# Patient Record
Sex: Female | Born: 1985 | Race: Black or African American | Hispanic: No | Marital: Single | Smoking: Former smoker
Health system: Southern US, Community
[De-identification: ages and names within clinical notes are randomized; demographics above are authoritative.]

## PROBLEM LIST (undated history)

## (undated) ENCOUNTER — Ambulatory Visit (HOSPITAL_COMMUNITY): Source: Home / Self Care

## (undated) DIAGNOSIS — B20 Human immunodeficiency virus [HIV] disease: Secondary | ICD-10-CM

## (undated) DIAGNOSIS — Z789 Other specified health status: Secondary | ICD-10-CM

## (undated) DIAGNOSIS — J40 Bronchitis, not specified as acute or chronic: Secondary | ICD-10-CM

## (undated) DIAGNOSIS — Z21 Asymptomatic human immunodeficiency virus [HIV] infection status: Secondary | ICD-10-CM

## (undated) DIAGNOSIS — J45909 Unspecified asthma, uncomplicated: Secondary | ICD-10-CM

## (undated) HISTORY — PX: DILATION AND CURETTAGE, DIAGNOSTIC / THERAPEUTIC: SUR384

## (undated) HISTORY — PX: NO PAST SURGERIES: SHX2092

---
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
## 2007-10-19 ENCOUNTER — Emergency Department (HOSPITAL_COMMUNITY): Admission: EM | Admit: 2007-10-19 | Discharge: 2007-10-19 | Payer: Self-pay | Admitting: Emergency Medicine

## 2008-05-04 ENCOUNTER — Emergency Department (HOSPITAL_COMMUNITY): Admission: EM | Admit: 2008-05-04 | Discharge: 2008-05-04 | Payer: Self-pay | Admitting: *Deleted

## 2008-10-28 ENCOUNTER — Emergency Department (HOSPITAL_COMMUNITY): Admission: EM | Admit: 2008-10-28 | Discharge: 2008-10-28 | Payer: Self-pay | Admitting: Emergency Medicine

## 2009-02-01 ENCOUNTER — Emergency Department (HOSPITAL_COMMUNITY): Admission: EM | Admit: 2009-02-01 | Discharge: 2009-02-02 | Payer: Self-pay | Admitting: Emergency Medicine

## 2009-03-10 ENCOUNTER — Emergency Department (HOSPITAL_COMMUNITY): Admission: EM | Admit: 2009-03-10 | Discharge: 2009-03-10 | Payer: Self-pay | Admitting: Emergency Medicine

## 2009-05-31 ENCOUNTER — Emergency Department (HOSPITAL_COMMUNITY): Admission: EM | Admit: 2009-05-31 | Discharge: 2009-05-31 | Payer: Self-pay | Admitting: Emergency Medicine

## 2009-09-06 ENCOUNTER — Emergency Department (HOSPITAL_COMMUNITY): Admission: EM | Admit: 2009-09-06 | Discharge: 2009-09-06 | Payer: Self-pay | Admitting: Emergency Medicine

## 2009-09-11 ENCOUNTER — Emergency Department (HOSPITAL_COMMUNITY): Admission: EM | Admit: 2009-09-11 | Discharge: 2009-09-12 | Payer: Self-pay | Admitting: Emergency Medicine

## 2010-02-26 ENCOUNTER — Emergency Department (HOSPITAL_COMMUNITY): Admission: EM | Admit: 2010-02-26 | Discharge: 2010-02-26 | Payer: Self-pay | Admitting: Emergency Medicine

## 2010-09-02 LAB — URINALYSIS, ROUTINE W REFLEX MICROSCOPIC
Bilirubin Urine: NEGATIVE
Glucose, UA: NEGATIVE mg/dL
Hgb urine dipstick: NEGATIVE
Ketones, ur: NEGATIVE mg/dL
Nitrite: NEGATIVE
Protein, ur: NEGATIVE mg/dL
Specific Gravity, Urine: 1.024 (ref 1.005–1.030)
Urobilinogen, UA: 0.2 mg/dL (ref 0.0–1.0)
pH: 7 (ref 5.0–8.0)

## 2010-09-02 LAB — POCT PREGNANCY, URINE: Preg Test, Ur: NEGATIVE

## 2010-09-21 LAB — POCT PREGNANCY, URINE: Preg Test, Ur: NEGATIVE

## 2010-09-24 LAB — MONONUCLEOSIS SCREEN: Mono Screen: POSITIVE — AB

## 2010-09-24 LAB — RAPID STREP SCREEN (MED CTR MEBANE ONLY): Streptococcus, Group A Screen (Direct): NEGATIVE

## 2010-09-25 LAB — URINE CULTURE: Colony Count: >=100000

## 2010-09-25 LAB — DIFFERENTIAL
Basophils Absolute: 0.1 10*3/uL (ref 0.0–0.1)
Basophils Relative: 1 % (ref 0–1)
Eosinophils Absolute: 0.3 10*3/uL (ref 0.0–0.7)
Eosinophils Relative: 4 % (ref 0–5)
Lymphocytes Relative: 25 % (ref 12–46)
Lymphs Abs: 1.6 10*3/uL (ref 0.7–4.0)
Monocytes Absolute: 0.8 10*3/uL (ref 0.1–1.0)
Monocytes Relative: 12 % (ref 3–12)
Neutro Abs: 3.8 10*3/uL (ref 1.7–7.7)
Neutrophils Relative %: 58 % (ref 43–77)

## 2010-09-25 LAB — COMPREHENSIVE METABOLIC PANEL
ALT: 11 U/L (ref 0–35)
AST: 20 U/L (ref 0–37)
Albumin: 4 g/dL (ref 3.5–5.2)
Alkaline Phosphatase: 59 U/L (ref 39–117)
BUN: 13 mg/dL (ref 6–23)
CO2: 26 mEq/L (ref 19–32)
Calcium: 9.6 mg/dL (ref 8.4–10.5)
Chloride: 105 mEq/L (ref 96–112)
Creatinine, Ser: 0.79 mg/dL (ref 0.4–1.2)
GFR calc Af Amer: >60 mL/min (ref >60)
GFR calc non Af Amer: >60 mL/min (ref >60)
Glucose, Bld: 71 mg/dL (ref 70–99)
Potassium: 4.5 mEq/L (ref 3.5–5.1)
Sodium: 137 mEq/L (ref 135–145)
Total Bilirubin: 0.6 mg/dL (ref 0.3–1.2)
Total Protein: 7.3 g/dL (ref 6.0–8.3)

## 2010-09-25 LAB — CBC
HCT: 40 % (ref 36.0–46.0)
Hemoglobin: 13.3 g/dL (ref 12.0–15.0)
MCHC: 33.3 g/dL (ref 30.0–36.0)
MCV: 90.2 fL (ref 78.0–100.0)
Platelets: 314 10*3/uL (ref 150–400)
RBC: 4.44 MIL/uL (ref 3.87–5.11)
RDW: 13.2 % (ref 11.5–15.5)
WBC: 6.6 10*3/uL (ref 4.0–10.5)

## 2010-09-25 LAB — POCT PREGNANCY, URINE: Preg Test, Ur: NEGATIVE

## 2010-09-25 LAB — URINALYSIS, ROUTINE W REFLEX MICROSCOPIC
Bilirubin Urine: NEGATIVE
Glucose, UA: NEGATIVE mg/dL
Hgb urine dipstick: NEGATIVE
Ketones, ur: NEGATIVE mg/dL
Leukocytes, UA: NEGATIVE
Nitrite: POSITIVE — AB
Protein, ur: NEGATIVE mg/dL
Specific Gravity, Urine: 1.025 (ref 1.005–1.030)
Urobilinogen, UA: 1 mg/dL (ref 0.0–1.0)
pH: 7.5 (ref 5.0–8.0)

## 2010-09-25 LAB — URINE MICROSCOPIC-ADD ON

## 2010-09-25 LAB — LIPASE, BLOOD: Lipase: 21 U/L (ref 11–59)

## 2010-10-02 ENCOUNTER — Emergency Department (HOSPITAL_COMMUNITY)
Admission: EM | Admit: 2010-10-02 | Discharge: 2010-10-02 | Disposition: A | Discharge status: Home / Self Care | Payer: Self-pay | Attending: Emergency Medicine | Admitting: Emergency Medicine

## 2010-10-02 DIAGNOSIS — J45909 Unspecified asthma, uncomplicated: Secondary | ICD-10-CM | POA: Insufficient documentation

## 2010-10-02 DIAGNOSIS — J309 Allergic rhinitis, unspecified: Secondary | ICD-10-CM | POA: Insufficient documentation

## 2010-10-02 DIAGNOSIS — I498 Other specified cardiac arrhythmias: Secondary | ICD-10-CM | POA: Insufficient documentation

## 2010-10-02 DIAGNOSIS — R0602 Shortness of breath: Secondary | ICD-10-CM | POA: Insufficient documentation

## 2011-03-22 LAB — COMPREHENSIVE METABOLIC PANEL
ALT: 10
AST: 15
Albumin: 3.6
Alkaline Phosphatase: 76
BUN: 10
CO2: 23
Calcium: 9.3
Chloride: 105
Creatinine, Ser: 0.71
GFR calc Af Amer: >60
GFR calc non Af Amer: >60
Glucose, Bld: 93
Potassium: 3.7
Sodium: 136
Total Bilirubin: 0.2 — ABNORMAL LOW
Total Protein: 7.6

## 2011-03-22 LAB — URINE CULTURE: Colony Count: >=100000

## 2011-03-22 LAB — URINALYSIS, ROUTINE W REFLEX MICROSCOPIC
Bilirubin Urine: NEGATIVE
Glucose, UA: NEGATIVE
Hgb urine dipstick: NEGATIVE
Ketones, ur: NEGATIVE
Nitrite: NEGATIVE
Protein, ur: NEGATIVE
Specific Gravity, Urine: 1.015
Urobilinogen, UA: 1
pH: 7

## 2011-03-22 LAB — LIPASE, BLOOD: Lipase: 23

## 2011-03-22 LAB — CBC
HCT: 37.9
Hemoglobin: 12.6
MCHC: 33.1
MCV: 88.1
Platelets: 409 — ABNORMAL HIGH
RBC: 4.31
RDW: 14.3
WBC: 9.3

## 2011-03-22 LAB — DIFFERENTIAL
Basophils Absolute: 0
Basophils Relative: 0
Eosinophils Absolute: 0.3
Eosinophils Relative: 3
Lymphocytes Relative: 21
Lymphs Abs: 2
Monocytes Absolute: 0.4
Monocytes Relative: 5
Neutro Abs: 6.6
Neutrophils Relative %: 71

## 2011-03-22 LAB — URINE MICROSCOPIC-ADD ON

## 2011-03-22 LAB — POCT PREGNANCY, URINE: Preg Test, Ur: NEGATIVE

## 2011-05-10 ENCOUNTER — Inpatient Hospital Stay (HOSPITAL_COMMUNITY)
Admission: AD | Admit: 2011-05-10 | Discharge: 2011-05-10 | Disposition: A | Discharge status: Home / Self Care | Payer: Self-pay | Source: Ambulatory Visit | Attending: Obstetrics & Gynecology | Admitting: Obstetrics & Gynecology

## 2011-05-10 ENCOUNTER — Encounter (HOSPITAL_COMMUNITY): Payer: Self-pay | Admitting: Obstetrics and Gynecology

## 2011-05-10 DIAGNOSIS — N949 Unspecified condition associated with female genital organs and menstrual cycle: Secondary | ICD-10-CM | POA: Insufficient documentation

## 2011-05-10 DIAGNOSIS — N390 Urinary tract infection, site not specified: Secondary | ICD-10-CM | POA: Insufficient documentation

## 2011-05-10 DIAGNOSIS — N898 Other specified noninflammatory disorders of vagina: Secondary | ICD-10-CM

## 2011-05-10 DIAGNOSIS — N938 Other specified abnormal uterine and vaginal bleeding: Secondary | ICD-10-CM | POA: Insufficient documentation

## 2011-05-10 DIAGNOSIS — N939 Abnormal uterine and vaginal bleeding, unspecified: Secondary | ICD-10-CM

## 2011-05-10 HISTORY — DX: Other specified health status: Z78.9

## 2011-05-10 LAB — URINALYSIS, ROUTINE W REFLEX MICROSCOPIC
Glucose, UA: NEGATIVE mg/dL
Ketones, ur: NEGATIVE mg/dL
Leukocytes, UA: NEGATIVE
Nitrite: POSITIVE — AB
Protein, ur: 100 mg/dL — AB
Specific Gravity, Urine: >1.03 — ABNORMAL HIGH (ref 1.005–1.030)
Urobilinogen, UA: 1 mg/dL (ref 0.0–1.0)
pH: 5.5 (ref 5.0–8.0)

## 2011-05-10 LAB — CBC
HCT: 40.2 % (ref 36.0–46.0)
Hemoglobin: 13.4 g/dL (ref 12.0–15.0)
MCH: 28.6 pg (ref 26.0–34.0)
MCHC: 33.3 g/dL (ref 30.0–36.0)
MCV: 85.7 fL (ref 78.0–100.0)
Platelets: 270 10*3/uL (ref 150–400)
RBC: 4.69 MIL/uL (ref 3.87–5.11)
RDW: 13.6 % (ref 11.5–15.5)
WBC: 5.4 10*3/uL (ref 4.0–10.5)

## 2011-05-10 LAB — HCG, SERUM, QUALITATIVE: Preg, Serum: NEGATIVE

## 2011-05-10 LAB — WET PREP, GENITAL
Clue Cells Wet Prep HPF POC: NONE SEEN
Trich, Wet Prep: NONE SEEN
Yeast Wet Prep HPF POC: NONE SEEN

## 2011-05-10 LAB — POCT PREGNANCY, URINE: Preg Test, Ur: NEGATIVE

## 2011-05-10 LAB — URINE MICROSCOPIC-ADD ON

## 2011-05-10 MED ORDER — NITROFURANTOIN MONOHYD MACRO 100 MG PO CAPS: 100.0000 mg | ORAL_CAPSULE | Freq: Two times a day (BID) | ORAL | Status: AC

## 2011-05-10 MED ORDER — KETOROLAC TROMETHAMINE 60 MG/2ML IM SOLN
60.0000 mg | Freq: Once | INTRAMUSCULAR | Status: AC
  Administered 2011-05-10: 60 mg via INTRAMUSCULAR
  Filled 2011-05-10: qty 2

## 2011-05-10 MED ORDER — NORGESTIMATE-ETH ESTRADIOL 0.25-35 MG-MCG PO TABS: 1.0000 | ORAL_TABLET | Freq: Every day | ORAL | Status: DC

## 2011-05-10 NOTE — ED Provider Notes (Signed)
History     Chief Complaint  Patient presents with  . Vaginal Bleeding   Patient is a 25 y.o. female presenting with vaginal bleeding. The history is provided by the patient.  Vaginal Bleeding This is a new problem. The current episode started today. Associated symptoms include abdominal pain. Pertinent negatives include no chills, fever, headaches or nausea.  Pt had PNMP 10/1 lasting 4 days and then started bleeding heavily this morning when woke up this morning bleeding through pajamas and pads changing every hour with clots and cramping at onset of period.  She has not taken any medications for the pain.  She is not using anything for birth control except condoms.  [facility]  She wants birth control but previously used BCP and missed one pill and got pregnant.      Past Medical History  Diagnosis Date  . No pertinent past medical history     Past Surgical History  Procedure Date  . No past surgeries     No family history on file.  History  Substance Use Topics  . Smoking status: Current Everyday Smoker -- 0.5 packs/day  . Smokeless tobacco: Not on file  . Alcohol Use: No    Allergies: Allergies not on file  No prescriptions prior to admission    Review of Systems  Constitutional: Negative for fever and chills.  Gastrointestinal: Positive for abdominal pain. Negative for heartburn and nausea.  Genitourinary: Positive for vaginal bleeding. Negative for dysuria, urgency and frequency.  Neurological: Negative for headaches.   Physical Exam   Blood pressure 108/55, pulse 76, temperature 98.8 F (37.1 C), temperature source Oral, resp. rate 20, height 5\' 6"  (1.676 m), weight 176 lb (79.833 kg), last menstrual period 03/25/2011.  Physical Exam  Nursing note and vitals reviewed. Constitutional: She is oriented to person, place, and time. She appears well-developed and well-nourished.  HENT:  Head: Normocephalic.  Eyes:  Pupils are equal, round, and reactive to light.  Neck: Normal range of motion. Neck supple.  Cardiovascular: Normal rate.   Respiratory: Effort normal.  GI: Soft. She exhibits no distension. There is no tenderness. There is no rebound and no guarding.  Genitourinary:       Mod amount of blood in vault; cervix clean NT; adnexal without palpable enlargement or tenderness  Musculoskeletal: Normal range of motion.  Neurological: She is alert and oriented to person, place, and time. She has normal reflexes.  Skin: Skin is warm and dry.    MAU Course  Procedures Results for orders placed during the hospital encounter of 05/10/11 (from the past 24 hour(s))  URINALYSIS, ROUTINE W REFLEX MICROSCOPIC     Status: Abnormal   Collection Time   05/10/11 12:20 PM      Component Value Range   Color, Urine RED (*) YELLOW    Appearance CLOUDY (*) CLEAR    Specific Gravity, Urine >1.030 (*) 1.005 - 1.030    pH 5.5  5.0 - 8.0    Glucose, UA NEGATIVE  NEGATIVE (mg/dL)   Hgb urine dipstick LARGE (*) NEGATIVE    Bilirubin Urine SMALL (*) NEGATIVE    Ketones, ur NEGATIVE  NEGATIVE (mg/dL)   Protein, ur 161 (*) NEGATIVE (mg/dL)   Urobilinogen, UA 1.0  0.0 - 1.0 (mg/dL)   Nitrite POSITIVE (*) NEGATIVE    Leukocytes, UA NEGATIVE  NEGATIVE   URINE MICROSCOPIC-ADD ON     Status: Abnormal   Collection  Time   05/10/11 12:20 PM      Component Value Range   Squamous Epithelial / LPF FEW (*) RARE    RBC / HPF TOO NUMEROUS TO COUNT  <3 (RBC/hpf)  POCT PREGNANCY, URINE     Status: Normal   Collection Time   05/10/11 12:48 PM      Component Value Range   Preg Test, Ur NEGATIVE    CBC     Status: Normal   Collection Time   05/10/11  1:43 PM      Component Value Range   WBC 5.4  4.0 - 10.5 (K/uL)   RBC 4.69  3.87 - 5.11 (MIL/uL)   Hemoglobin 13.4  12.0 - 15.0 (g/dL)   HCT 45.4  09.8 - 11.9 (%)   MCV 85.7  78.0 - 100.0 (fL)   MCH 28.6  26.0 - 34.0 (pg)   MCHC 33.3  30.0 - 36.0 (g/dL)   RDW 14.7  82.9 -  56.2 (%)   Platelets 270  150 - 400 (K/uL)  HCG, SERUM, QUALITATIVE     Status: Normal   Collection Time   05/10/11  1:43 PM      Component Value Range   Preg, Serum NEGATIVE  NEGATIVE   WET PREP, GENITAL     Status: Abnormal   Collection Time   05/10/11  2:53 PM      Component Value Range   Yeast, Wet Prep NONE SEEN  NONE SEEN    Trich, Wet Prep NONE SEEN  NONE SEEN    Clue Cells, Wet Prep NONE SEEN  NONE SEEN    WBC, Wet Prep HPF POC FEW (*) NONE SEEN    GC/chlamydia- pending  Assessment and Plan  Pelvic pain Abnormal uterine bleeding- start BCP  UTI- MAcrobid prescription  Jovi Zavadil 05/10/2011, 1:28 PM

## 2011-05-10 NOTE — Progress Notes (Signed)
Pt presents to MAU with chief complaint of vaginal bleeding that started this morning. LMP 10/10- normal menses, pt is currently sexually active. Did not start period in Nov; currently under a lot of stress. Pt states she soaked about 8 pads since 9:00 am this morning, and that's what made her seek care.

## 2011-05-10 NOTE — Progress Notes (Signed)
Pt states, " I had spotting on Sunday and Monday then I work up at 9 am with blood all over my bed, my pajamas, my panties and it was running down my legs. I was cramping real bad.I missed my period which was due Nov 5 th."

## 2011-05-11 LAB — GC/CHLAMYDIA PROBE AMP, GENITAL
Chlamydia, DNA Probe: NEGATIVE
GC Probe Amp, Genital: POSITIVE — AB

## 2011-10-18 ENCOUNTER — Ambulatory Visit: Payer: Self-pay

## 2011-10-20 ENCOUNTER — Ambulatory Visit: Payer: Self-pay

## 2011-11-13 ENCOUNTER — Encounter (HOSPITAL_COMMUNITY): Payer: Self-pay | Admitting: Emergency Medicine

## 2011-11-13 ENCOUNTER — Emergency Department (HOSPITAL_COMMUNITY)
Admission: EM | Admit: 2011-11-13 | Discharge: 2011-11-14 | Disposition: A | Discharge status: Home / Self Care | Payer: Self-pay | Attending: Emergency Medicine | Admitting: Emergency Medicine

## 2011-11-13 DIAGNOSIS — L02213 Cutaneous abscess of chest wall: Secondary | ICD-10-CM

## 2011-11-13 DIAGNOSIS — R21 Rash and other nonspecific skin eruption: Secondary | ICD-10-CM

## 2011-11-13 DIAGNOSIS — Z21 Asymptomatic human immunodeficiency virus [HIV] infection status: Secondary | ICD-10-CM | POA: Insufficient documentation

## 2011-11-13 DIAGNOSIS — L2989 Other pruritus: Secondary | ICD-10-CM | POA: Insufficient documentation

## 2011-11-13 DIAGNOSIS — L298 Other pruritus: Secondary | ICD-10-CM | POA: Insufficient documentation

## 2011-11-13 DIAGNOSIS — L02219 Cutaneous abscess of trunk, unspecified: Secondary | ICD-10-CM | POA: Insufficient documentation

## 2011-11-13 HISTORY — DX: Human immunodeficiency virus (HIV) disease: B20

## 2011-11-13 HISTORY — DX: Asymptomatic human immunodeficiency virus (hiv) infection status: Z21

## 2011-11-13 NOTE — ED Notes (Signed)
Pt found out she was HIV positive April 15th and 3 weeks ago she started having bumps all over her chest and face. One bump in particular is on her R chest area and 'popped' last night. Area is draining yellow pus.

## 2011-11-14 MED ORDER — DOXYCYCLINE HYCLATE 100 MG PO CAPS: 100.0000 mg | ORAL_CAPSULE | Freq: Two times a day (BID) | ORAL | Status: AC

## 2011-11-14 MED ORDER — DIPHENHYDRAMINE HCL 25 MG PO CAPS
25.0000 mg | ORAL_CAPSULE | Freq: Once | ORAL | Status: AC
  Administered 2011-11-14: 25 mg via ORAL
  Filled 2011-11-14: qty 1

## 2011-11-14 MED ORDER — HYDROCODONE-ACETAMINOPHEN 5-325 MG PO TABS: 2.0000 | ORAL_TABLET | Freq: Four times a day (QID) | ORAL | Status: DC | PRN

## 2011-11-14 MED ORDER — DOXYCYCLINE HYCLATE 100 MG PO TABS
100.0000 mg | ORAL_TABLET | Freq: Once | ORAL | Status: AC
  Administered 2011-11-14: 100 mg via ORAL
  Filled 2011-11-14: qty 1

## 2011-11-14 NOTE — Discharge Instructions (Signed)
Abscess Care After An abscess (also called a boil or furuncle) is an infected area that contains a collection of pus. Signs and symptoms of an abscess include pain, tenderness, redness, or hardness, or you may feel a moveable soft area under your skin. An abscess can occur anywhere in the body. The infection may spread to surrounding tissues causing cellulitis. A cut (incision) by the surgeon was made over your abscess and the pus was drained out. Gauze may have been packed into the space to provide a drain that will allow the cavity to heal from the inside outwards. The boil may be painful for 5 to 7 days. Most people with a boil do not have high fevers. Your abscess, if seen early, may not have localized, and may not have been lanced. If not, another appointment may be required for this if it does not get better on its own or with medications. HOME CARE INSTRUCTIONS   Only take over-the-counter or prescription medicines for pain, discomfort, or fever as directed by your caregiver.   When you bathe, soak and then remove gauze or iodoform packs at least daily or as directed by your caregiver. You may then wash the wound gently with mild soapy water. Repack with gauze or do as your caregiver directs.  SEEK IMMEDIATE MEDICAL CARE IF:   You develop increased pain, swelling, redness, drainage, or bleeding in the wound site.   You develop signs of generalized infection including muscle aches, chills, fever, or a general ill feeling.   An oral temperature above 102 F (38.9 C) develops, not controlled by medication.  See your caregiver for a recheck if you develop any of the symptoms described above. If medications (antibiotics) were prescribed, take them as directed. Document Released: 12/23/2004 Document Revised: 05/26/2011 Document Reviewed: 08/20/2007 [facility] Patient Information 2012 [place], [place].  Rash A rash is a change in the color or texture of your skin. There are many different types of  rashes. You may have other problems that accompany your rash. CAUSES   Infections.   Allergic reactions. This can include allergies to pets or foods.   Certain medicines.   Exposure to certain chemicals, soaps, or cosmetics.   Heat.   Exposure to poisonous plants.   Tumors, both cancerous and noncancerous.  SYMPTOMS   Redness.   Scaly skin.   Itchy skin.   Dry or cracked skin.   Bumps.   Blisters.   Pain.  DIAGNOSIS  Your caregiver may do a physical exam to determine what type of rash you have. A skin sample (biopsy) may be taken and examined under a microscope. TREATMENT  Treatment depends on the type of rash you have. Your caregiver may prescribe certain medicines. For serious conditions, you may need to see a skin doctor (dermatologist). HOME CARE INSTRUCTIONS   Avoid the substance that caused your rash.   Do not scratch your rash. This can cause infection.   You may take cool baths to help stop itching.   Only take over-the-counter or prescription medicines as directed by your caregiver.   Keep all follow-up appointments as directed by your caregiver.  SEEK IMMEDIATE MEDICAL CARE IF:  You have increasing pain, swelling, or redness.   You have a fever.   You have new or severe symptoms.   You have body aches, diarrhea, or vomiting.   Your rash is not better after 3 days.  MAKE SURE YOU:  Understand these instructions.   Will watch your condition.   Will  get help right away if you are not doing well or get worse.  Document Released: 05/27/2002 Document Revised: 05/26/2011 Document Reviewed: 03/21/2011 [facility] Patient Information 2012 [place], [place].

## 2011-11-14 NOTE — ED Provider Notes (Signed)
History     CSN: [id number]  Arrival date & time 11/13/11  2230   First MD Initiated Contact with Patient 11/14/11 0008      Chief Complaint  Patient presents with  . Skin Abcess     (Consider location/radiation/quality/duration/timing/severity/associated sxs/prior treatment) HPI  26 year old female with history of HIV recently diagnosed presents complaining of skin rash. Patient states for the past several weeks she has noticed bumps to the face, and to her mid chest. It is tender and occasionally itchy. She also noticed one bump to her mid chest that has been swollen and painful.  Bump popped this AM. She noticed pus drainage. Patient denies fever, chills, chest pain, shortness of breath, nausea, vomiting, diarrhea. She denies any new medication changes, detergent changes, or having new pets. She does not know her CD4 count. She has an appointment in 2 days to be seen by an infectious specialist. For itch she has been using Benadryl and hydrocortisone cream which has helped.  Past Medical History  Diagnosis Date  . No pertinent past medical history   . HIV (human immunodeficiency virus infection)     History reviewed. No pertinent past surgical history.  History reviewed. No pertinent family history.  History  Substance Use Topics  . Smoking status: Current Everyday Smoker -- 0.5 packs/day  . Smokeless tobacco: Not on file  . Alcohol Use: No    OB History    Grav Para Term Preterm Abortions TAB SAB Ect Mult Living   2 0   2 1 1    0      Review of Systems  All other systems reviewed and are negative.    Allergies  Review of patient's allergies indicates no known allergies.  Home Medications   Current Outpatient Rx  Name Route Sig Dispense Refill  . DIPHENHYDRAMINE HCL 25 MG PO CAPS Oral Take 25 mg by mouth every 6 (six) hours as needed.    Marland Kitchen HYDROCORTISONE 1 % EX CREA Topical Apply 1 application topically 2 (two) times daily.      BP 110/64  Pulse 81   Temp(Src) 98.9 F (37.2 C) (Oral)  SpO2 100%  LMP 11/02/2011  Physical Exam  Nursing note and vitals reviewed. Constitutional: She is oriented to person, place, and time. She appears well-developed and well-nourished. No distress.  HENT:  Head: Normocephalic and atraumatic.  Mouth/Throat: Oropharynx is clear and moist. No oropharyngeal exudate.  Eyes: Conjunctivae are normal. Right eye exhibits no discharge. Left eye exhibits no discharge. No scleral icterus.  Neck: Normal range of motion. Neck supple.  Cardiovascular: Normal rate and regular rhythm.   Pulmonary/Chest: Effort normal. She has no wheezes.  Abdominal: Soft. There is no tenderness.  Musculoskeletal: Normal range of motion.  Lymphadenopathy:    She has no cervical adenopathy.  Neurological: She is alert and oriented to person, place, and time.  Skin: Skin is warm. Rash noted.       Multiple maculopapular rash noted to her face and to mid chest. An area induration fluctuance with pus or drainage noted on the right upper chest. Tenderness on palpation.    ED Course  Procedures (including critical care time)  Labs Reviewed - No data to display No results found.   No diagnosis found.    MDM  Pt has acne-like rash to face and midchest with a dime-size abscess which is actively draining.  Will prescribe doxycycline.  Will encourage using warm compress to promote abscess drainage.  Pt will f/u with  infectious specialist on Tuesday for further management.  She is afebrile, nontoxic, and VSS.          Fayrene Helper, PA-C 11/14/11 0031

## 2011-11-15 ENCOUNTER — Other Ambulatory Visit: Payer: Self-pay | Admitting: Internal Medicine

## 2011-11-15 ENCOUNTER — Ambulatory Visit (INDEPENDENT_AMBULATORY_CARE_PROVIDER_SITE_OTHER): Payer: Self-pay

## 2011-11-15 DIAGNOSIS — Z23 Encounter for immunization: Secondary | ICD-10-CM

## 2011-11-15 DIAGNOSIS — L309 Dermatitis, unspecified: Secondary | ICD-10-CM

## 2011-11-15 DIAGNOSIS — B20 Human immunodeficiency virus [HIV] disease: Secondary | ICD-10-CM

## 2011-11-15 DIAGNOSIS — Z79899 Other long term (current) drug therapy: Secondary | ICD-10-CM

## 2011-11-15 DIAGNOSIS — Z113 Encounter for screening for infections with a predominantly sexual mode of transmission: Secondary | ICD-10-CM

## 2011-11-15 LAB — COMPLETE METABOLIC PANEL WITH GFR
ALT: 18 U/L (ref 0–35)
AST: 20 U/L (ref 0–37)
Albumin: 4.1 g/dL (ref 3.5–5.2)
Alkaline Phosphatase: 52 U/L (ref 39–117)
BUN: 13 mg/dL (ref 6–23)
CO2: 27 mEq/L (ref 19–32)
Calcium: 9.2 mg/dL (ref 8.4–10.5)
Chloride: 105 mEq/L (ref 96–112)
Creat: 0.72 mg/dL (ref 0.50–1.10)
GFR, Est African American: >89 mL/min
GFR, Est Non African American: >89 mL/min
Glucose, Bld: 74 mg/dL (ref 70–99)
Potassium: 4.5 mEq/L (ref 3.5–5.3)
Sodium: 137 mEq/L (ref 135–145)
Total Bilirubin: 0.2 mg/dL — ABNORMAL LOW (ref 0.3–1.2)
Total Protein: 7 g/dL (ref 6.0–8.3)

## 2011-11-15 LAB — CBC WITH DIFFERENTIAL/PLATELET
Basophils Absolute: 0.1 10*3/uL (ref 0.0–0.1)
Basophils Relative: 2 % — ABNORMAL HIGH (ref 0–1)
Eosinophils Absolute: 0.8 10*3/uL — ABNORMAL HIGH (ref 0.0–0.7)
Eosinophils Relative: 19 % — ABNORMAL HIGH (ref 0–5)
HCT: 38.6 % (ref 36.0–46.0)
Hemoglobin: 12.9 g/dL (ref 12.0–15.0)
Lymphocytes Relative: 39 % (ref 12–46)
Lymphs Abs: 1.6 10*3/uL (ref 0.7–4.0)
MCH: 29.3 pg (ref 26.0–34.0)
MCHC: 33.4 g/dL (ref 30.0–36.0)
MCV: 87.7 fL (ref 78.0–100.0)
Monocytes Absolute: 0.7 10*3/uL (ref 0.1–1.0)
Monocytes Relative: 17 % — ABNORMAL HIGH (ref 3–12)
Neutro Abs: 1 10*3/uL — ABNORMAL LOW (ref 1.7–7.7)
Neutrophils Relative %: 23 % — ABNORMAL LOW (ref 43–77)
Platelets: 288 10*3/uL (ref 150–400)
RBC: 4.4 MIL/uL (ref 3.87–5.11)
RDW: 13.3 % (ref 11.5–15.5)
WBC: 4.2 10*3/uL (ref 4.0–10.5)

## 2011-11-15 LAB — LIPID PANEL
Cholesterol: 151 mg/dL (ref 0–200)
HDL: 44 mg/dL (ref >39)
LDL Cholesterol: 95 mg/dL (ref 0–99)
Total CHOL/HDL Ratio: 3.4 Ratio
Triglycerides: 61 mg/dL (ref <150)
VLDL: 12 mg/dL (ref 0–40)

## 2011-11-15 LAB — HEPATITIS B SURFACE ANTIBODY,QUALITATIVE: Hep B S Ab: NEGATIVE

## 2011-11-15 LAB — HEPATITIS B SURFACE ANTIGEN: Hepatitis B Surface Ag: NEGATIVE

## 2011-11-15 LAB — HEPATITIS C ANTIBODY: HCV Ab: NEGATIVE

## 2011-11-16 LAB — URINALYSIS
Bilirubin Urine: NEGATIVE
Glucose, UA: NEGATIVE mg/dL
Hgb urine dipstick: NEGATIVE
Ketones, ur: NEGATIVE mg/dL
Leukocytes, UA: NEGATIVE
Nitrite: NEGATIVE
Protein, ur: NEGATIVE mg/dL
Specific Gravity, Urine: 1.025 (ref 1.005–1.030)
Urobilinogen, UA: 1 mg/dL (ref 0.0–1.0)
pH: 7 (ref 5.0–8.0)

## 2011-11-16 LAB — T-HELPER CELL (CD4) - (RCID CLINIC ONLY)
CD4 % Helper T Cell: 11 % — ABNORMAL LOW (ref 33–55)
CD4 T Cell Abs: 170 uL — ABNORMAL LOW (ref 400–2700)

## 2011-11-16 LAB — HIV-1 RNA ULTRAQUANT REFLEX TO GENTYP+
HIV 1 RNA Quant: 253686 copies/mL — ABNORMAL HIGH (ref <20)
HIV-1 RNA Quant, Log: 5.4 {Log} — ABNORMAL HIGH (ref <1.30)

## 2011-11-16 LAB — HEPATITIS B CORE ANTIBODY, TOTAL: Hep B Core Total Ab: NEGATIVE

## 2011-11-16 LAB — HEPATITIS A ANTIBODY, TOTAL: Hep A Total Ab: NEGATIVE

## 2011-11-16 LAB — RPR

## 2011-11-17 DIAGNOSIS — L309 Dermatitis, unspecified: Secondary | ICD-10-CM | POA: Insufficient documentation

## 2011-11-17 NOTE — Progress Notes (Signed)
Pt recently tested positive in April, 2013. Her boyfriend suddenly became ill and was admitted to [facility] in [place]. While he was there he tested positive for HIV.   Pt was tested the same day and her results were also positive.  She has not been in treatment for HIV and has never taken any medications.   Tomasita Morrow, RN , BSN

## 2011-11-18 ENCOUNTER — Telehealth: Payer: Self-pay | Admitting: *Deleted

## 2011-11-18 ENCOUNTER — Ambulatory Visit: Payer: Self-pay

## 2011-11-18 ENCOUNTER — Ambulatory Visit (INDEPENDENT_AMBULATORY_CARE_PROVIDER_SITE_OTHER): Payer: Self-pay | Admitting: Internal Medicine

## 2011-11-18 ENCOUNTER — Encounter: Payer: Self-pay | Admitting: Internal Medicine

## 2011-11-18 VITALS — BP 100/65 | HR 88 | Temp 98.5°F | Ht 66.0 in | Wt 163.0 lb

## 2011-11-18 DIAGNOSIS — Z21 Asymptomatic human immunodeficiency virus [HIV] infection status: Secondary | ICD-10-CM

## 2011-11-18 DIAGNOSIS — B20 Human immunodeficiency virus [HIV] disease: Secondary | ICD-10-CM

## 2011-11-18 MED ORDER — SULFAMETHOXAZOLE-TMP DS 800-160 MG PO TABS: 1.0000 | ORAL_TABLET | Freq: Every day | ORAL | Status: AC

## 2011-11-18 NOTE — Telephone Encounter (Signed)
Called patient and gave her phone # [phone] for ID clinic in [place], [place], [facility]. Wendall Mola CMA

## 2011-11-18 NOTE — Progress Notes (Signed)
HIV CLINIC INITIAL VISIT  RFV; follow up on intake Subjective:    Patient ID: Maria Nicholson, female    DOB: 1985/06/25, 26 y.o.   MRN: [id number]  HPI debie is a 25yo F with eczema who presents to clinic after recently being diagnosed with HIV on October 03, 2011.Her CD4 count is 170(11%)/VL 253,686, genotype pending. ARt naive. She lasted tested negative for HIV in Nov 2012. She states that her most recent partner that she had unprotected sex from Jan-April 2013, was hospitalized for HIV related illness who told her to get tested. She denies any recent illness, nor any flu like symptoms.She resides in [place] and is visiting her mother in [place]. She plans on staying in [place] and is planning to go back in 4-5 days.  She states that she is recently on Day #4 of doxycycline for outbreak/skin lesions to her chest, other than this , is not having any other medical problems.   Active Ambulatory Problems    Diagnosis Date Noted  . Eczema 11/17/2011   Resolved Ambulatory Problems    Diagnosis Date Noted  . No Resolved Ambulatory Problems   Past Medical History  Diagnosis Date  . No pertinent past medical history   . HIV (human immunodeficiency virus infection)   - txd for chlamydia in 2010  No Known Allergies   Meds: Doxycycline 100mg  BID (day#4/7) allevert PRN Benadryl PRN  History  Substance Use Topics  . Smoking status: Current Everyday Smoker -- 0.5 packs/day for 2 years    Types: Cigarettes  . Smokeless tobacco: Never Used   Comment: decreased from one pack daily     . Alcohol Use: No   family history includes Cancer in her maternal grandmother; Cancer (age of onset:45) in her father; and Cancer (age of onset:62) in her maternal aunt.  Review of Systems Review of Systems  Constitutional: Negative for fever, chills, diaphoresis, activity change, appetite change, fatigue and unexpected weight change.  HENT: Negative for congestion, sore throat, rhinorrhea, sneezing,  trouble swallowing and sinus pressure.  Eyes: Negative for photophobia and visual disturbance.  Respiratory: Negative for cough, chest tightness, shortness of breath, wheezing and stridor.  Cardiovascular: Negative for chest pain, palpitations and leg swelling.  Gastrointestinal: Negative for nausea, vomiting, abdominal pain, diarrhea, constipation, blood in stool, abdominal distention and anal bleeding.  Genitourinary: Negative for dysuria, hematuria, flank pain and difficulty urinating.  Musculoskeletal: Negative for myalgias, back pain, joint swelling, arthralgias and gait problem.  Skin: Negative for color change, pallor, rash and wound.  Neurological: Negative for dizziness, tremors, weakness and light-headedness.  Hematological: Negative for adenopathy. Does not bruise/bleed easily.  Psychiatric/Behavioral: Negative for behavioral problems, confusion, sleep disturbance, dysphoric mood, decreased concentration and agitation.    Objective:   Physical Exam  BP 100/65  Pulse 88  Temp(Src) 98.5 F (36.9 C) (Oral)  Ht 5\' 6"  (1.676 m)  Wt 163 lb (73.936 kg)  BMI 26.31 kg/m2  LMP 11/02/2011   General Appearance:    Alert, cooperative, no distress, appears stated age  Head:    Normocephalic, without obvious abnormality, atraumatic  Eyes:    PERRL, conjunctiva/corneas clear, EOM's intact,  Ears:    Normal TM's and external ear canals, both ears  Nose:   Nares normal, septum midline, mucosa normal, no drainage    or sinus tenderness  Throat:   Lips, mucosa, and tongue normal; teeth and gums normal  Neck:   Supple, symmetrical, trachea midline, no adenopathy;  Back:     Symmetric, no curvature, ROM normal, no CVA tenderness  Lungs:     Clear to auscultation bilaterally, respirations unlabored      Heart:    Regular rate and rhythm, S1 and S2 normal, no murmur, rub   or gallop     Abdomen:     Soft, non-tender, bowel sounds active all four quadrants,    no masses, no  organomegaly        Extremities:   Extremities normal, atraumatic, no cyanosis or edema  Pulses:   2+ and symmetric all extremities  Skin:   Numerous acne like lesions to her cheeks and forehead. She has scattered furuncles/carbuncles to chest, non-draining. Ppd skin test no induration  Lymph nodes:   Cervical, supraclavicular, and axillary nodes normal  Neurologic:   CNII-XII intact, normal strength, sensation and reflexes    throughout      Assessment & Plan:  HIV= appears to have recent seroconversion since she states that she previously tested negative in Nov 2012. Has had unprotected sex with HIV+ female from Jan-April 7th 2013, and found to be positive April 5th. No IVDU. Recommend to re-test cd4 count and viral load in 1 month to see if her current numbers are her set point. It is unusual to be this low for recent diagnosis < 1 yr.  OI proph= in the meantime, will get her started on bactrim 1 DS daily PCP proph.  Health maintenance= she has had pneumococcal vax. TB skin test negative read at this visit.  Dispo= will need to get her established with an HIV clinic in [facility] for follow-up. And get ADAP/ryan white application started. Since she is not a resident of [place], we are unable to start that process.

## 2011-11-19 NOTE — ED Provider Notes (Signed)
Medical screening examination/treatment/procedure(s) were performed by non-physician practitioner and as supervising physician I was immediately available for consultation/collaboration.  Shafiq Larch, MD 11/19/11 0828 

## 2011-11-21 ENCOUNTER — Ambulatory Visit: Payer: Self-pay

## 2011-11-23 LAB — HIV-1 GENOTYPR PLUS

## 2011-12-01 ENCOUNTER — Ambulatory Visit: Payer: Self-pay | Admitting: Internal Medicine

## 2011-12-02 ENCOUNTER — Telehealth: Payer: Self-pay | Admitting: Internal Medicine

## 2011-12-02 NOTE — Telephone Encounter (Signed)
Patient called stating that bactrim for pcp proph is making her quite ill. Nausea and poor appetite. i have asked her to stop. Will check in with her on Monday to see if she feels better. If she is still feeling poorly, will see her in clinic. Will change to dapsone

## 2011-12-12 ENCOUNTER — Telehealth: Payer: Self-pay | Admitting: *Deleted

## 2011-12-12 NOTE — Telephone Encounter (Signed)
Patient said she spoke with Dr. Drue Second last week and was told to stop her medication due to reaction.  She said she was told to call back this week and would be prescribed something else, asking that Dr. Drue Second call her at [phone]. Wendall Mola CMA

## 2011-12-13 ENCOUNTER — Telehealth: Payer: Self-pay | Admitting: Internal Medicine

## 2011-12-13 NOTE — Telephone Encounter (Signed)
Left message that we will try dapsone for pcp proph. Also, the last time she was here, she had planned on going back to [place]. i have asked her to call back to see if she has changed her mind and plan to stay here in [place], if so, need to get adap to start ART.

## 2011-12-15 ENCOUNTER — Telehealth: Payer: Self-pay | Admitting: *Deleted

## 2011-12-15 NOTE — Telephone Encounter (Signed)
[facility] faxed signed request for records.  Pt has transferred care to [place].  Records requested were faxed.

## 2011-12-15 NOTE — Telephone Encounter (Signed)
ROI received from [facility] requesting records on the pt.  Pt had only one MD visit and new pt labs drawn.  These were faxed to [facility] today.  [employer] form updated.

## 2014-04-21 ENCOUNTER — Encounter: Payer: Self-pay | Admitting: Internal Medicine

## 2016-09-11 ENCOUNTER — Encounter (HOSPITAL_COMMUNITY): Payer: Self-pay

## 2016-09-11 ENCOUNTER — Emergency Department (HOSPITAL_COMMUNITY): Payer: Self-pay

## 2016-09-11 ENCOUNTER — Emergency Department (HOSPITAL_COMMUNITY)
Admission: EM | Admit: 2016-09-11 | Discharge: 2016-09-11 | Disposition: A | Discharge status: Home / Self Care | Payer: Self-pay | Attending: Emergency Medicine | Admitting: Emergency Medicine

## 2016-09-11 DIAGNOSIS — J40 Bronchitis, not specified as acute or chronic: Secondary | ICD-10-CM | POA: Insufficient documentation

## 2016-09-11 DIAGNOSIS — F17210 Nicotine dependence, cigarettes, uncomplicated: Secondary | ICD-10-CM | POA: Insufficient documentation

## 2016-09-11 DIAGNOSIS — B20 Human immunodeficiency virus [HIV] disease: Secondary | ICD-10-CM | POA: Insufficient documentation

## 2016-09-11 MED ORDER — LEVOFLOXACIN 750 MG PO TABS
750.0000 mg | ORAL_TABLET | Freq: Once | ORAL | Status: AC
  Administered 2016-09-11: 750 mg via ORAL
  Filled 2016-09-11: qty 1

## 2016-09-11 MED ORDER — LEVOFLOXACIN 750 MG PO TABS: 750.0000 mg | ORAL_TABLET | Freq: Every day | ORAL | 0 refills | Status: AC

## 2016-09-11 MED ORDER — BENZONATATE 100 MG PO CAPS: 100.0000 mg | ORAL_CAPSULE | Freq: Three times a day (TID) | ORAL | 0 refills | Status: DC

## 2016-09-11 NOTE — ED Notes (Signed)
E-signature broken. Pt denies questions about dc instructions.

## 2016-09-11 NOTE — ED Notes (Signed)
See EDP assessment 

## 2016-09-11 NOTE — ED Provider Notes (Signed)
[facility] Provider Note   CSN: [id number] Arrival date & time: 09/11/16  2154   By signing my name below, I, Soijett Blue, attest that this documentation has been prepared under the direction and in the presence of Shary Decamp, PA-C Electronically Signed: Soijett Blue, ED Scribe. 09/11/16. 10:23 PM.  History   Chief Complaint Chief Complaint  Patient presents with  . Emesis  . Cough    HPI Maria Nicholson is a 31 y.o. female with a PMHx of HIV, who presents to the Emergency Department complaining of post-tussive emesis onset this morning. Pt reports associated nausea, diarrhea, productive cough, nasal congestion, post-nasal drip, rhinorrhea, and sore throat. Pt has tried OTC tussin and cough drops with no relief of her symptoms. Denies fever, ear pain, and any other symptoms. Pt last CD4 count was 400 and viral load undetectable in January. She reports that her next follow up appointment is on 09/29/2016. Pt states that she lives in [place] and is in [place] visiting family. Denies allergies to medications.   The history is provided by the patient. No language interpreter was used.    Past Medical History:  Diagnosis Date  . HIV (human immunodeficiency virus infection) ([place])   . No pertinent past medical history     Patient Active Problem List   Diagnosis Date Noted  . Eczema 11/17/2011    History reviewed. No pertinent surgical history.  OB History    Gravida Para Term Preterm AB Living   2 0     2 0   SAB TAB Ectopic Multiple Live Births   1 1             Home Medications    Prior to Admission medications   Medication Sig Start Date End Date Taking? Authorizing Provider  diphenhydrAMINE (BENADRYL) 25 mg capsule Take 25 mg by mouth every 6 (six) hours as needed.    Historical Provider, MD  hydrocortisone cream 1 % Apply 1 application topically 2 (two) times daily.    Historical Provider, MD    Family History Family History  Problem Relation Age of Onset  .  Cancer Maternal Grandmother     throat  . Cancer Father 6    spinal   . Cancer Maternal Aunt 6    throat    Social History Social History  Substance Use Topics  . Smoking status: Current Every Day Smoker    Packs/day: 0.50    Years: 2.00    Types: Cigarettes  . Smokeless tobacco: Never Used     Comment: decreased from one pack daily     . Alcohol use No     Allergies   Patient has no known allergies.   Review of Systems Review of Systems  Constitutional: Negative for fever.  HENT: Positive for congestion, postnasal drip, rhinorrhea and sore throat. Negative for ear pain.   Respiratory: Positive for cough (productive).   Gastrointestinal: Positive for diarrhea, nausea and vomiting (post-tussive).   Physical Exam Updated Vital Signs BP 106/61   Pulse 91   Temp 98.8 F (37.1 C)   Resp 16   LMP 09/09/2016 (Exact Date)   SpO2 97%   Physical Exam  Constitutional: She is oriented to person, place, and time. Vital signs are normal. She appears well-developed and well-nourished. No distress.  HENT:  Head: Normocephalic and atraumatic.  Right Ear: Hearing, tympanic membrane, external ear and ear canal normal.  Left Ear: Hearing, tympanic membrane, external ear and ear canal normal.  Nose: Nose normal.  Mouth/Throat: Uvula is midline and mucous membranes are normal. No trismus in the jaw. Posterior oropharyngeal erythema present. No oropharyngeal exudate or tonsillar abscesses.  Eyes: Conjunctivae and EOM are normal. Pupils are equal, round, and reactive to light.  Neck: Normal range of motion. Neck supple. No tracheal deviation present.  Cardiovascular: Normal rate, regular rhythm, S1 normal, S2 normal, normal heart sounds, intact distal pulses and normal pulses.  Exam reveals no gallop and no friction rub.   No murmur heard. Pulmonary/Chest: Effort normal and breath sounds normal. No respiratory distress. She has no decreased breath sounds. She has no wheezes. She has  no rhonchi. She has no rales.  Abdominal: Normal appearance and bowel sounds are normal. She exhibits no distension. There is no tenderness.  Musculoskeletal: Normal range of motion.  Neurological: She is alert and oriented to person, place, and time.  Skin: Skin is warm and dry.  Psychiatric: She has a normal mood and affect. Her speech is normal and behavior is normal. Thought content normal.  Nursing note and vitals reviewed.  ED Treatments / Results  DIAGNOSTIC STUDIES: Oxygen Saturation is 97% on RA, nl by my interpretation.    COORDINATION OF CARE: 10:20 PM Discussed treatment plan with pt at bedside which includes CXR and pt agreed to plan.   Radiology Dg Chest 2 View  Result Date: 09/11/2016 CLINICAL DATA:  Cough. EXAM: CHEST  2 VIEW COMPARISON:  None. FINDINGS: The heart size and mediastinal contours are within normal limits. Both lungs are clear. No pneumothorax or pleural effusion is noted. The visualized skeletal structures are unremarkable. IMPRESSION: No active cardiopulmonary disease. Electronically Signed   By: Marijo Conception, M.D.   On: 09/11/2016 22:50    Procedures Procedures (including critical care time)  Medications Ordered in ED Medications - No data to display   Initial Impression / Assessment and Plan / ED Course  I have reviewed the triage vital signs and the nursing notes.  Pertinent imaging results that were available during my care of the patient were reviewed by me and considered in my medical decision making (see chart for details).  I have reviewed and evaluated the relevant imaging studies. I have interpreted the relevant EKG. I have reviewed the relevant previous healthcare records. I obtained HPI from historian.  ED Course:  Assessment: Pt is a 30 y.o. female presents with cough today. No fever. On exam, pt in NAD. VSS. Afebrile. Lungs CTA, Heart RRR. Abdomen nontender/soft. Pt CXR negative for acute infiltrate. Patients symptoms are  consistent with URI. Given hx HIV. Will Rx ABX and close follow up to PCP. Unlikely Flu. No fever. No tachycardia. Pt will be discharged with symptomatic treatment.  Verbalizes understanding and is agreeable with plan. Pt is hemodynamically stable & in NAD prior to dc.  Disposition/Plan:  DC Home Additional Verbal discharge instructions given and discussed with patient.  Pt Instructed to f/u with PCP in the next week for evaluation and treatment of symptoms. Return precautions given Pt acknowledges and agrees with plan  Supervising Physician Forde Dandy, MD   Final Clinical Impressions(s) / ED Diagnoses   Final diagnoses:  Bronchitis  HIV (human immunodeficiency virus infection) ([place])    New Prescriptions New Prescriptions   No medications on file   I personally performed the services described in this documentation, which was scribed in my presence. The recorded information has been reviewed and is accurate.    Shary Decamp, PA-C 09/11/16 2256  Forde Dandy, MD 09/12/16 [phone]

## 2016-09-11 NOTE — Discharge Instructions (Signed)
Please read and follow all provided instructions.  Your diagnoses today include:  1. Bronchitis   2. HIV (human immunodeficiency virus infection) ([place])     Tests performed today include: Vital signs. See below for your results today.   Medications prescribed:  Take as prescribed   Home care instructions:  Follow any educational materials contained in this packet.  Follow-up instructions: Please follow-up with your primary care provider for further evaluation of symptoms and treatment   Return instructions:  Please return to the Emergency Department if you do not get better, if you get worse, or new symptoms OR  - Fever (temperature greater than 101.109F)  - Bleeding that does not stop with holding pressure to the area    -Severe pain (please note that you may be more sore the day after your accident)  - Chest Pain  - Difficulty breathing  - Severe nausea or vomiting  - Inability to tolerate food and liquids  - Passing out  - Skin becoming red around your wounds  - Change in mental status (confusion or lethargy)  - New numbness or weakness    Please return if you have any other emergent concerns.  Additional Information:  Your vital signs today were: BP 106/61    Pulse 91    Temp 98.8 F (37.1 C)    Resp 16    LMP 09/09/2016 (Exact Date)    SpO2 97%  If your blood pressure (BP) was elevated above 135/85 this visit, please have this repeated by your doctor within one month. ---------------

## 2016-09-11 NOTE — ED Triage Notes (Signed)
Pt complaining of nasal congestion and cough x 1 day. Pt states 3 episodes of emesis today.

## 2017-01-20 ENCOUNTER — Encounter (HOSPITAL_COMMUNITY): Payer: Self-pay | Admitting: *Deleted

## 2017-01-20 ENCOUNTER — Emergency Department (HOSPITAL_COMMUNITY)
Admission: EM | Admit: 2017-01-20 | Discharge: 2017-01-20 | Disposition: A | Discharge status: Home / Self Care | Payer: Self-pay | Attending: Emergency Medicine | Admitting: Emergency Medicine

## 2017-01-20 DIAGNOSIS — G43909 Migraine, unspecified, not intractable, without status migrainosus: Secondary | ICD-10-CM | POA: Insufficient documentation

## 2017-01-20 DIAGNOSIS — F17210 Nicotine dependence, cigarettes, uncomplicated: Secondary | ICD-10-CM | POA: Insufficient documentation

## 2017-01-20 DIAGNOSIS — Z79899 Other long term (current) drug therapy: Secondary | ICD-10-CM | POA: Insufficient documentation

## 2017-01-20 LAB — I-STAT CHEM 8, ED
BUN: 17 mg/dL (ref 6–20)
Calcium, Ion: 1.16 mmol/L (ref 1.15–1.40)
Chloride: 105 mmol/L (ref 101–111)
Creatinine, Ser: 0.8 mg/dL (ref 0.44–1.00)
Glucose, Bld: 94 mg/dL (ref 65–99)
HCT: 39 % (ref 36.0–46.0)
Hemoglobin: 13.3 g/dL (ref 12.0–15.0)
Potassium: 4 mmol/L (ref 3.5–5.1)
Sodium: 139 mmol/L (ref 135–145)
TCO2: 24 mmol/L (ref 0–100)

## 2017-01-20 LAB — I-STAT BETA HCG BLOOD, ED (MC, WL, AP ONLY): I-stat hCG, quantitative: <5 m[IU]/mL (ref <5)

## 2017-01-20 MED ORDER — PROCHLORPERAZINE EDISYLATE 5 MG/ML IJ SOLN
10.0000 mg | Freq: Once | INTRAMUSCULAR | Status: AC
  Administered 2017-01-20: 10 mg via INTRAVENOUS
  Filled 2017-01-20: qty 2

## 2017-01-20 MED ORDER — SODIUM CHLORIDE 0.9 % IV BOLUS (SEPSIS)
1000.0000 mL | Freq: Once | INTRAVENOUS | Status: AC
  Administered 2017-01-20: 1000 mL via INTRAVENOUS

## 2017-01-20 MED ORDER — KETOROLAC TROMETHAMINE 30 MG/ML IJ SOLN
30.0000 mg | Freq: Once | INTRAMUSCULAR | Status: AC
  Administered 2017-01-20: 30 mg via INTRAVENOUS
  Filled 2017-01-20: qty 1

## 2017-01-20 MED ORDER — NAPROXEN 375 MG PO TABS: 375.0000 mg | ORAL_TABLET | Freq: Two times a day (BID) | ORAL | 0 refills | Status: DC

## 2017-01-20 NOTE — ED Provider Notes (Signed)
[facility] DEPT Provider Note   CSN: [id number] Arrival date & time: 01/20/17  1055     History   Chief Complaint Chief Complaint  Patient presents with  . Headache    HPI Maria Nicholson is a 31 y.o. female.  HPI Patient presents to the emergency room for evaluation of a headache. Patient states she's had a mild headache for the last few days. The light seems to bother her eyes. She has also felt somewhat lightheaded. She denies any fevers or chills. No neck pain. No confusion. No recent injuries. No nausea or vomiting. Patient was at work today when she had the persistent headache.  She was unable to work so she came to the emergency room for evaluation.  CD4 count in jan was 400 and viral load undetectable per med records. Past Medical History:  Diagnosis Date  . HIV (human immunodeficiency virus infection) ([place])   . No pertinent past medical history     Patient Active Problem List   Diagnosis Date Noted  . Eczema 11/17/2011    History reviewed. No pertinent surgical history.  OB History    Gravida Para Term Preterm AB Living   2 0     2 0   SAB TAB Ectopic Multiple Live Births   1 1             Home Medications    Prior to Admission medications   Medication Sig Start Date End Date Taking? Authorizing Provider  benzonatate (TESSALON) 100 MG capsule Take 1 capsule (100 mg total) by mouth every 8 (eight) hours. 09/11/16   Shary Decamp, PA-C  diphenhydrAMINE (BENADRYL) 25 mg capsule Take 25 mg by mouth every 6 (six) hours as needed.    [provider]  hydrocortisone cream 1 % Apply 1 application topically 2 (two) times daily.    [provider]  naproxen (NAPROSYN) 375 MG tablet Take 1 tablet (375 mg total) by mouth 2 (two) times daily. 01/20/17   Dorie Rank, MD    Family History Family History  Problem Relation Age of Onset  . Cancer Maternal Grandmother        throat  . Cancer Father 11       spinal   . Cancer Maternal Aunt 11   throat    Social History Social History  Substance Use Topics  . Smoking status: Current Every Day Smoker    Packs/day: 0.50    Years: 2.00    Types: Cigarettes  . Smokeless tobacco: Never Used     Comment: decreased from one pack daily     . Alcohol use No     Allergies   Patient has no known allergies.   Review of Systems Review of Systems  All other systems reviewed and are negative.    Physical Exam Updated Vital Signs BP 104/66   Pulse 78   Temp 98 F (36.7 C) (Oral)   Resp (!) 22   Ht 1.702 m (5\' 7" )   Wt 99.8 kg (220 lb)   LMP 01/09/2017   SpO2 100%   BMI 34.46 kg/m   Physical Exam  Constitutional: She appears well-developed and well-nourished. No distress.  HENT:  Head: Normocephalic and atraumatic.  Right Ear: External ear normal.  Left Ear: External ear normal.  Eyes: Conjunctivae are normal. Right eye exhibits no discharge. Left eye exhibits no discharge. No scleral icterus.  Neck: Normal range of motion. Neck supple. No tracheal deviation present.  Cardiovascular: Normal rate, regular  rhythm and intact distal pulses.   Pulmonary/Chest: Effort normal and breath sounds normal. No stridor. No respiratory distress. She has no wheezes. She has no rales.  Abdominal: Soft. Bowel sounds are normal. She exhibits no distension. There is no tenderness. There is no rebound and no guarding.  Musculoskeletal: She exhibits no edema or tenderness.  Neurological: She is alert. She has normal strength. No cranial nerve deficit (no facial droop, extraocular movements intact, no slurred speech) or sensory deficit. She exhibits normal muscle tone. She displays no seizure activity. Coordination normal.  Skin: Skin is warm and dry. No rash noted.  Psychiatric: She has a normal mood and affect.  Nursing note and vitals reviewed.    ED Treatments / Results  Labs (all labs ordered are listed, but only abnormal results are displayed) Labs Reviewed  I-STAT BETA HCG  BLOOD, ED ([facility], [facility], [facility] ONLY)  I-STAT CHEM 8, ED    Radiology No results found.  Procedures Procedures (including critical care time)  Medications Ordered in ED Medications  sodium chloride 0.9 % bolus 1,000 mL (0 mLs Intravenous Stopped 01/20/17 1204)  prochlorperazine (COMPAZINE) injection 10 mg (10 mg Intravenous Given 01/20/17 1142)  ketorolac (TORADOL) 30 MG/ML injection 30 mg (30 mg Intravenous Given 01/20/17 1142)     Initial Impression / Assessment and Plan / ED Course  I have reviewed the triage vital signs and the nursing notes.  Pertinent labs & imaging results that were available during my care of the patient were reviewed by me and considered in my medical decision making (see chart for details).  Clinical Course as of Jan 20 1217  Fri Jan 20, 2017  1202 Pt was given compazine.  States she is ready to go home.  She does not think it is related to the medication.  I explained the patient that sometimes with Compazine., Patient is can't get a side effect of akathisia and restlessness. Patient thinks that did not happen after the medication. She is just ready to go home and wants to get some rest  [JK]    Clinical Course User Index [JK] Dorie Rank, MD    I suspect the patient's having some migraine headache type symptoms. She does have a history of having this in the past. She is not having any abnormal neurologic findings. She does have HIV disease but according to the records it is well controlled. She does not have any meningismus or infectious symptoms..  Final Clinical Impressions(s) / ED Diagnoses   Final diagnoses:  Migraine without status migrainosus, not intractable, unspecified migraine type    New Prescriptions New Prescriptions   NAPROXEN (NAPROSYN) 375 MG TABLET    Take 1 tablet (375 mg total) by mouth 2 (two) times daily.     Dorie Rank, MD 01/20/17 1218

## 2017-01-20 NOTE — ED Triage Notes (Signed)
Pt reports having a headache x 4 days with sensitivity to light and feels lightheaded. Denies n/v.

## 2017-01-20 NOTE — Discharge Instructions (Signed)
Follow-up with primary care doctor next week if the symptoms have not resolved. Take medications as needed for your headache.

## 2017-03-26 ENCOUNTER — Encounter (HOSPITAL_COMMUNITY): Payer: Self-pay | Admitting: Emergency Medicine

## 2017-03-26 ENCOUNTER — Emergency Department (HOSPITAL_COMMUNITY)
Admission: EM | Admit: 2017-03-26 | Discharge: 2017-03-27 | Disposition: A | Discharge status: Home / Self Care | Payer: Self-pay | Attending: Emergency Medicine | Admitting: Emergency Medicine

## 2017-03-26 DIAGNOSIS — Z79899 Other long term (current) drug therapy: Secondary | ICD-10-CM | POA: Insufficient documentation

## 2017-03-26 DIAGNOSIS — F17210 Nicotine dependence, cigarettes, uncomplicated: Secondary | ICD-10-CM | POA: Insufficient documentation

## 2017-03-26 DIAGNOSIS — R110 Nausea: Secondary | ICD-10-CM

## 2017-03-26 DIAGNOSIS — R350 Frequency of micturition: Secondary | ICD-10-CM | POA: Insufficient documentation

## 2017-03-26 DIAGNOSIS — R112 Nausea with vomiting, unspecified: Secondary | ICD-10-CM | POA: Insufficient documentation

## 2017-03-26 DIAGNOSIS — Z21 Asymptomatic human immunodeficiency virus [HIV] infection status: Secondary | ICD-10-CM | POA: Insufficient documentation

## 2017-03-26 LAB — COMPREHENSIVE METABOLIC PANEL
ALT: 10 U/L — ABNORMAL LOW (ref 14–54)
AST: 16 U/L (ref 15–41)
Albumin: 3.5 g/dL (ref 3.5–5.0)
Alkaline Phosphatase: 57 U/L (ref 38–126)
Anion gap: 7 (ref 5–15)
BUN: 11 mg/dL (ref 6–20)
CO2: 24 mmol/L (ref 22–32)
Calcium: 8.8 mg/dL — ABNORMAL LOW (ref 8.9–10.3)
Chloride: 106 mmol/L (ref 101–111)
Creatinine, Ser: 0.95 mg/dL (ref 0.44–1.00)
GFR calc Af Amer: >60 mL/min (ref >60)
GFR calc non Af Amer: >60 mL/min (ref >60)
Glucose, Bld: 93 mg/dL (ref 65–99)
Potassium: 4 mmol/L (ref 3.5–5.1)
Sodium: 137 mmol/L (ref 135–145)
Total Bilirubin: 0.3 mg/dL (ref 0.3–1.2)
Total Protein: 7.1 g/dL (ref 6.5–8.1)

## 2017-03-26 LAB — CBC
HCT: 36.8 % (ref 36.0–46.0)
Hemoglobin: 12.2 g/dL (ref 12.0–15.0)
MCH: 30.8 pg (ref 26.0–34.0)
MCHC: 33.2 g/dL (ref 30.0–36.0)
MCV: 92.9 fL (ref 78.0–100.0)
Platelets: 309 10*3/uL (ref 150–400)
RBC: 3.96 MIL/uL (ref 3.87–5.11)
RDW: 13.6 % (ref 11.5–15.5)
WBC: 8.9 10*3/uL (ref 4.0–10.5)

## 2017-03-26 LAB — URINALYSIS, ROUTINE W REFLEX MICROSCOPIC
Bilirubin Urine: NEGATIVE
Glucose, UA: NEGATIVE mg/dL
Hgb urine dipstick: NEGATIVE
Ketones, ur: NEGATIVE mg/dL
Leukocytes, UA: NEGATIVE
Nitrite: NEGATIVE
Protein, ur: NEGATIVE mg/dL
Specific Gravity, Urine: 1.026 (ref 1.005–1.030)
pH: 6 (ref 5.0–8.0)

## 2017-03-26 LAB — I-STAT BETA HCG BLOOD, ED (MC, WL, AP ONLY): I-stat hCG, quantitative: <5 m[IU]/mL (ref <5)

## 2017-03-26 LAB — LIPASE, BLOOD: Lipase: 31 U/L (ref 11–51)

## 2017-03-26 MED ORDER — ONDANSETRON 4 MG PO TBDP
8.0000 mg | ORAL_TABLET | Freq: Once | ORAL | Status: AC
  Administered 2017-03-26: 8 mg via ORAL
  Filled 2017-03-26: qty 2

## 2017-03-26 NOTE — ED Provider Notes (Signed)
[facility] DEPT Provider Note   CSN: [id number] Arrival date & time: 03/26/17  2006     History   Chief Complaint Chief Complaint  Patient presents with  . Nausea  . Urinary Frequency    HPI Maria Nicholson is a 31 y.o. female.  Patient presents to the emergency department for evaluation of nausea and vomiting with urinary frequency. Patient reports that this has been ongoing for 2 weeks. Initially she thought she might just have a virus, but symptoms have continued. She has been increasing her oral intake to compensate, has had some vomiting associated with symptoms. She does not have abdominal pain. She has not had any fever.      Past Medical History:  Diagnosis Date  . HIV (human immunodeficiency virus infection) ([place])   . No pertinent past medical history     Patient Active Problem List   Diagnosis Date Noted  . Eczema 11/17/2011    History reviewed. No pertinent surgical history.  OB History    Gravida Para Term Preterm AB Living   2 0     2 0   SAB TAB Ectopic Multiple Live Births   1 1             Home Medications    Prior to Admission medications   Medication Sig Start Date End Date Taking? Authorizing Provider  abacavir-lamiVUDine (EPZICOM) 600-300 MG tablet Take 1 tablet by mouth daily.   Yes [provider]  darunavir-cobicistat (PREZCOBIX) 800-150 MG tablet Take 1 tablet by mouth daily with breakfast. Swallow whole. Do NOT crush, break or chew tablets. Take with food.   Yes [provider]  benzonatate (TESSALON) 100 MG capsule Take 1 capsule (100 mg total) by mouth every 8 (eight) hours. Patient not taking: Reported on 03/26/2017 09/11/16   Shary Decamp, PA-C  naproxen (NAPROSYN) 375 MG tablet Take 1 tablet (375 mg total) by mouth 2 (two) times daily. Patient not taking: Reported on 03/26/2017 01/20/17   Dorie Rank, MD  promethazine (PHENERGAN) 25 MG tablet Take 1 tablet (25 mg total) by mouth every 6 (six) hours as needed for  nausea or vomiting. 03/27/17   Orpah Greek, MD  ranitidine (ZANTAC) 150 MG tablet Take 1 tablet (150 mg total) by mouth 2 (two) times daily. 03/27/17   Orpah Greek, MD    Family History Family History  Problem Relation Age of Onset  . Cancer Maternal Grandmother        throat  . Cancer Father 74       spinal   . Cancer Maternal Aunt 52       throat    Social History Social History  Substance Use Topics  . Smoking status: Current Every Day Smoker    Packs/day: 0.50    Years: 2.00    Types: Cigarettes  . Smokeless tobacco: Never Used     Comment: decreased from one pack daily     . Alcohol use No     Allergies   Patient has no known allergies.   Review of Systems Review of Systems  Gastrointestinal: Positive for nausea and vomiting. Negative for constipation and diarrhea.  Genitourinary: Positive for frequency. Negative for dysuria.  All other systems reviewed and are negative.    Physical Exam Updated Vital Signs BP 111/69   Pulse 86   Temp 98.7 F (37.1 C) (Oral)   Resp 18   Ht 5\' 6"  (1.676 m)   LMP 02/20/2017   SpO2  100%   Physical Exam  Constitutional: She is oriented to person, place, and time. She appears well-developed and well-nourished. No distress.  HENT:  Head: Normocephalic and atraumatic.  Right Ear: Hearing normal.  Left Ear: Hearing normal.  Nose: Nose normal.  Mouth/Throat: Oropharynx is clear and moist and mucous membranes are normal.  Eyes: Pupils are equal, round, and reactive to light. Conjunctivae and EOM are normal.  Neck: Normal range of motion. Neck supple.  Cardiovascular: Regular rhythm, S1 normal and S2 normal.  Exam reveals no gallop and no friction rub.   No murmur heard. Pulmonary/Chest: Effort normal and breath sounds normal. No respiratory distress. She exhibits no tenderness.  Abdominal: Soft. Normal appearance and bowel sounds are normal. There is no hepatosplenomegaly. There is no tenderness. There is  no rebound, no guarding, no tenderness at McBurney's point and negative Murphy's sign. No hernia.  Musculoskeletal: Normal range of motion.  Neurological: She is alert and oriented to person, place, and time. She has normal strength. No cranial nerve deficit or sensory deficit. Coordination normal. GCS eye subscore is 4. GCS verbal subscore is 5. GCS motor subscore is 6.  Skin: Skin is warm, dry and intact. No rash noted. No cyanosis.  Psychiatric: She has a normal mood and affect. Her speech is normal and behavior is normal. Thought content normal.  Nursing note and vitals reviewed.    ED Treatments / Results  Labs (all labs ordered are listed, but only abnormal results are displayed) Labs Reviewed  COMPREHENSIVE METABOLIC PANEL - Abnormal; Notable for the following:       Result Value   Calcium 8.8 (*)    ALT 10 (*)    All other components within normal limits  URINALYSIS, ROUTINE W REFLEX MICROSCOPIC - Abnormal; Notable for the following:    APPearance HAZY (*)    All other components within normal limits  LIPASE, BLOOD  CBC  I-STAT BETA HCG BLOOD, ED ([facility], [facility], [facility] ONLY)    EKG  EKG Interpretation None       Radiology No results found.  Procedures Procedures (including critical care time)  Medications Ordered in ED Medications  ondansetron (ZOFRAN-ODT) disintegrating tablet 8 mg (8 mg Oral Given 03/26/17 2346)     Initial Impression / Assessment and Plan / ED Course  I have reviewed the triage vital signs and the nursing notes.  Pertinent labs & imaging results that were available during my care of the patient were reviewed by me and considered in my medical decision making (see chart for details).     Patient presents to the emergency department for evaluation of nausea with occasional intermittent vomiting. There is no associated abdominal pain. Patient reports that she has noticed that some foods cause her symptoms, but others do not. It is not always related to  eating. She does report a history of reflux in the past, but has not noticed anything that seemed like indigestion. No hematemesis or melena. Patient's abdominal exam is benign. There is no tenderness and she has not experienced abdominal pain. Lab work is entirely normal.  Patient administered Zofran here in the ER and has had resolution of her nausea. She is tolerating by mouth without difficulty. She feels much improved. We'll discharge with symptomatically treatment for reflux and nausea, follow-up with gastroenterology on-call.  Final Clinical Impressions(s) / ED Diagnoses   Final diagnoses:  Nausea    New Prescriptions New Prescriptions   PROMETHAZINE (PHENERGAN) 25 MG TABLET    Take 1  tablet (25 mg total) by mouth every 6 (six) hours as needed for nausea or vomiting.   RANITIDINE (ZANTAC) 150 MG TABLET    Take 1 tablet (150 mg total) by mouth 2 (two) times daily.     Orpah Greek, MD 03/27/17 [phone]

## 2017-03-26 NOTE — ED Triage Notes (Signed)
Pt c/o nausea/vomiting, vaginal discharge and urinary frequency x 3 days. Denies abdominal pain/diarrhea.

## 2017-03-27 ENCOUNTER — Encounter: Payer: Self-pay | Admitting: Gastroenterology

## 2017-03-27 MED ORDER — RANITIDINE HCL 150 MG PO TABS: 150.0000 mg | ORAL_TABLET | Freq: Two times a day (BID) | ORAL | 0 refills | Status: DC

## 2017-03-27 MED ORDER — PROMETHAZINE HCL 25 MG PO TABS: 25.0000 mg | ORAL_TABLET | Freq: Four times a day (QID) | ORAL | 0 refills | Status: DC | PRN

## 2017-03-27 NOTE — ED Notes (Signed)
Pt understood dc material. NAD noted. Scripts given at dc 

## 2017-03-27 NOTE — ED Notes (Signed)
Pt states the nausea is slowly getting better. RN offered PO Challenge and patient requested to wait a few more minutes

## 2017-04-05 ENCOUNTER — Ambulatory Visit: Payer: Self-pay | Admitting: Gastroenterology

## 2017-04-11 ENCOUNTER — Emergency Department (HOSPITAL_COMMUNITY): Payer: Self-pay

## 2017-04-11 ENCOUNTER — Emergency Department (HOSPITAL_COMMUNITY)
Admission: EM | Admit: 2017-04-11 | Discharge: 2017-04-11 | Disposition: A | Discharge status: Home / Self Care | Payer: Self-pay | Attending: Emergency Medicine | Admitting: Emergency Medicine

## 2017-04-11 ENCOUNTER — Encounter (HOSPITAL_COMMUNITY): Payer: Self-pay | Admitting: Emergency Medicine

## 2017-04-11 DIAGNOSIS — Z79899 Other long term (current) drug therapy: Secondary | ICD-10-CM | POA: Insufficient documentation

## 2017-04-11 DIAGNOSIS — B20 Human immunodeficiency virus [HIV] disease: Secondary | ICD-10-CM | POA: Insufficient documentation

## 2017-04-11 DIAGNOSIS — J189 Pneumonia, unspecified organism: Secondary | ICD-10-CM | POA: Insufficient documentation

## 2017-04-11 DIAGNOSIS — J45909 Unspecified asthma, uncomplicated: Secondary | ICD-10-CM | POA: Insufficient documentation

## 2017-04-11 DIAGNOSIS — F17210 Nicotine dependence, cigarettes, uncomplicated: Secondary | ICD-10-CM | POA: Insufficient documentation

## 2017-04-11 DIAGNOSIS — R05 Cough: Secondary | ICD-10-CM

## 2017-04-11 DIAGNOSIS — R059 Cough, unspecified: Secondary | ICD-10-CM

## 2017-04-11 HISTORY — DX: Unspecified asthma, uncomplicated: J45.909

## 2017-04-11 HISTORY — DX: Bronchitis, not specified as acute or chronic: J40

## 2017-04-11 MED ORDER — AZITHROMYCIN 250 MG PO TABS
500.0000 mg | ORAL_TABLET | Freq: Once | ORAL | Status: AC
Start: 2017-04-11 — End: 2017-04-11
  Administered 2017-04-11: 500 mg via ORAL
  Filled 2017-04-11: qty 2

## 2017-04-11 MED ORDER — AZITHROMYCIN 250 MG PO TABS: 250.0000 mg | ORAL_TABLET | Freq: Every day | ORAL | 0 refills | Status: DC

## 2017-04-11 NOTE — ED Notes (Signed)
ED Provider at bedside. 

## 2017-04-11 NOTE — Discharge Instructions (Signed)
Please see the information and instructions below regarding your visit.  Your diagnoses today include:  1. Cough    Your chest x-ray was suggested that there may be a pneumonia on the left side of your lung.  This needs to be reevaluated.  Tests performed today include: See side panel of your discharge paperwork for testing performed today. Vital signs are listed at the bottom of these instructions.   Chest xray  Medications prescribed:    Take any prescribed medications only as prescribed, and any over the counter medications only as directed on the packaging.  Please take the remainder of your antibiotics, azithromycin. Please take all of your antibiotics until finished.   You may develop abdominal discomfort or nausea from the antibiotic. If this occurs, you may take it with food. Some patients also get diarrhea with antibiotics. You may help offset this with probiotics which you can buy or get in yogurt. Do not eat or take the probiotics until 2 hours after your antibiotic. Some women develop vaginal yeast infections after antibiotics. If you develop unusual vaginal discharge after being on this medication, please see your primary care provider.   Some people develop allergies to antibiotics. Symptoms of antibiotic allergy can be mild and include a flat rash and itching. They can also be more serious and include:  ?Hives - Hives are raised, red patches of skin that are usually very itchy.  ?Lip or tongue swelling  ?Trouble swallowing or breathing  ?Blistering of the skin or mouth.  If you have any of these serious symptoms, please seek emergency medical care immediately.     Home care instructions:  Please follow any educational materials contained in this packet.   Follow-up instructions: Please follow-up with your primary care provider in one week for further evaluation of your symptoms if they are not completely improved.   Please follow up with a repeat chest x-ray in  3-4 weeks.  This may be done at [facility] urgent care or this emergency department if you are still in [place].  Return instructions:  Please return to the Emergency Department if you experience worsening symptoms.  Please return for any worsening shortness of breath, chest pain, fevers, difficulty breathing, nausea, vomiting, or other systemic symptoms. Please return if you have any other emergent concerns.  Additional Information:   Your vital signs today were: BP 107/70 (BP Location: Left Arm)    Pulse 81    Temp 98.1 F (36.7 C) (Oral)    Resp 16    Ht 5\' 7"  (1.702 m)    Wt 100.7 kg (222 lb)    LMP 03/29/2017    SpO2 98%    BMI 34.77 kg/m  If your blood pressure (BP) was elevated on multiple readings during this visit above 130 for the top number or above 80 for the bottom number, please have this repeated by your primary care provider within one month. --------------  Thank you for allowing [place] to participate in your care today.

## 2017-04-11 NOTE — ED Provider Notes (Signed)
[facility] EMERGENCY DEPARTMENT Provider Note   CSN: [id number] Arrival date & time: 04/11/17  0630     History   Chief Complaint Chief Complaint  Patient presents with  . Sinus Congestion  . Cough    HPI Maria Nicholson is a 31 y.o. female.  HPI  Patient is a 31 year old female with a history of asthma, and HIV (sees ID in [place] through [facility]; last CD4 500 and viral load undetectable) presenting for 1 week of congestion and a new cough.  Patient reports that approximately 7-8 days ago, she was congested and felt feverish.  Patient had a brief productive cough at this time productive of copious sputum and scant hemoptysis.  No further hemoptysis. Patient has not had hemoptysis since.  Patient reports she recorded a fever of approximately 101 once last week.  Today's visit to the emergency department was prompted by an episode of chills and sweats this morning at work.  Patient has had a mild headache, and some mild tooth pain.  No vision changes.  No shortness of breath, chest pain, abdominal pain, nausea, vomiting, dysuria, or rash.  Patient reports she has been trying Robitussin for symptoms.  Past Medical History:  Diagnosis Date  . Asthma   . Bronchitis   . HIV (human immunodeficiency virus infection) ([place])   . No pertinent past medical history     Patient Active Problem List   Diagnosis Date Noted  . Eczema 11/17/2011    History reviewed. No pertinent surgical history.  OB History    Gravida Para Term Preterm AB Living   2 0     2 0   SAB TAB Ectopic Multiple Live Births   1 1             Home Medications    Prior to Admission medications   Medication Sig Start Date End Date Taking? Authorizing Provider  abacavir-lamiVUDine (EPZICOM) 600-300 MG tablet Take 1 tablet by mouth daily.    [provider]  azithromycin (ZITHROMAX) 250 MG tablet Take 1 tablet (250 mg total) by mouth daily. Take once daily for 4  days. 04/11/17   Langston Masker B, PA-C  benzonatate (TESSALON) 100 MG capsule Take 1 capsule (100 mg total) by mouth every 8 (eight) hours. Patient not taking: Reported on 03/26/2017 09/11/16   Shary Decamp, PA-C  darunavir-cobicistat (PREZCOBIX) 800-150 MG tablet Take 1 tablet by mouth daily with breakfast. Swallow whole. Do NOT crush, break or chew tablets. Take with food.    [provider]  naproxen (NAPROSYN) 375 MG tablet Take 1 tablet (375 mg total) by mouth 2 (two) times daily. Patient not taking: Reported on 03/26/2017 01/20/17   Dorie Rank, MD  promethazine (PHENERGAN) 25 MG tablet Take 1 tablet (25 mg total) by mouth every 6 (six) hours as needed for nausea or vomiting. 03/27/17   Orpah Greek, MD  ranitidine (ZANTAC) 150 MG tablet Take 1 tablet (150 mg total) by mouth 2 (two) times daily. 03/27/17   Orpah Greek, MD    Family History Family History  Problem Relation Age of Onset  . Cancer Maternal Grandmother        throat  . Cancer Father 18       spinal   . Cancer Maternal Aunt 37       throat    Social History Social History  Substance Use Topics  . Smoking status: Current Every Day Smoker  Packs/day: 0.50    Years: 2.00    Types: Cigarettes  . Smokeless tobacco: Never Used     Comment: decreased from one pack daily     . Alcohol use No     Allergies   Patient has no known allergies.   Review of Systems Review of Systems  Constitutional: Positive for chills and fever.  HENT: Positive for congestion and rhinorrhea. Negative for sinus pain and sore throat.   Respiratory: Positive for cough. Negative for shortness of breath.   Cardiovascular: Negative for chest pain.  Gastrointestinal: Negative for abdominal pain, nausea and vomiting.  Genitourinary: Negative for dysuria.  Musculoskeletal: Negative for myalgias.  Skin: Negative for rash.  Neurological: Positive for headaches.     Physical Exam Updated Vital Signs BP 107/70 (BP  Location: Left Arm)   Pulse 81   Temp 98.1 F (36.7 C) (Oral)   Resp 16   Ht 5\' 7"  (1.702 m)   Wt 100.7 kg (222 lb)   LMP 03/29/2017   SpO2 98%   BMI 34.77 kg/m   Physical Exam  Constitutional: She appears well-developed and well-nourished. No distress.  HENT:  Head: Normocephalic and atraumatic.  Mouth/Throat: Oropharynx is clear and moist.  Uvula midline.  Eyes: Pupils are equal, round, and reactive to light. Conjunctivae and EOM are normal. Right eye exhibits no discharge. Left eye exhibits no discharge.  No erythema of conjunctiva.  Neck: Normal range of motion. Neck supple.  Cardiovascular: Normal rate, regular rhythm, S1 normal and S2 normal.   No murmur heard. Pulmonary/Chest: Effort normal. She has no wheezes. She has no rales.  Patient converses comfortably without audible wheeze or stridor.  Diminished lung sounds in left lung base.   Abdominal: She exhibits no distension.  Musculoskeletal: Normal range of motion. She exhibits no edema or deformity.  Lymphadenopathy:    She has no cervical adenopathy.  Neurological: She is alert.  Cranial nerves grossly intact. Patient was extremities symmetrically and with good coordination.  Skin: Skin is warm and dry. No rash noted. No erythema.  Psychiatric: She has a normal mood and affect. Her behavior is normal. Judgment and thought content normal.  Nursing note and vitals reviewed.    ED Treatments / Results  Labs (all labs ordered are listed, but only abnormal results are displayed) Labs Reviewed - No data to display  EKG  EKG Interpretation None       Radiology Dg Chest 2 View  Result Date: 04/11/2017 CLINICAL DATA:  Productive cough . EXAM: CHEST  2 VIEW COMPARISON:  09/11/2016 . FINDINGS: Mediastinum and hilar structures normal. Mild left perihilar infiltrate cannot be excluded . No pleural effusion or pneumothorax. No acute bony abnormality . IMPRESSION: Mild left perihilar infiltrate cannot be excluded.  Electronically Signed   By: Marcello Moores  Register   On: 04/11/2017 09:49    Procedures Procedures (including critical care time)  Medications Ordered in ED Medications  azithromycin (ZITHROMAX) tablet 500 mg (500 mg Oral Given 04/11/17 1119)     Initial Impression / Assessment and Plan / ED Course  I have reviewed the triage vital signs and the nursing notes.  Pertinent labs & imaging results that were available during my care of the patient were reviewed by me and considered in my medical decision making (see chart for details).     Final Clinical Impressions(s) / ED Diagnoses   Final diagnoses:  Cough   Patient is well-appearing, afebrile, and in no acute distress.  Patient demonstrates no  hypoxia.  Patient is regularly followed and has been compliant with antiviral treatment for HIV.  Patient's ID physicians are in [place], [place] with [facility].  Per patient, her last CD4 count was 500 and viral load was undetectable.  At this time, I do not suspect that patient is at risk for invasive disease given her symptoms and reported CD4 count.  Chest x-ray demonstrated possible perihilar infiltrate on the left.  Will treat this empirically with azithromycin for community-acquired pneumonia.  Discussed that  patient requires close follow-up within one week and repeat chest x-ray in 3-4 weeks.  Patient given strict return precautions for any worsening shortness of breath, chest pain, fevers with her symptoms, or hemoptysis.  Patient is in understanding and agrees with the plan of care.  This is a supervised visit with Dr. Sherwood Gambler. Evaluation, management, and discharge planning discussed with this attending physician.  New Prescriptions Discharge Medication List as of 04/11/2017 11:16 AM    START taking these medications   Details  azithromycin (ZITHROMAX) 250 MG tablet Take 1 tablet (250 mg total) by mouth daily. Take once daily for 4 days., Starting Tue 04/11/2017,  Print         Hopkins, Yetta Flock B, PA-C 04/11/17 [place], MD 04/12/17 [id number]

## 2017-04-11 NOTE — ED Triage Notes (Signed)
Patient reports sinus congestion , rhinorrhea and occasional productive cough onset this week unrelieved by OTC antitussive , denies fever or chills .

## 2017-04-11 NOTE — ED Notes (Signed)
Patient transported to X-ray 

## 2017-06-29 ENCOUNTER — Emergency Department (HOSPITAL_COMMUNITY)
Admission: EM | Admit: 2017-06-29 | Discharge: 2017-06-29 | Disposition: A | Discharge status: Home / Self Care | Payer: Medicaid - Out of State | Attending: Emergency Medicine | Admitting: Emergency Medicine

## 2017-06-29 ENCOUNTER — Encounter (HOSPITAL_COMMUNITY): Payer: Self-pay

## 2017-06-29 DIAGNOSIS — F17210 Nicotine dependence, cigarettes, uncomplicated: Secondary | ICD-10-CM | POA: Insufficient documentation

## 2017-06-29 DIAGNOSIS — J45909 Unspecified asthma, uncomplicated: Secondary | ICD-10-CM | POA: Insufficient documentation

## 2017-06-29 DIAGNOSIS — K529 Noninfective gastroenteritis and colitis, unspecified: Secondary | ICD-10-CM | POA: Diagnosis not present

## 2017-06-29 DIAGNOSIS — Z79899 Other long term (current) drug therapy: Secondary | ICD-10-CM | POA: Insufficient documentation

## 2017-06-29 DIAGNOSIS — R197 Diarrhea, unspecified: Secondary | ICD-10-CM | POA: Diagnosis present

## 2017-06-29 LAB — URINALYSIS, ROUTINE W REFLEX MICROSCOPIC
Bilirubin Urine: NEGATIVE
Glucose, UA: NEGATIVE mg/dL
Hgb urine dipstick: NEGATIVE
Ketones, ur: 5 mg/dL — AB
Leukocytes, UA: NEGATIVE
Nitrite: NEGATIVE
Protein, ur: 30 mg/dL — AB
Specific Gravity, Urine: 1.033 — ABNORMAL HIGH (ref 1.005–1.030)
pH: 5 (ref 5.0–8.0)

## 2017-06-29 LAB — COMPREHENSIVE METABOLIC PANEL
ALT: 14 U/L (ref 14–54)
AST: 21 U/L (ref 15–41)
Albumin: 3.9 g/dL (ref 3.5–5.0)
Alkaline Phosphatase: 65 U/L (ref 38–126)
Anion gap: 8 (ref 5–15)
BUN: 15 mg/dL (ref 6–20)
CO2: 19 mmol/L — ABNORMAL LOW (ref 22–32)
Calcium: 9 mg/dL (ref 8.9–10.3)
Chloride: 106 mmol/L (ref 101–111)
Creatinine, Ser: 0.86 mg/dL (ref 0.44–1.00)
GFR calc Af Amer: >60 mL/min (ref >60)
GFR calc non Af Amer: >60 mL/min (ref >60)
Glucose, Bld: 95 mg/dL (ref 65–99)
Potassium: 3.8 mmol/L (ref 3.5–5.1)
Sodium: 133 mmol/L — ABNORMAL LOW (ref 135–145)
Total Bilirubin: 0.5 mg/dL (ref 0.3–1.2)
Total Protein: 7.2 g/dL (ref 6.5–8.1)

## 2017-06-29 LAB — CBC
HCT: 37.8 % (ref 36.0–46.0)
Hemoglobin: 12.4 g/dL (ref 12.0–15.0)
MCH: 29.7 pg (ref 26.0–34.0)
MCHC: 32.8 g/dL (ref 30.0–36.0)
MCV: 90.4 fL (ref 78.0–100.0)
Platelets: 314 10*3/uL (ref 150–400)
RBC: 4.18 MIL/uL (ref 3.87–5.11)
RDW: 13.4 % (ref 11.5–15.5)
WBC: 6 10*3/uL (ref 4.0–10.5)

## 2017-06-29 LAB — I-STAT BETA HCG BLOOD, ED (MC, WL, AP ONLY): I-stat hCG, quantitative: <5 m[IU]/mL (ref <5)

## 2017-06-29 LAB — LIPASE, BLOOD: Lipase: 29 U/L (ref 11–51)

## 2017-06-29 MED ORDER — LOPERAMIDE HCL 2 MG PO CAPS: 2.0000 mg | ORAL_CAPSULE | Freq: Four times a day (QID) | ORAL | 0 refills | Status: DC | PRN

## 2017-06-29 MED ORDER — ONDANSETRON HCL 4 MG PO TABS: 4.0000 mg | ORAL_TABLET | Freq: Four times a day (QID) | ORAL | 0 refills | Status: DC

## 2017-06-29 NOTE — ED Triage Notes (Signed)
Per Pt, Pt is coming from home with complaints of diarrhea, headache, and fever since yesterday. Pt reports 6 episodes of diarrhea. Nephew was seen yesterday for the same. Denies vomiting.

## 2017-06-29 NOTE — ED Notes (Signed)
PT states understanding of care given, follow up care, and medication prescribed. PT ambulated from ED to car with a steady gait. 

## 2017-06-29 NOTE — ED Provider Notes (Signed)
[facility] EMERGENCY DEPARTMENT Provider Note   CSN: [id number] Arrival date & time: 06/29/17  1242     History   Chief Complaint Chief Complaint  Patient presents with  . Diarrhea    HPI Maria Nicholson is a 32 y.o. female.  HPI   32 year old female presents today with complaints of nausea diarrhea starting this morning.  Patient notes several episodes of nonbloody brown diarrhea.  She reports minor abdominal cramping none localized.  She denies any fever or vomiting at home.  She notes her nephew has similar symptoms.  Patient denies any abnormal food or drink exposure, recent travel.  Patient does note a history of HIV, reports she is taking her medication and has had normal workup with high CD4 count and undetectable viral load.  Patient denies any other complaints here today.  Patient also reports a generalized headache described as pressure, denies any neurological deficits.  Patient reports taking ibuprofen with minor improvement in her symptoms.     Past Medical History:  Diagnosis Date  . Asthma   . Bronchitis   . HIV (human immunodeficiency virus infection) ([place])   . No pertinent past medical history     Patient Active Problem List   Diagnosis Date Noted  . Eczema 11/17/2011    History reviewed. No pertinent surgical history.  OB History    Gravida Para Term Preterm AB Living   2 0     2 0   SAB TAB Ectopic Multiple Live Births   1 1             Home Medications    Prior to Admission medications   Medication Sig Start Date End Date Taking? Authorizing Provider  abacavir-lamiVUDine (EPZICOM) 600-300 MG tablet Take 1 tablet by mouth daily.    [provider]  azithromycin (ZITHROMAX) 250 MG tablet Take 1 tablet (250 mg total) by mouth daily. Take once daily for 4 days. 04/11/17   Langston Masker B, PA-C  benzonatate (TESSALON) 100 MG capsule Take 1 capsule (100 mg total) by mouth every 8 (eight) hours. Patient not taking:  Reported on 03/26/2017 09/11/16   Shary Decamp, PA-C  darunavir-cobicistat (PREZCOBIX) 800-150 MG tablet Take 1 tablet by mouth daily with breakfast. Swallow whole. Do NOT crush, break or chew tablets. Take with food.    [provider]  loperamide (IMODIUM) 2 MG capsule Take 1 capsule (2 mg total) by mouth 4 (four) times daily as needed for diarrhea or loose stools. 06/29/17   Kunaal Walkins, Dellis Filbert, PA-C  naproxen (NAPROSYN) 375 MG tablet Take 1 tablet (375 mg total) by mouth 2 (two) times daily. Patient not taking: Reported on 03/26/2017 01/20/17   Dorie Rank, MD  ondansetron (ZOFRAN) 4 MG tablet Take 1 tablet (4 mg total) by mouth every 6 (six) hours. 06/29/17   Tamana Hatfield, Dellis Filbert, PA-C  promethazine (PHENERGAN) 25 MG tablet Take 1 tablet (25 mg total) by mouth every 6 (six) hours as needed for nausea or vomiting. 03/27/17   Orpah Greek, MD  ranitidine (ZANTAC) 150 MG tablet Take 1 tablet (150 mg total) by mouth 2 (two) times daily. 03/27/17   Orpah Greek, MD    Family History Family History  Problem Relation Age of Onset  . Cancer Maternal Grandmother        throat  . Cancer Father 56       spinal   . Cancer Maternal Aunt 62       throat  Social History Social History   Tobacco Use  . Smoking status: Current Every Day Smoker    Packs/day: 0.50    Years: 2.00    Pack years: 1.00    Types: Cigarettes  . Smokeless tobacco: Never Used  . Tobacco comment: decreased from one pack daily     Substance Use Topics  . Alcohol use: Yes    Comment: Occasionally   . Drug use: Yes    Types: Marijuana     Allergies   Patient has no known allergies.   Review of Systems Review of Systems  All other systems reviewed and are negative.    Physical Exam Updated Vital Signs BP 129/79 (BP Location: Right Arm)   Pulse 91   Temp 98.4 F (36.9 C) (Oral)   Resp 16   Ht 5\' 7"  (1.702 m)   Wt 99.8 kg (220 lb)   LMP 05/15/2017   SpO2 97%   BMI 34.46 kg/m   Physical  Exam  Constitutional: She is oriented to person, place, and time. She appears well-developed and well-nourished.  HENT:  Head: Normocephalic and atraumatic.  Eyes: Conjunctivae are normal. Pupils are equal, round, and reactive to light. Right eye exhibits no discharge. Left eye exhibits no discharge. No scleral icterus.  Neck: Normal range of motion. No JVD present. No tracheal deviation present.  Pulmonary/Chest: Effort normal. No stridor.  Abdominal: Soft. She exhibits no distension and no mass. There is tenderness. There is no rebound and no guarding. No hernia.  Generalized lower abd ttp non focal    Neurological: She is alert and oriented to person, place, and time. No cranial nerve deficit or sensory deficit. She exhibits normal muscle tone. Coordination normal.  Psychiatric: She has a normal mood and affect. Her behavior is normal. Judgment and thought content normal.  Nursing note and vitals reviewed.    ED Treatments / Results  Labs (all labs ordered are listed, but only abnormal results are displayed) Labs Reviewed  COMPREHENSIVE METABOLIC PANEL - Abnormal; Notable for the following components:      Result Value   Sodium 133 (*)    CO2 19 (*)    All other components within normal limits  URINALYSIS, ROUTINE W REFLEX MICROSCOPIC - Abnormal; Notable for the following components:   APPearance CLOUDY (*)    Specific Gravity, Urine 1.033 (*)    Ketones, ur 5 (*)    Protein, ur 30 (*)    Bacteria, UA FEW (*)    Squamous Epithelial / LPF 6-30 (*)    All other components within normal limits  LIPASE, BLOOD  CBC  I-STAT BETA HCG BLOOD, ED ([facility], [facility], [facility] ONLY)    EKG  EKG Interpretation None       Radiology No results found.  Procedures Procedures (including critical care time)  Medications Ordered in ED Medications - No data to display   Initial Impression / Assessment and Plan / ED Course  I have reviewed the triage vital signs and the nursing notes.  Pertinent  labs & imaging results that were available during my care of the patient were reviewed by me and considered in my medical decision making (see chart for details).    Final Clinical Impressions(s) / ED Diagnoses   Final diagnoses:  Gastroenteritis    Labs: Urinalysis, CBC, CMP, lipase  Imaging:  Consults:  Therapeutics:  Discharge Meds:   Assessment/Plan: 32 year old female presents today with likely viral gastroenteritis.  She has reassuring workup here, tolerating p.o., discharged  home with symptomatic care instructions and strict return precautions.  Patient verbalized understanding and agreement to today's plan had no further questions or concerns.  Headache here without red flags.      ED Discharge Orders        Ordered    ondansetron (ZOFRAN) 4 MG tablet  Every 6 hours     06/29/17 1846    loperamide (IMODIUM) 2 MG capsule  4 times daily PRN     06/29/17 1846       Okey Regal, PA-C 06/29/17 Carlyle Lipa, MD 06/29/17 2317

## 2017-06-29 NOTE — Discharge Instructions (Signed)
Please read attached information. If you experience any new or worsening signs or symptoms please return to the emergency room for evaluation. Please follow-up with your primary care provider or specialist as discussed. Please use medication prescribed only as directed and discontinue taking if you have any concerning signs or symptoms.   °

## 2017-07-06 ENCOUNTER — Emergency Department (HOSPITAL_COMMUNITY): Payer: Self-pay

## 2017-07-06 ENCOUNTER — Emergency Department (HOSPITAL_COMMUNITY)
Admission: EM | Admit: 2017-07-06 | Discharge: 2017-07-06 | Disposition: A | Discharge status: Home / Self Care | Payer: Self-pay | Attending: Emergency Medicine | Admitting: Emergency Medicine

## 2017-07-06 ENCOUNTER — Encounter (HOSPITAL_COMMUNITY): Payer: Self-pay

## 2017-07-06 ENCOUNTER — Other Ambulatory Visit: Payer: Self-pay

## 2017-07-06 DIAGNOSIS — R1084 Generalized abdominal pain: Secondary | ICD-10-CM | POA: Insufficient documentation

## 2017-07-06 DIAGNOSIS — J45909 Unspecified asthma, uncomplicated: Secondary | ICD-10-CM | POA: Insufficient documentation

## 2017-07-06 DIAGNOSIS — Z79899 Other long term (current) drug therapy: Secondary | ICD-10-CM | POA: Insufficient documentation

## 2017-07-06 DIAGNOSIS — F17210 Nicotine dependence, cigarettes, uncomplicated: Secondary | ICD-10-CM | POA: Insufficient documentation

## 2017-07-06 LAB — URINALYSIS, ROUTINE W REFLEX MICROSCOPIC
Bilirubin Urine: NEGATIVE
Glucose, UA: NEGATIVE mg/dL
Hgb urine dipstick: NEGATIVE
Ketones, ur: NEGATIVE mg/dL
Leukocytes, UA: NEGATIVE
Nitrite: NEGATIVE
Protein, ur: NEGATIVE mg/dL
Specific Gravity, Urine: 1.026 (ref 1.005–1.030)
pH: 6 (ref 5.0–8.0)

## 2017-07-06 LAB — COMPREHENSIVE METABOLIC PANEL
ALT: 20 U/L (ref 14–54)
AST: 21 U/L (ref 15–41)
Albumin: 3.8 g/dL (ref 3.5–5.0)
Alkaline Phosphatase: 64 U/L (ref 38–126)
Anion gap: 8 (ref 5–15)
BUN: 11 mg/dL (ref 6–20)
CO2: 23 mmol/L (ref 22–32)
Calcium: 8.9 mg/dL (ref 8.9–10.3)
Chloride: 105 mmol/L (ref 101–111)
Creatinine, Ser: 1.02 mg/dL — ABNORMAL HIGH (ref 0.44–1.00)
GFR calc Af Amer: >60 mL/min (ref >60)
GFR calc non Af Amer: >60 mL/min (ref >60)
Glucose, Bld: 91 mg/dL (ref 65–99)
Potassium: 4.2 mmol/L (ref 3.5–5.1)
Sodium: 136 mmol/L (ref 135–145)
Total Bilirubin: 0.4 mg/dL (ref 0.3–1.2)
Total Protein: 7.4 g/dL (ref 6.5–8.1)

## 2017-07-06 LAB — CBC
HCT: 37.6 % (ref 36.0–46.0)
Hemoglobin: 12.7 g/dL (ref 12.0–15.0)
MCH: 30.6 pg (ref 26.0–34.0)
MCHC: 33.8 g/dL (ref 30.0–36.0)
MCV: 90.6 fL (ref 78.0–100.0)
Platelets: 336 10*3/uL (ref 150–400)
RBC: 4.15 MIL/uL (ref 3.87–5.11)
RDW: 13.3 % (ref 11.5–15.5)
WBC: 6.1 10*3/uL (ref 4.0–10.5)

## 2017-07-06 LAB — LIPASE, BLOOD: Lipase: 25 U/L (ref 11–51)

## 2017-07-06 LAB — I-STAT BETA HCG BLOOD, ED (MC, WL, AP ONLY): I-stat hCG, quantitative: <5 m[IU]/mL (ref <5)

## 2017-07-06 MED ORDER — ONDANSETRON HCL 4 MG/2ML IJ SOLN: 4.0000 mg | Freq: Once | INTRAMUSCULAR | Status: DC

## 2017-07-06 MED ORDER — ONDANSETRON 4 MG PO TBDP
4.0000 mg | ORAL_TABLET | Freq: Once | ORAL | Status: AC
  Administered 2017-07-06: 4 mg via ORAL

## 2017-07-06 MED ORDER — IOPAMIDOL (ISOVUE-300) INJECTION 61%
INTRAVENOUS | Status: AC
  Administered 2017-07-06: 100 mL
  Filled 2017-07-06: qty 100

## 2017-07-06 MED ORDER — ONDANSETRON 4 MG PO TBDP
8.0000 mg | ORAL_TABLET | Freq: Once | ORAL | Status: DC
  Filled 2017-07-06: qty 2

## 2017-07-06 NOTE — ED Notes (Signed)
Alert, NAD, calm, interactive, resps e/u, speaking in clear complete sentences, no dyspnea noted, skin W&D, VSS, c/o nausea, bilateral trapezius achiness, also intermittent sharp abd pains, relates abd pain to "recent GI illness/stomach flu (seen here last Thursday for the same), and/or her fibroids" (denies: fever, sob, recent VD, urinary sx, dizziness or visual changes). Family at [facility].

## 2017-07-06 NOTE — ED Provider Notes (Signed)
[facility] EMERGENCY DEPARTMENT Provider Note   CSN: [id number] Arrival date & time: 07/06/17  1435     History   Chief Complaint Chief Complaint  Patient presents with  . Emesis    HPI Maria Nicholson is a 32 y.o. female with hx of asthma and HIV  who presents to the ED with n/v and abdominal pain. Patient reports being evaluated last week for abdominal pain and diarrhea, n/v. Patient states she was treated for GI bug. Patient reports that the symptoms have persisted and now she is also having vaginal pain. Patient has been around a child with viral illness. Patient reports taking imodium for the diarrhea and it did stop it but now she has not had a stool in 2 days.   The history is provided by the patient. No language interpreter was used.  Emesis   The current episode started 2 days ago. The problem occurs 2 to 4 times per day. The problem has been gradually improving. There has been no fever. Associated symptoms include abdominal pain, cough and headaches. Pertinent negatives include no chills, no diarrhea, no fever and no sweats. Risk factors include ill contacts.  Abdominal Pain   This is a new problem. The current episode started 2 days ago. The problem occurs constantly. Associated with: worse after eating. The pain is located in the RLQ and LLQ. The quality of the pain is cramping. The pain is at a severity of 10/10. Associated symptoms include vomiting, dysuria and headaches. Pertinent negatives include fever, diarrhea and frequency. Nothing aggravates the symptoms. Nothing relieves the symptoms.    Past Medical History:  Diagnosis Date  . Asthma   . Bronchitis   . HIV (human immunodeficiency virus infection) ([place])   . No pertinent past medical history     Patient Active Problem List   Diagnosis Date Noted  . Eczema 11/17/2011    History reviewed. No pertinent surgical history.  OB History    Gravida Para Term Preterm AB Living   2 0     2 0   SAB TAB Ectopic Multiple Live Births   1 1             Home Medications    Prior to Admission medications   Medication Sig Start Date End Date Taking? Authorizing Provider  abacavir-lamiVUDine (EPZICOM) 600-300 MG tablet Take 1 tablet by mouth daily.    [provider]  azithromycin (ZITHROMAX) 250 MG tablet Take 1 tablet (250 mg total) by mouth daily. Take once daily for 4 days. 04/11/17   Langston Masker B, PA-C  benzonatate (TESSALON) 100 MG capsule Take 1 capsule (100 mg total) by mouth every 8 (eight) hours. Patient not taking: Reported on 03/26/2017 09/11/16   Shary Decamp, PA-C  darunavir-cobicistat (PREZCOBIX) 800-150 MG tablet Take 1 tablet by mouth daily with breakfast. Swallow whole. Do NOT crush, break or chew tablets. Take with food.    [provider]  loperamide (IMODIUM) 2 MG capsule Take 1 capsule (2 mg total) by mouth 4 (four) times daily as needed for diarrhea or loose stools. 06/29/17   Hedges, Dellis Filbert, PA-C  naproxen (NAPROSYN) 375 MG tablet Take 1 tablet (375 mg total) by mouth 2 (two) times daily. Patient not taking: Reported on 03/26/2017 01/20/17   Dorie Rank, MD  ondansetron (ZOFRAN) 4 MG tablet Take 1 tablet (4 mg total) by mouth every 6 (six) hours. 06/29/17   Hedges, Dellis Filbert, PA-C  promethazine (PHENERGAN) 25 MG tablet Take  1 tablet (25 mg total) by mouth every 6 (six) hours as needed for nausea or vomiting. 03/27/17   Orpah Greek, MD  ranitidine (ZANTAC) 150 MG tablet Take 1 tablet (150 mg total) by mouth 2 (two) times daily. 03/27/17   Orpah Greek, MD    Family History Family History  Problem Relation Age of Onset  . Cancer Maternal Grandmother        throat  . Cancer Father 53       spinal   . Cancer Maternal Aunt 62       throat    Social History Social History   Tobacco Use  . Smoking status: Current Every Day Smoker    Packs/day: 0.50    Years: 2.00    Pack years: 1.00    Types: Cigarettes  . Smokeless tobacco:  Never Used  . Tobacco comment: decreased from one pack daily     Substance Use Topics  . Alcohol use: Yes    Comment: Occasionally   . Drug use: Yes    Types: Marijuana     Allergies   Patient has no known allergies.   Review of Systems Review of Systems  Constitutional: Negative for chills and fever.  HENT: Positive for congestion. Negative for ear pain and sore throat.   Eyes: Negative for pain, discharge, redness and itching.  Respiratory: Positive for cough and shortness of breath.   Cardiovascular: Negative for chest pain.  Gastrointestinal: Positive for abdominal pain and vomiting. Negative for diarrhea.  Genitourinary: Positive for dysuria. Negative for frequency.  Musculoskeletal: Positive for back pain. Negative for neck pain and neck stiffness.  Skin: Negative for rash.  Neurological: Positive for headaches. Negative for syncope and weakness.  Psychiatric/Behavioral: Negative for confusion.     Physical Exam Updated Vital Signs BP (!) 98/55   Pulse 73   Temp 98.7 F (37.1 C) (Oral)   Resp 16   LMP 05/22/2017 (Within Days)   SpO2 99%   Physical Exam  Constitutional: She appears well-developed and well-nourished. No distress.  HENT:  Head: Normocephalic and atraumatic.  Nose: Nose normal.  Mouth/Throat: Oropharynx is clear and moist.  Eyes: EOM are normal. Pupils are equal, round, and reactive to light.  Neck: Normal range of motion. Neck supple.  Cardiovascular: Normal rate and regular rhythm.  Pulmonary/Chest: Effort normal and breath sounds normal.  Abdominal: Soft. Bowel sounds are normal. There is generalized tenderness. There is no CVA tenderness.  Musculoskeletal: Normal range of motion.  Neurological: She is alert.  Skin: Skin is warm and dry.  Psychiatric: She has a normal mood and affect. Her behavior is normal.  Nursing note and vitals reviewed.    ED Treatments / Results  Labs (all labs ordered are listed, but only abnormal results are  displayed) Labs Reviewed  COMPREHENSIVE METABOLIC PANEL - Abnormal; Notable for the following components:      Result Value   Creatinine, Ser 1.02 (*)    All other components within normal limits  LIPASE, BLOOD  CBC  URINALYSIS, ROUTINE W REFLEX MICROSCOPIC  I-STAT BETA HCG BLOOD, ED ([facility], [facility], [facility] ONLY)   Radiology No results found.  Procedures Procedures (including critical care time)  Medications Ordered in ED Medications  ondansetron (ZOFRAN) injection 4 mg (not administered)  ondansetron (ZOFRAN-ODT) disintegrating tablet 4 mg (4 mg Oral Given 07/06/17 1946)     Initial Impression / Assessment and Plan / ED Course  I have reviewed the triage vital signs and the  nursing notes. 32 y.o. female with abdominal pain, n/v awaiting CT scan of abdomen and pelvis. Patient has had medication for nausea.  Care turned over to [facility], [facility], [facility], [facility] @ 10:00 pm.   Final Clinical Impressions(s) / ED Diagnoses   ED Discharge Orders    None       Debroah Baller [place], [place] 07/06/17 2205    Milton Ferguson, MD 07/06/17 2342

## 2017-07-06 NOTE — ED Notes (Signed)
PT states understanding of care given, follow up care. PT ambulated from ED to car with a steady gait.  

## 2017-07-06 NOTE — ED Notes (Signed)
To CT, alert, NAD, calm, interactive, no dyspnea, denies nausea.

## 2017-07-06 NOTE — ED Triage Notes (Signed)
Pt states she was seen here last week for GI bug. She states she has had continuous abdominal pain with diarrhea, nausea at night and vomiting. She also reports some vaginal pain. LMP was 05/22/17.

## 2017-07-06 NOTE — Discharge Instructions (Signed)
Fortunately your CT scan was normal.  Please follow up with the infectious disease clinic.   Return to ER for new or worsening symptoms, any additional concerns.

## 2017-07-06 NOTE — ED Notes (Signed)
Pt called X 3 to be roomed. No response. Pt will be called later.

## 2017-07-06 NOTE — ED Notes (Signed)
Back from CT, no changes, alert, NAD, calm, no dyspnea, given ice chips.

## 2017-07-06 NOTE — ED Provider Notes (Signed)
Care assumed from previous provider NP [facility]. Please see note for further details. Briefly, patient is a 32 y.o. female with hx of HIV here with n/v and abdominal pain. Case discussed, plan agreed upon. Will follow up on pending CT abd/pelvis. If negative, likely discharge with ID referral information for follow up.   CT negative. Understands to follow up with ID clinic and referral given.  Reasons to return to ER discussed and all questions answered.    Delaila Nand, Ozella Almond, PA-C 07/07/17 4599    Noemi Chapel, MD 07/07/17 [phone]

## 2017-07-06 NOTE — ED Notes (Signed)
No response when called to be roomed at 18:48 x2.

## 2017-08-23 ENCOUNTER — Other Ambulatory Visit: Payer: Self-pay

## 2017-08-23 ENCOUNTER — Encounter (HOSPITAL_COMMUNITY): Payer: Self-pay | Admitting: *Deleted

## 2017-08-23 ENCOUNTER — Emergency Department (HOSPITAL_COMMUNITY)
Admission: EM | Admit: 2017-08-23 | Discharge: 2017-08-24 | Disposition: A | Discharge status: Home / Self Care | Payer: Medicaid - Out of State | Attending: Emergency Medicine | Admitting: Emergency Medicine

## 2017-08-23 ENCOUNTER — Emergency Department (HOSPITAL_COMMUNITY): Payer: Medicaid - Out of State

## 2017-08-23 DIAGNOSIS — B20 Human immunodeficiency virus [HIV] disease: Secondary | ICD-10-CM | POA: Insufficient documentation

## 2017-08-23 DIAGNOSIS — F17210 Nicotine dependence, cigarettes, uncomplicated: Secondary | ICD-10-CM | POA: Insufficient documentation

## 2017-08-23 DIAGNOSIS — R102 Pelvic and perineal pain: Secondary | ICD-10-CM | POA: Insufficient documentation

## 2017-08-23 DIAGNOSIS — D259 Leiomyoma of uterus, unspecified: Secondary | ICD-10-CM | POA: Insufficient documentation

## 2017-08-23 DIAGNOSIS — D219 Benign neoplasm of connective and other soft tissue, unspecified: Secondary | ICD-10-CM

## 2017-08-23 DIAGNOSIS — Z79899 Other long term (current) drug therapy: Secondary | ICD-10-CM | POA: Insufficient documentation

## 2017-08-23 DIAGNOSIS — R1084 Generalized abdominal pain: Secondary | ICD-10-CM | POA: Insufficient documentation

## 2017-08-23 LAB — COMPREHENSIVE METABOLIC PANEL
ALT: 12 U/L — ABNORMAL LOW (ref 14–54)
AST: 14 U/L — ABNORMAL LOW (ref 15–41)
Albumin: 3.8 g/dL (ref 3.5–5.0)
Alkaline Phosphatase: 64 U/L (ref 38–126)
Anion gap: 8 (ref 5–15)
BUN: 10 mg/dL (ref 6–20)
CO2: 25 mmol/L (ref 22–32)
Calcium: 9.5 mg/dL (ref 8.9–10.3)
Chloride: 104 mmol/L (ref 101–111)
Creatinine, Ser: 0.78 mg/dL (ref 0.44–1.00)
GFR calc Af Amer: >60 mL/min (ref >60)
GFR calc non Af Amer: >60 mL/min (ref >60)
Glucose, Bld: 90 mg/dL (ref 65–99)
Potassium: 3.9 mmol/L (ref 3.5–5.1)
Sodium: 137 mmol/L (ref 135–145)
Total Bilirubin: 0.6 mg/dL (ref 0.3–1.2)
Total Protein: 7.9 g/dL (ref 6.5–8.1)

## 2017-08-23 LAB — CBC
HCT: 37.6 % (ref 36.0–46.0)
Hemoglobin: 12.6 g/dL (ref 12.0–15.0)
MCH: 30.4 pg (ref 26.0–34.0)
MCHC: 33.5 g/dL (ref 30.0–36.0)
MCV: 90.8 fL (ref 78.0–100.0)
Platelets: 355 10*3/uL (ref 150–400)
RBC: 4.14 MIL/uL (ref 3.87–5.11)
RDW: 13.6 % (ref 11.5–15.5)
WBC: 10.5 10*3/uL (ref 4.0–10.5)

## 2017-08-23 LAB — I-STAT TROPONIN, ED: Troponin i, poc: 0 ng/mL (ref 0.00–0.08)

## 2017-08-23 LAB — LIPASE, BLOOD: Lipase: 21 U/L (ref 11–51)

## 2017-08-23 MED ORDER — SODIUM CHLORIDE 0.9 % IJ SOLN
INTRAMUSCULAR | Status: AC
  Filled 2017-08-23: qty 50

## 2017-08-23 MED ORDER — KETOROLAC TROMETHAMINE 30 MG/ML IJ SOLN
30.0000 mg | Freq: Once | INTRAMUSCULAR | Status: AC
  Administered 2017-08-23: 30 mg via INTRAVENOUS
  Filled 2017-08-23: qty 1

## 2017-08-23 MED ORDER — PROMETHAZINE HCL 25 MG/ML IJ SOLN
25.0000 mg | Freq: Once | INTRAMUSCULAR | Status: AC
  Administered 2017-08-23: 25 mg via INTRAMUSCULAR
  Filled 2017-08-23 (×2): qty 1

## 2017-08-23 MED ORDER — IOPAMIDOL (ISOVUE-300) INJECTION 61%
INTRAVENOUS | Status: AC
  Administered 2017-08-24: 100 mL
  Filled 2017-08-23: qty 100

## 2017-08-23 MED ORDER — SODIUM CHLORIDE 0.9 % IV BOLUS (SEPSIS)
1000.0000 mL | Freq: Once | INTRAVENOUS | Status: AC
  Administered 2017-08-23: 1000 mL via INTRAVENOUS

## 2017-08-23 NOTE — ED Triage Notes (Addendum)
EMS reports rt sided abd pain, tightness such as gas building, 2 BMs without relief. 118/77-82-16-100% RA  Has not taken anything for discomfort

## 2017-08-24 ENCOUNTER — Emergency Department (HOSPITAL_COMMUNITY): Payer: Medicaid - Out of State

## 2017-08-24 LAB — URINALYSIS, ROUTINE W REFLEX MICROSCOPIC
Bilirubin Urine: NEGATIVE
Glucose, UA: NEGATIVE mg/dL
Hgb urine dipstick: NEGATIVE
Ketones, ur: 20 mg/dL — AB
Leukocytes, UA: NEGATIVE
Nitrite: NEGATIVE
Protein, ur: NEGATIVE mg/dL
Specific Gravity, Urine: 1.025 (ref 1.005–1.030)
pH: 5 (ref 5.0–8.0)

## 2017-08-24 LAB — WET PREP, GENITAL
Clue Cells Wet Prep HPF POC: NONE SEEN
Sperm: NONE SEEN
Trich, Wet Prep: NONE SEEN
Yeast Wet Prep HPF POC: NONE SEEN

## 2017-08-24 LAB — I-STAT BETA HCG BLOOD, ED (MC, WL, AP ONLY): I-stat hCG, quantitative: <5 m[IU]/mL (ref <5)

## 2017-08-24 LAB — GC/CHLAMYDIA PROBE AMP (~~LOC~~) NOT AT ARMC
Chlamydia: NEGATIVE
Neisseria Gonorrhea: NEGATIVE

## 2017-08-24 MED ORDER — ONDANSETRON HCL 4 MG PO TABS: 4.0000 mg | ORAL_TABLET | Freq: Three times a day (TID) | ORAL | 0 refills | Status: DC | PRN

## 2017-08-24 MED ORDER — NAPROXEN 500 MG PO TABS: ORAL_TABLET | ORAL | 0 refills | Status: DC

## 2017-08-24 NOTE — Discharge Instructions (Signed)
Use the zofran for nausea and vomiting. Take the naproxen for pain. Call the [facility] to be evaluated for your fibroids.

## 2017-08-24 NOTE — ED Notes (Signed)
Patient states she is unable to void at this time-states she voided multiple times in the waiting room

## 2017-08-24 NOTE — ED Notes (Signed)
Pelvic supplies at bedside. 

## 2017-08-24 NOTE — ED Provider Notes (Signed)
[facility] DEPT Provider Note   CSN: [id number] Arrival date & time: 08/23/17  1341  Time seen 23:05 PM    History   Chief Complaint Chief Complaint  Patient presents with  . Abdominal Pain    HPI Maria Nicholson is a 32 y.o. female.  HPI patient states yesterday while driving in the car she started having abdominal cramping that started in her upper abdomen that ended up in her lower abdomen and basically was diffuse.  She states she has never had this pain before she states the pain is been there constantly since it started.  She states she feels like her abdomen is bloated.  She had nausea yesterday and vomited once yesterday.  She denies diarrhea states she had a normal bowel movement yesterday and today.  She tried drinking milk today because she is lactose intolerant and had one diarrhea stool however that did not improve her symptoms.  She states she has known fibroids and is unsure if this pain is from that.  She states her last normal period started in middle of February.  She has had frequency without dysuria.  She states she had some mild white vaginal discharge.  She denies fever.  He states she is sexually active and they do not always use condoms.  She denies any new sexual partners.  Patient states she had a CT scan in November for abdominal pain and was referred to gastroenterologist which she is not seen.  She states she moved here in January.  She also had a abdominal CT done in January that was normal, except for fibroids.  PCP none  Past Medical History:  Diagnosis Date  . Asthma   . Bronchitis   . HIV (human immunodeficiency virus infection) ([place])   . No pertinent past medical history     Patient Active Problem List   Diagnosis Date Noted  . Eczema 11/17/2011    History reviewed. No pertinent surgical history.  OB History    Gravida Para Term Preterm AB Living   2 0     2 0   SAB TAB Ectopic Multiple Live Births   1 1               Home Medications    Prior to Admission medications   Medication Sig Start Date End Date Taking? Authorizing Provider  abacavir-lamiVUDine (EPZICOM) 600-300 MG tablet Take 1 tablet by mouth daily.   Yes [provider]  darunavir-cobicistat (PREZCOBIX) 800-150 MG tablet Take 1 tablet by mouth daily with breakfast. Swallow whole. Do NOT crush, break or chew tablets. Take with food.   Yes [provider]  azithromycin (ZITHROMAX) 250 MG tablet Take 1 tablet (250 mg total) by mouth daily. Take once daily for 4 days. Patient not taking: Reported on 08/24/2017 04/11/17   Langston Masker B, PA-C  benzonatate (TESSALON) 100 MG capsule Take 1 capsule (100 mg total) by mouth every 8 (eight) hours. Patient not taking: Reported on 03/26/2017 09/11/16   Shary Decamp, PA-C  loperamide (IMODIUM) 2 MG capsule Take 1 capsule (2 mg total) by mouth 4 (four) times daily as needed for diarrhea or loose stools. Patient not taking: Reported on 08/24/2017 06/29/17   Hedges, Dellis Filbert, PA-C  naproxen (NAPROSYN) 500 MG tablet Take 1 po BID with food prn pain 08/24/17   Rolland Porter, MD  ondansetron (ZOFRAN) 4 MG tablet Take 1 tablet (4 mg total) by mouth every 8 (eight) hours as needed for nausea  or vomiting. 08/24/17   Rolland Porter, MD  promethazine (PHENERGAN) 25 MG tablet Take 1 tablet (25 mg total) by mouth every 6 (six) hours as needed for nausea or vomiting. Patient not taking: Reported on 08/24/2017 03/27/17   Orpah Greek, MD  ranitidine (ZANTAC) 150 MG tablet Take 1 tablet (150 mg total) by mouth 2 (two) times daily. Patient not taking: Reported on 08/24/2017 03/27/17   Orpah Greek, MD    Family History Family History  Problem Relation Age of Onset  . Cancer Maternal Grandmother        throat  . Cancer Father 12       spinal   . Cancer Maternal Aunt 62       throat    Social History Social History   Tobacco Use  . Smoking status: Current Every Day Smoker    Packs/day: 0.50     Years: 2.00    Pack years: 1.00    Types: Cigarettes  . Smokeless tobacco: Never Used  . Tobacco comment: decreased from one pack daily     Substance Use Topics  . Alcohol use: Yes    Comment: Occasionally   . Drug use: Yes    Types: Marijuana  unemployed Smokes 1/5 ppd    Allergies   Patient has no known allergies.   Review of Systems Review of Systems  All other systems reviewed and are negative.    Physical Exam Updated Vital Signs BP 117/68   Pulse 82   Temp 99 F (37.2 C) (Oral)   Resp 16   SpO2 99%   Physical Exam  Constitutional: She is oriented to person, place, and time. She appears well-developed and well-nourished.  Non-toxic appearance. She does not appear ill. No distress.  HENT:  Head: Normocephalic and atraumatic.  Right Ear: External ear normal.  Left Ear: External ear normal.  Nose: Nose normal. No mucosal edema or rhinorrhea.  Mouth/Throat: Oropharynx is clear and moist and mucous membranes are normal. No dental abscesses or uvula swelling.  Eyes: Conjunctivae and EOM are normal. Pupils are equal, round, and reactive to light.  Neck: Normal range of motion and full passive range of motion without pain. Neck supple.  Cardiovascular: Normal rate, regular rhythm and normal heart sounds. Exam reveals no gallop and no friction rub.  No murmur heard. Pulmonary/Chest: Effort normal and breath sounds normal. No respiratory distress. She has no wheezes. She has no rhonchi. She has no rales. She exhibits no tenderness and no crepitus.  Abdominal: Soft. Normal appearance and bowel sounds are normal. She exhibits no distension. There is generalized tenderness. There is no rebound and no guarding.    Patient is nontender to palpation but states the worst discomfort is epigastric and her right lower quadrant  Musculoskeletal: Normal range of motion. She exhibits no edema or tenderness.  Moves all extremities well.   Neurological: She is alert and oriented  to person, place, and time. She has normal strength. No cranial nerve deficit.  Skin: Skin is warm, dry and intact. No rash noted. No erythema. No pallor.  Psychiatric: She has a normal mood and affect. Her speech is normal and behavior is normal. Her mood appears not anxious.  Nursing note and vitals reviewed.    ED Treatments / Results  Labs (all labs ordered are listed, but only abnormal results are displayed) Results for orders placed or performed during the hospital encounter of 08/23/17  Wet prep, genital  Result Value Ref Range  Yeast Wet Prep HPF POC NONE SEEN NONE SEEN   Trich, Wet Prep NONE SEEN NONE SEEN   Clue Cells Wet Prep HPF POC NONE SEEN NONE SEEN   WBC, Wet Prep HPF POC MANY (A) NONE SEEN   Sperm NONE SEEN   Lipase, blood  Result Value Ref Range   Lipase 21 11 - 51 U/L  Comprehensive metabolic panel  Result Value Ref Range   Sodium 137 135 - 145 mmol/L   Potassium 3.9 3.5 - 5.1 mmol/L   Chloride 104 101 - 111 mmol/L   CO2 25 22 - 32 mmol/L   Glucose, Bld 90 65 - 99 mg/dL   BUN 10 6 - 20 mg/dL   Creatinine, Ser 0.78 0.44 - 1.00 mg/dL   Calcium 9.5 8.9 - 10.3 mg/dL   Total Protein 7.9 6.5 - 8.1 g/dL   Albumin 3.8 3.5 - 5.0 g/dL   AST 14 (L) 15 - 41 U/L   ALT 12 (L) 14 - 54 U/L   Alkaline Phosphatase 64 38 - 126 U/L   Total Bilirubin 0.6 0.3 - 1.2 mg/dL   GFR calc non Af Amer >60 >60 mL/min   GFR calc Af Amer >60 >60 mL/min   Anion gap 8 5 - 15  CBC  Result Value Ref Range   WBC 10.5 4.0 - 10.5 K/uL   RBC 4.14 3.87 - 5.11 MIL/uL   Hemoglobin 12.6 12.0 - 15.0 g/dL   HCT 37.6 36.0 - 46.0 %   MCV 90.8 78.0 - 100.0 fL   MCH 30.4 26.0 - 34.0 pg   MCHC 33.5 30.0 - 36.0 g/dL   RDW 13.6 11.5 - 15.5 %   Platelets 355 150 - 400 K/uL  Urinalysis, Routine w reflex microscopic  Result Value Ref Range   Color, Urine YELLOW YELLOW   APPearance CLOUDY (A) CLEAR   Specific Gravity, Urine 1.025 1.005 - 1.030   pH 5.0 5.0 - 8.0   Glucose, UA NEGATIVE NEGATIVE  mg/dL   Hgb urine dipstick NEGATIVE NEGATIVE   Bilirubin Urine NEGATIVE NEGATIVE   Ketones, ur 20 (A) NEGATIVE mg/dL   Protein, ur NEGATIVE NEGATIVE mg/dL   Nitrite NEGATIVE NEGATIVE   Leukocytes, UA NEGATIVE NEGATIVE  I-Stat beta hCG blood, ED  Result Value Ref Range   I-stat hCG, quantitative <5.0 <5 mIU/mL   Comment 3          I-stat troponin, ED  Result Value Ref Range   Troponin i, poc 0.00 0.00 - 0.08 ng/mL   Comment 3           Laboratory interpretation all normal     EKG  EKG Interpretation None       Radiology Ct Abdomen Pelvis W Contrast  Result Date: 08/24/2017 CLINICAL DATA:  Right-sided abdominal pain.  Abdominal distention. EXAM: CT ABDOMEN AND PELVIS WITH CONTRAST TECHNIQUE: Multidetector CT imaging of the abdomen and pelvis was performed using the standard protocol following bolus administration of intravenous contrast. CONTRAST:  184mL ISOVUE-300 IOPAMIDOL (ISOVUE-300) INJECTION 61% COMPARISON:  CT 07/06/2017 FINDINGS: Lower chest: Lung bases are clear. Hepatobiliary: No focal liver abnormality is seen. No gallstones, gallbladder wall thickening, or biliary dilatation. Pancreas: No ductal dilatation or inflammation. Spleen: Normal in size without focal abnormality. Adrenals/Urinary Tract: Normal adrenal glands. No hydronephrosis or perinephric edema. Homogeneous renal enhancement. Urinary bladder is nondistended. Stomach/Bowel: Stomach is within normal limits. Appendix appears normal (coronal image 43). No evidence of bowel wall thickening, distention, or inflammatory changes. Vascular/Lymphatic:  No significant vascular findings are present. No enlarged abdominal or pelvic lymph nodes. Reproductive: Enlarged uterus containing fibroids. No adnexal mass. Trace pelvic free fluid again seen. Other: Small fat containing umbilical hernia. No ascites. No free air. Musculoskeletal: There are no acute or suspicious osseous abnormalities. IMPRESSION: 1. No acute abnormality in the  abdomen/pelvis. 2. Enlarged fibroid uterus. Electronically Signed   By: Jeb Levering M.D.   On: 08/24/2017 01:15    Procedures Procedures (including critical care time)  Medications Ordered in ED Medications  sodium chloride 0.9 % injection (not administered)  sodium chloride 0.9 % bolus 1,000 mL (0 mLs Intravenous Stopped 08/24/17 0242)  sodium chloride 0.9 % bolus 1,000 mL (0 mLs Intravenous Stopped 08/24/17 0059)  promethazine (PHENERGAN) injection 25 mg (25 mg Intramuscular Given 08/23/17 2356)  ketorolac (TORADOL) 30 MG/ML injection 30 mg (30 mg Intravenous Given 08/23/17 2356)  iopamidol (ISOVUE-300) 61 % injection (100 mLs  Contrast Given 08/24/17 0020)     Initial Impression / Assessment and Plan / ED Course  I have reviewed the triage vital signs and the nursing notes.  Pertinent labs & imaging results that were available during my care of the patient were reviewed by me and considered in my medical decision making (see chart for details).    CT of the abdomen was done although I suspect is not go to show a lot with her normal white blood cell count and no fever.  She was given IV fluids, Phenergan IM and Toradol IV.  After reviewing patient's CT scan a pelvic exam was done.  Patient has normal external genitalia, she has some white thick vaginal discharge that could be consistent with yeast infection.  Her uterus was tender, I am unable to assess the size due to her abdominal wall thickness, she has some tenderness over the right adnexa but not the left.  Patient does not want to be treated empirically for an STD.  She states the discomfort is similar to what she has had with her prior exams with her fibroids.   Patient's wet prep is negative.  She will be sent home with nonsteroidal anti-inflammatory pain medication and advised to follow-up with GYN about her fibroids.   Final Clinical Impressions(s) / ED Diagnoses   Final diagnoses:  Generalized abdominal pain  Leiomyoma     ED Discharge Orders        Ordered    naproxen (NAPROSYN) 500 MG tablet     08/24/17 0426    ondansetron (ZOFRAN) 4 MG tablet  Every 8 hours PRN     08/24/17 0426      Plan discharge  Rolland Porter, MD, Barbette Or, MD 08/24/17 0430

## 2017-08-24 NOTE — ED Notes (Signed)
To CT and returned

## 2017-09-11 ENCOUNTER — Encounter (HOSPITAL_COMMUNITY): Payer: Self-pay | Admitting: Emergency Medicine

## 2017-09-11 ENCOUNTER — Emergency Department (HOSPITAL_COMMUNITY): Payer: Self-pay

## 2017-09-11 ENCOUNTER — Emergency Department (HOSPITAL_COMMUNITY)
Admission: EM | Admit: 2017-09-11 | Discharge: 2017-09-11 | Disposition: A | Discharge status: Home / Self Care | Payer: Self-pay | Attending: Emergency Medicine | Admitting: Emergency Medicine

## 2017-09-11 DIAGNOSIS — J45909 Unspecified asthma, uncomplicated: Secondary | ICD-10-CM | POA: Insufficient documentation

## 2017-09-11 DIAGNOSIS — Z21 Asymptomatic human immunodeficiency virus [HIV] infection status: Secondary | ICD-10-CM | POA: Insufficient documentation

## 2017-09-11 DIAGNOSIS — Z79899 Other long term (current) drug therapy: Secondary | ICD-10-CM | POA: Insufficient documentation

## 2017-09-11 DIAGNOSIS — J069 Acute upper respiratory infection, unspecified: Secondary | ICD-10-CM | POA: Insufficient documentation

## 2017-09-11 DIAGNOSIS — B9789 Other viral agents as the cause of diseases classified elsewhere: Secondary | ICD-10-CM

## 2017-09-11 DIAGNOSIS — F17210 Nicotine dependence, cigarettes, uncomplicated: Secondary | ICD-10-CM | POA: Insufficient documentation

## 2017-09-11 MED ORDER — ALBUTEROL SULFATE (2.5 MG/3ML) 0.083% IN NEBU
5.0000 mg | INHALATION_SOLUTION | Freq: Once | RESPIRATORY_TRACT | Status: AC
  Administered 2017-09-11: 5 mg via RESPIRATORY_TRACT
  Filled 2017-09-11: qty 6

## 2017-09-11 MED ORDER — SALINE SPRAY 0.65 % NA SOLN: 1.0000 | NASAL | 0 refills | Status: DC | PRN

## 2017-09-11 NOTE — ED Notes (Signed)
Sign pad not working verbalized understanding of discharge instructions.  

## 2017-09-11 NOTE — ED Provider Notes (Signed)
[facility] EMERGENCY DEPARTMENT Provider Note   CSN: [id number] Arrival date & time: 09/11/17  1046     History   Chief Complaint Chief Complaint  Patient presents with  . Cough  . Nasal Congestion    HPI Maria Nicholson is a 32 y.o. female, asthma, bronchitis, presents today for evaluation of acute onset of nasal congestion, productive cough, and shortness of breath for 2 days.  She notes cough was productive of yellow sputum with small flecks of blood yesterday and clear sputum today.  She notes shortness of breath worsens when she is coughing.  She denies chest pain.  She denies fevers or chills.  She notes nasal congestion and mild sore throat which she thinks is secondary to persistent cough.  Denies ear pain.  No neck pain or neck stiffness.  Denies abdominal pain, nausea, or vomiting.  She has tried over-the-counter cold medications, warm tea, and honey with some relief of her symptoms.  She is HIV positive and states that she has missed a couple of doses of her medication in the past few months.  She states that her viral load has been undetectable for the past 5 years, her infectious disease doctors in [place] and she is scheduled for her yearly exam in May.  Received a breathing treatment in the waiting room which she states was significantly helpful.  She has an albuterol inhaler at home which she has not been using.  The history is provided by the patient.    Past Medical History:  Diagnosis Date  . Asthma   . Bronchitis   . HIV (human immunodeficiency virus infection) ([place])   . No pertinent past medical history     Patient Active Problem List   Diagnosis Date Noted  . Eczema 11/17/2011    History reviewed. No pertinent surgical history.   OB History    Gravida  2   Para  0   Term      Preterm      AB  2   Living  0     SAB  1   TAB  1   Ectopic      Multiple      Live Births               Home Medications    Prior  to Admission medications   Medication Sig Start Date End Date Taking? Authorizing Provider  abacavir-lamiVUDine (EPZICOM) 600-300 MG tablet Take 1 tablet by mouth daily.    [provider]  azithromycin (ZITHROMAX) 250 MG tablet Take 1 tablet (250 mg total) by mouth daily. Take once daily for 4 days. Patient not taking: Reported on 08/24/2017 04/11/17   Langston Masker B, PA-C  benzonatate (TESSALON) 100 MG capsule Take 1 capsule (100 mg total) by mouth every 8 (eight) hours. Patient not taking: Reported on 03/26/2017 09/11/16   Shary Decamp, PA-C  darunavir-cobicistat (PREZCOBIX) 800-150 MG tablet Take 1 tablet by mouth daily with breakfast. Swallow whole. Do NOT crush, break or chew tablets. Take with food.    [provider]  loperamide (IMODIUM) 2 MG capsule Take 1 capsule (2 mg total) by mouth 4 (four) times daily as needed for diarrhea or loose stools. Patient not taking: Reported on 08/24/2017 06/29/17   Hedges, Dellis Filbert, PA-C  naproxen (NAPROSYN) 500 MG tablet Take 1 po BID with food prn pain 08/24/17   Rolland Porter, MD  ondansetron (ZOFRAN) 4 MG tablet Take 1 tablet (4 mg total)  by mouth every 8 (eight) hours as needed for nausea or vomiting. 08/24/17   Rolland Porter, MD  promethazine (PHENERGAN) 25 MG tablet Take 1 tablet (25 mg total) by mouth every 6 (six) hours as needed for nausea or vomiting. Patient not taking: Reported on 08/24/2017 03/27/17   Orpah Greek, MD  ranitidine (ZANTAC) 150 MG tablet Take 1 tablet (150 mg total) by mouth 2 (two) times daily. Patient not taking: Reported on 08/24/2017 03/27/17   Orpah Greek, MD  sodium chloride (OCEAN) 0.65 % SOLN nasal spray Place 1 spray into both nostrils as needed for congestion. 09/11/17   Renita Papa, PA-C    Family History Family History  Problem Relation Age of Onset  . Cancer Maternal Grandmother        throat  . Cancer Father 63       spinal   . Cancer Maternal Aunt 62       throat    Social  History Social History   Tobacco Use  . Smoking status: Current Every Day Smoker    Packs/day: 0.50    Years: 2.00    Pack years: 1.00    Types: Cigarettes  . Smokeless tobacco: Never Used  . Tobacco comment: decreased from one pack daily     Substance Use Topics  . Alcohol use: Yes    Comment: Occasionally   . Drug use: Yes    Types: Marijuana     Allergies   Patient has no known allergies.   Review of Systems Review of Systems  Constitutional: Negative for chills and fever.  HENT: Positive for congestion, sneezing and sore throat. Negative for drooling, ear pain and trouble swallowing.   Respiratory: Positive for cough and shortness of breath.   Cardiovascular: Negative for chest pain.  Gastrointestinal: Negative for abdominal pain, nausea and vomiting.  Musculoskeletal: Negative for neck pain and neck stiffness.     Physical Exam Updated Vital Signs BP 107/77   Pulse 89   Temp 99.1 F (37.3 C) (Oral)   Resp 16   LMP 08/27/2017 (Exact Date)   SpO2 97%   Physical Exam  Constitutional: She appears well-developed and well-nourished. No distress.  HENT:  Head: Normocephalic and atraumatic.  Right Ear: Tympanic membrane, external ear and ear canal normal.  Left Ear: Tympanic membrane, external ear and ear canal normal.  Nose: Mucosal edema present. No septal deviation or nasal septal hematoma. Right sinus exhibits no maxillary sinus tenderness and no frontal sinus tenderness. Left sinus exhibits no maxillary sinus tenderness and no frontal sinus tenderness.  Mouth/Throat: Uvula is midline, oropharynx is clear and moist and mucous membranes are normal. No trismus in the jaw. No tonsillar exudate.  Tolerating secretions without difficulty  Eyes: Pupils are equal, round, and reactive to light. Conjunctivae and EOM are normal. Right eye exhibits no discharge. Left eye exhibits no discharge.  Neck: Normal range of motion and full passive range of motion without pain.  Neck supple. No JVD present. No tracheal deviation present. No Brudzinski's sign and no Kernig's sign noted.  Bilateral anterior cervical lymphadenopathy  Cardiovascular: Normal rate, regular rhythm, normal heart sounds and intact distal pulses.  2+ radial and DP/PT pulses bl, negative Homan's bl   Pulmonary/Chest: Effort normal and breath sounds normal. She has no wheezes. She exhibits no tenderness.  Abdominal: Soft. Bowel sounds are normal. She exhibits no distension. There is no tenderness. There is no guarding.  Musculoskeletal: She exhibits no edema.  Lymphadenopathy:  She has cervical adenopathy.  Neurological: She is alert.  Skin: Skin is warm and dry. No erythema.  Psychiatric: She has a normal mood and affect. Her behavior is normal.  Nursing note and vitals reviewed.    ED Treatments / Results  Labs (all labs ordered are listed, but only abnormal results are displayed) Labs Reviewed - No data to display  EKG None  Radiology Dg Chest 2 View  Result Date: 09/11/2017 CLINICAL DATA:  Cough and headache EXAM: CHEST - 2 VIEW COMPARISON:  April 11, 2017 FINDINGS: Lungs are clear. Heart size and pulmonary vascularity are normal. No adenopathy. No pneumothorax. No bone lesions. IMPRESSION: No edema or consolidation. Electronically Signed   By: Lowella Grip III M.D.   On: 09/11/2017 12:19    Procedures Procedures (including critical care time)  Medications Ordered in ED Medications  albuterol (PROVENTIL) (2.5 MG/3ML) 0.083% nebulizer solution 5 mg (5 mg Nebulization Given 09/11/17 1121)     Initial Impression / Assessment and Plan / ED Course  I have reviewed the triage vital signs and the nursing notes.  Pertinent labs & imaging results that were available during my care of the patient were reviewed by me and considered in my medical decision making (see chart for details).     Patient presents with nasal congestion and cough as well as some shortness of  breath for 2 days.  She is afebrile, very mildly tachycardic with resolution on reevaluation and vital signs are otherwise stable.  Pt CXR reviewed by me negative for acute infiltrate. Patients symptoms are consistent with URI, likely viral etiology.  She is overall compliant with her HIV medication and has not had a detectable viral load in over 5 years.  She is [place] from [place] and is attempting to establish care with an infectious disease doctor here.  I doubt PE, PCP pneumonia, or other potentially life-threatening cardiopulmonary abnormality.  She has no chest pain. No meningeal signs to suggest meningitis.   Her tachycardia may be secondary to albuterol use.  Discussed that antibiotics are not indicated for viral infections. Pt will be discharged with symptomatic treatment.  Verbalizes understanding and is agreeable with plan. Pt is hemodynamically stable & in NAD prior to dc.  She will follow-up with an infectious disease doctor for reevaluation in the near future.  Discussed indications for return to the ED. Pt verbalized understanding of and agreement with plan and is safe for discharge home at this time.    Final Clinical Impressions(s) / ED Diagnoses   Final diagnoses:  Viral URI with cough    ED Discharge Orders        Ordered    sodium chloride (OCEAN) 0.65 % SOLN nasal spray  As needed     09/11/17 1242       Renita Papa, PA-C 09/11/17 1251    Noemi Chapel, MD 09/11/17 2120

## 2017-09-11 NOTE — ED Notes (Signed)
Patient transported to X-ray 

## 2017-09-11 NOTE — Discharge Instructions (Addendum)
Plenty of water and get plenty of rest.  Gargle warm salt water and spit it out for sore throat. May also use cough drops, warm teas, etc. Use nasal saline spray to decrease nasal congestion.  Over-the-counter allergy medication for nasal congestion and scratchy throat. Alternate 600 mg of ibuprofen and [id number] mg of Tylenol every 3 hours as needed for pain. Do not exceed 4000 mg of Tylenol daily.  Use your albuterol inhaler as needed for shortness of breath.   Followup with your primary care doctor in 5-7 days for recheck of ongoing symptoms.  Follow-up with an infectious disease doctor for reevaluation of your symptoms/recheck of viral load.  Return to emergency department for emergent changing or worsening of symptoms such as throat tightness, facial swelling, fever not controlled by ibuprofen or Tylenol,difficulty breathing, or chest pain.

## 2017-09-11 NOTE — ED Triage Notes (Signed)
Pt reports congestion with productive cough for 2 days. Pt states she had pneumonia in dec and wants to be sure it hasn't came back. Pt currently smokes 1/2 pack of cigarettes, HIV +.

## 2017-10-17 ENCOUNTER — Other Ambulatory Visit: Payer: Self-pay

## 2017-10-17 ENCOUNTER — Other Ambulatory Visit: Payer: Self-pay | Admitting: *Deleted

## 2017-10-17 ENCOUNTER — Ambulatory Visit: Payer: Self-pay

## 2017-10-17 DIAGNOSIS — Z113 Encounter for screening for infections with a predominantly sexual mode of transmission: Secondary | ICD-10-CM

## 2017-10-17 DIAGNOSIS — B20 Human immunodeficiency virus [HIV] disease: Secondary | ICD-10-CM

## 2017-10-17 MED ORDER — DARUNAVIR-COBICISTAT 800-150 MG PO TABS
1.0000 | ORAL_TABLET | Freq: Every day | ORAL | 5 refills | Status: DC
Start: 2017-10-17 — End: 2018-10-09

## 2017-10-17 MED ORDER — ABACAVIR SULFATE-LAMIVUDINE 600-300 MG PO TABS: 1.0000 | ORAL_TABLET | Freq: Every day | ORAL | 5 refills | Status: DC

## 2017-10-18 ENCOUNTER — Encounter: Payer: Self-pay | Admitting: Family

## 2017-10-18 LAB — CBC WITH DIFFERENTIAL/PLATELET
Basophils Absolute: 121 cells/uL (ref 0–200)
Basophils Relative: 1.7 %
Eosinophils Absolute: 149 cells/uL (ref 15–500)
Eosinophils Relative: 2.1 %
HCT: 34.5 % — ABNORMAL LOW (ref 35.0–45.0)
Hemoglobin: 11.3 g/dL — ABNORMAL LOW (ref 11.7–15.5)
Lymphs Abs: 2087 cells/uL (ref 850–3900)
MCH: 28.8 pg (ref 27.0–33.0)
MCHC: 32.8 g/dL (ref 32.0–36.0)
MCV: 88 fL (ref 80.0–100.0)
MPV: 10.3 fL (ref 7.5–12.5)
Monocytes Relative: 10.6 %
Neutro Abs: 3990 cells/uL (ref 1500–7800)
Neutrophils Relative %: 56.2 %
Platelets: 359 10*3/uL (ref 140–400)
RBC: 3.92 10*6/uL (ref 3.80–5.10)
RDW: 13.2 % (ref 11.0–15.0)
Total Lymphocyte: 29.4 %
WBC mixed population: 753 cells/uL (ref 200–950)
WBC: 7.1 10*3/uL (ref 3.8–10.8)

## 2017-10-18 LAB — COMPLETE METABOLIC PANEL WITH GFR
AG Ratio: 1.5 (calc) (ref 1.0–2.5)
ALT: 15 U/L (ref 6–29)
AST: 17 U/L (ref 10–30)
Albumin: 4.1 g/dL (ref 3.6–5.1)
Alkaline phosphatase (APISO): 65 U/L (ref 33–115)
BUN: 13 mg/dL (ref 7–25)
CO2: 26 mmol/L (ref 20–32)
Calcium: 9.2 mg/dL (ref 8.6–10.2)
Chloride: 105 mmol/L (ref 98–110)
Creat: 1.05 mg/dL (ref 0.50–1.10)
GFR, Est African American: 83 mL/min/{1.73_m2} (ref >=60)
GFR, Est Non African American: 72 mL/min/{1.73_m2} (ref >=60)
Globulin: 2.8 g/dL (calc) (ref 1.9–3.7)
Glucose, Bld: 77 mg/dL (ref 65–99)
Potassium: 4.1 mmol/L (ref 3.5–5.3)
Sodium: 138 mmol/L (ref 135–146)
Total Bilirubin: 0.2 mg/dL (ref 0.2–1.2)
Total Protein: 6.9 g/dL (ref 6.1–8.1)

## 2017-10-18 LAB — RPR: RPR Ser Ql: NONREACTIVE

## 2017-10-19 LAB — T-HELPER CELL (CD4) - (RCID CLINIC ONLY)
CD4 % Helper T Cell: 31 % — ABNORMAL LOW (ref 33–55)
CD4 T Cell Abs: 710 /uL (ref 400–2700)

## 2017-10-19 LAB — HIV-1 RNA ULTRAQUANT REFLEX TO GENTYP+
HIV 1 RNA Quant: 28 Copies/mL — ABNORMAL HIGH
HIV-1 RNA Quant, Log: 1.45 Log cps/mL — ABNORMAL HIGH

## 2017-10-19 LAB — URINE CYTOLOGY ANCILLARY ONLY
Chlamydia: NEGATIVE
Neisseria Gonorrhea: NEGATIVE

## 2017-11-06 ENCOUNTER — Ambulatory Visit (INDEPENDENT_AMBULATORY_CARE_PROVIDER_SITE_OTHER): Payer: Self-pay | Admitting: Pharmacist

## 2017-11-06 ENCOUNTER — Ambulatory Visit (INDEPENDENT_AMBULATORY_CARE_PROVIDER_SITE_OTHER): Payer: Self-pay | Admitting: Family

## 2017-11-06 ENCOUNTER — Encounter: Payer: Self-pay | Admitting: Family

## 2017-11-06 ENCOUNTER — Ambulatory Visit: Payer: Self-pay | Admitting: Licensed Clinical Social Worker

## 2017-11-06 VITALS — BP 105/71 | HR 90 | Temp 97.7°F | Ht 65.0 in | Wt 249.1 lb

## 2017-11-06 DIAGNOSIS — B20 Human immunodeficiency virus [HIV] disease: Secondary | ICD-10-CM

## 2017-11-06 DIAGNOSIS — F4329 Adjustment disorder with other symptoms: Secondary | ICD-10-CM

## 2017-11-06 DIAGNOSIS — Z23 Encounter for immunization: Secondary | ICD-10-CM

## 2017-11-06 NOTE — Progress Notes (Signed)
Subjective:    Patient ID: Maria Nicholson, female    DOB: Jun 09, 1986, 32 y.o.   MRN: [id number]  Chief Complaint  Patient presents with  . HIV Positive/AIDS    HPI:  Maria Nicholson is a 32 y.o. female who presents today to establish/transfer care for her HIV disease. Her mother is present for today's office visit and [name] has given verbal consent for discussion to occur with mother present.   Ms. Syler was initially diagnosed with HIV in April 2013 seeking testing after her boyfriend was found to be HIV positive. Her testing confirmed HIV-1. Initially started on medication in July of 2013 as she was told her CD4 count was high enough not to need medication. Her initial regimen included Truvada and another medication that she does not recall. She was switched to her current regimen of Epzicom and Prescobix secondary to experiencing body aches with her initial regimen. She is wishing to pursue pregnancy as she has fibroids currently. Taking her medication as prescribed and denies adverse side effects. Most recent blood work reviewed with the patient showing a viral load of 28 and a CD4 count of 710. She was negative for gonorrhea, chlamydia and syphilis. No genotype was available. She has received Pneumovax but has not received Prevnar. Denies fevers, chills, night sweats, headaches, changes in vision, neck pain/stiffness, nausea, diarrhea, vomiting, lesions or rashes.     Allergies  Allergen Reactions  . Amoxicillin Hives  . Coconut Flavor Swelling    Outpatient Medications Prior to Visit  Medication Sig Dispense Refill  . abacavir-lamiVUDine (EPZICOM) 600-300 MG tablet Take 1 tablet by mouth daily. 30 tablet 5  . darunavir-cobicistat (PREZCOBIX) 800-150 MG tablet Take 1 tablet by mouth daily with breakfast. Swallow whole. Do NOT crush, break or chew tablets. Take with food. 30 tablet 5  . azithromycin (ZITHROMAX) 250 MG tablet Take 1 tablet (250 mg total) by mouth daily. Take once  daily for 4 days. (Patient not taking: Reported on 08/24/2017) 4 tablet 0  . benzonatate (TESSALON) 100 MG capsule Take 1 capsule (100 mg total) by mouth every 8 (eight) hours. (Patient not taking: Reported on 03/26/2017) 21 capsule 0  . loperamide (IMODIUM) 2 MG capsule Take 1 capsule (2 mg total) by mouth 4 (four) times daily as needed for diarrhea or loose stools. (Patient not taking: Reported on 08/24/2017) 12 capsule 0  . naproxen (NAPROSYN) 500 MG tablet Take 1 po BID with food prn pain 30 tablet 0  . ondansetron (ZOFRAN) 4 MG tablet Take 1 tablet (4 mg total) by mouth every 8 (eight) hours as needed for nausea or vomiting. 10 tablet 0  . promethazine (PHENERGAN) 25 MG tablet Take 1 tablet (25 mg total) by mouth every 6 (six) hours as needed for nausea or vomiting. (Patient not taking: Reported on 08/24/2017) 30 tablet 0  . ranitidine (ZANTAC) 150 MG tablet Take 1 tablet (150 mg total) by mouth 2 (two) times daily. (Patient not taking: Reported on 08/24/2017) 60 tablet 0  . sodium chloride (OCEAN) 0.65 % SOLN nasal spray Place 1 spray into both nostrils as needed for congestion. 60 mL 0   No facility-administered medications prior to visit.      Past Medical History:  Diagnosis Date  . Asthma   . Bronchitis   . HIV (human immunodeficiency virus infection) ([place])   . No pertinent past medical history       History reviewed. No pertinent surgical history.    Family History  Problem Relation Age of Onset  . Cancer Maternal Grandmother        throat  . Cancer Father 65       spinal   . Cancer Maternal Aunt 62       throat      Social History   Socioeconomic History  . Marital status: Single    Spouse name: Not on file  . Number of children: Not on file  . Years of education: Not on file  . Highest education level: Not on file  Occupational History  . Not on file  Social Needs  . Financial resource strain: Not on file  . Food insecurity:    Worry: Not on file    Inability:  Not on file  . Transportation needs:    Medical: Not on file    Non-medical: Not on file  Tobacco Use  . Smoking status: Current Every Day Smoker    Packs/day: 0.50    Years: 2.00    Pack years: 1.00    Types: Cigarettes  . Smokeless tobacco: Never Used  . Tobacco comment: decreased from one pack daily     Substance and Sexual Activity  . Alcohol use: Yes    Comment: Occasionally   . Drug use: Yes    Types: Marijuana  . Sexual activity: Yes    Partners: Male    Birth control/protection: None    Comment: pt. given condoms  Lifestyle  . Physical activity:    Days per week: Not on file    Minutes per session: Not on file  . Stress: Not on file  Relationships  . Social connections:    Talks on phone: Not on file    Gets together: Not on file    Attends religious service: Not on file    Active member of club or organization: Not on file    Attends meetings of clubs or organizations: Not on file    Relationship status: Not on file  . Intimate partner violence:    Fear of current or ex partner: Not on file    Emotionally abused: Not on file    Physically abused: Not on file    Forced sexual activity: Not on file  Other Topics Concern  . Not on file  Social History Narrative  . Not on file      Review of Systems  Constitutional: Negative for appetite change, chills, diaphoresis, fatigue, fever and unexpected weight change.  Eyes:       Negative for acute change in vision  Respiratory: Negative for chest tightness, shortness of breath and wheezing.   Cardiovascular: Negative for chest pain.  Gastrointestinal: Negative for diarrhea, nausea and vomiting.  Genitourinary: Negative for dysuria, pelvic pain and vaginal discharge.  Musculoskeletal: Negative for neck pain and neck stiffness.  Skin: Negative for rash.  Neurological: Negative for seizures, syncope, weakness and headaches.  Hematological: Negative for adenopathy. Does not bruise/bleed easily.    Psychiatric/Behavioral: Negative for hallucinations.       Objective:    BP 105/71   Pulse 90   Temp 97.7 F (36.5 C)   Ht 5\' 5"  (1.651 m)   Wt 249 lb 1.9 oz (113 kg)   SpO2 91%   BMI 41.46 kg/m  Nursing note and vital signs reviewed.  Physical Exam  Constitutional: She is oriented to person, place, and time. She appears well-developed. No distress.  HENT:  Mouth/Throat: Oropharynx is clear and moist.  Eyes: Conjunctivae are normal.  Neck: Neck supple.  Cardiovascular: Normal rate, regular rhythm, normal heart sounds and intact distal pulses. Exam reveals no gallop and no friction rub.  No murmur heard. Pulmonary/Chest: Effort normal and breath sounds normal. No respiratory distress. She has no wheezes. She has no rales. She exhibits no tenderness.  Abdominal: Soft. Bowel sounds are normal. There is no tenderness.  Lymphadenopathy:    She has no cervical adenopathy.  Neurological: She is alert and oriented to person, place, and time.  Skin: Skin is warm and dry. No rash noted.  Psychiatric: She has a normal mood and affect. Her behavior is normal. Judgment and thought content normal.        Assessment & Plan:   Problem List Items Addressed This Visit      Other   HIV disease ([place]) - Primary    Ms. Eichler was initially diagnosed with HIV in April 2013 following finding her boyfriend had a positive test. Currently appears stable with her regimen of Epzicom and Prescobix with a viral load of 28 and CD4 count of 710. No signs/symptoms of opportunistic infection through history or physical exam. Expresses interest in becoming pregnant in the near future. This regimen will need to be adjusted if she does become pregnant. Previously received Pneuomvax in 2013. Prevnar updated today. Recheck Hepatitis B status with next blood work. Condoms provided. Continue current dosage of Epzicom and Prescobix with plan to follow up in 3 months or sooner if needed.        Other Visit  Diagnoses    Adjustment disorder with disturbance of emotion           I am having Maria Nicholson maintain her benzonatate, ranitidine, promethazine, azithromycin, loperamide, naproxen, ondansetron, sodium chloride, abacavir-lamiVUDine, and darunavir-cobicistat.   Follow-up: Return in about 3 months (around 02/06/2018), or if symptoms worsen or fail to improve.  Mauricio Po, [facility] for [facility] Disease

## 2017-11-06 NOTE — Progress Notes (Signed)
HPI: Maria Nicholson is a 32 y.o. female who presents to the [facility] today as a transfer HIV patient to initiate care with Marya Amsler.  Patient Active Problem List   Diagnosis Date Noted  . Eczema 11/17/2011    Patient's Medications  New Prescriptions   No medications on file  Previous Medications   ABACAVIR-LAMIVUDINE (EPZICOM) 600-300 MG TABLET    Take 1 tablet by mouth daily.   AZITHROMYCIN (ZITHROMAX) 250 MG TABLET    Take 1 tablet (250 mg total) by mouth daily. Take once daily for 4 days.   BENZONATATE (TESSALON) 100 MG CAPSULE    Take 1 capsule (100 mg total) by mouth every 8 (eight) hours.   DARUNAVIR-COBICISTAT (PREZCOBIX) 800-150 MG TABLET    Take 1 tablet by mouth daily with breakfast. Swallow whole. Do NOT crush, break or chew tablets. Take with food.   LOPERAMIDE (IMODIUM) 2 MG CAPSULE    Take 1 capsule (2 mg total) by mouth 4 (four) times daily as needed for diarrhea or loose stools.   NAPROXEN (NAPROSYN) 500 MG TABLET    Take 1 po BID with food prn pain   ONDANSETRON (ZOFRAN) 4 MG TABLET    Take 1 tablet (4 mg total) by mouth every 8 (eight) hours as needed for nausea or vomiting.   PROMETHAZINE (PHENERGAN) 25 MG TABLET    Take 1 tablet (25 mg total) by mouth every 6 (six) hours as needed for nausea or vomiting.   RANITIDINE (ZANTAC) 150 MG TABLET    Take 1 tablet (150 mg total) by mouth 2 (two) times daily.   SODIUM CHLORIDE (OCEAN) 0.65 % SOLN NASAL SPRAY    Place 1 spray into both nostrils as needed for congestion.  Modified Medications   No medications on file  Discontinued Medications   No medications on file    Allergies: Allergies  Allergen Reactions  . Amoxicillin Hives  . Coconut Flavor Swelling    Past Medical History: Past Medical History:  Diagnosis Date  . Asthma   . Bronchitis   . HIV (human immunodeficiency virus infection) ([place])   . No pertinent past medical history     Social History: Social History   Socioeconomic History  . Marital  status: Single    Spouse name: Not on file  . Number of children: Not on file  . Years of education: Not on file  . Highest education level: Not on file  Occupational History  . Not on file  Social Needs  . Financial resource strain: Not on file  . Food insecurity:    Worry: Not on file    Inability: Not on file  . Transportation needs:    Medical: Not on file    Non-medical: Not on file  Tobacco Use  . Smoking status: Current Every Day Smoker    Packs/day: 0.50    Years: 2.00    Pack years: 1.00    Types: Cigarettes  . Smokeless tobacco: Never Used  . Tobacco comment: decreased from one pack daily     Substance and Sexual Activity  . Alcohol use: Yes    Comment: Occasionally   . Drug use: Yes    Types: Marijuana  . Sexual activity: Yes    Partners: Male    Birth control/protection: None    Comment: pt. given condoms  Lifestyle  . Physical activity:    Days per week: Not on file    Minutes per session: Not on file  .  Stress: Not on file  Relationships  . Social connections:    Talks on phone: Not on file    Gets together: Not on file    Attends religious service: Not on file    Active member of club or organization: Not on file    Attends meetings of clubs or organizations: Not on file    Relationship status: Not on file  Other Topics Concern  . Not on file  Social History Narrative  . Not on file    Labs: Lab Results  Component Value Date   HIV1RNAQUANT 28 (H) 10/17/2017   HIV1RNAQUANT 253,686 (H) 11/15/2011   CD4TABS 710 10/17/2017   CD4TABS 170 (L) 11/15/2011    RPR and STI Lab Results  Component Value Date   LABRPR NON-REACTIVE 10/17/2017   LABRPR NON REAC 11/15/2011    STI Results GC CT  10/17/2017 Negative Negative  08/24/2017 Negative Negative  05/10/2011 - NEGATIVE    Hepatitis B Lab Results  Component Value Date   HEPBSAB NEG 11/15/2011   HEPBSAG NEGATIVE 11/15/2011   HEPBCAB NEG 11/15/2011   Hepatitis C No results found for:  HEPCAB, HCVRNAPCRQN Hepatitis A Lab Results  Component Value Date   HAV NEG 11/15/2011   Lipids: Lab Results  Component Value Date   CHOL 151 11/15/2011   TRIG 61 11/15/2011   HDL 44 11/15/2011   CHOLHDL 3.4 11/15/2011   VLDL 12 11/15/2011   [place] 95 11/15/2011    Current HIV Regimen: Epzicom + Prezcobix  Assessment: [name] is here today to initiate care for her HIV infection with Marya Amsler. She just moved from [place] back to [place] where she is originally from.  She has been stable taking Epzicom + Prezcobix.  Her HIV viral load is 28.  She recently applied for [facility], but she states that she just got a shipment of medications.  I introduced myself to her and gave her my card to call me if she ever has any issues taking/obtaining/tolerating her medications. She is happy with her regimen.  She is possibly thinking about becoming pregnant towards the end of the year/beginning of next year.  I told her that her Prezcobix would need to be split up and taken twice daily if she were to become pregnant (Prezista + Norvir BID), but there were other regimens she would be able to take if she wanted to switch.  I told her to call me if/when she becomes pregnant and we would figure it out.  No other questions for me.  Plan: - Continue Epzicom + Prezcobix - Call me if you become pregnant  Kamaria Lucia L. Makiyah Zentz, PharmD, [facility] 11/06/2017, 3:37 PM

## 2017-11-06 NOTE — Patient Instructions (Signed)
Nice to meet you.  Please continue to take your medication as prescribed.  We will see you back in 3 months.  You received the Prevnar vaccination today.  If you do become pregnant, please let us know.

## 2017-11-06 NOTE — Assessment & Plan Note (Signed)
Maria Nicholson was initially diagnosed with HIV in April 2013 following finding her boyfriend had a positive test. Currently appears stable with her regimen of Epzicom and Prescobix with a viral load of 28 and CD4 count of 710. No signs/symptoms of opportunistic infection through history or physical exam. Expresses interest in becoming pregnant in the near future. This regimen will need to be adjusted if she does become pregnant. Previously received Pneuomvax in 2013. Prevnar updated today. Recheck Hepatitis B status with next blood work. Condoms provided. Continue current dosage of Epzicom and Prescobix with plan to follow up in 3 months or sooner if needed.

## 2017-11-06 NOTE — BH Specialist Note (Signed)
Integrated Behavioral Health Initial Visit  MRN: [id number] Name: Maria Nicholson  Number of [facility] Clinician visits:: 1/6 Session Start time: 3:37pm  Session End time: 3:52pm Total time: 15 minutes  Type of Service: [place] Interpretor:No. Interpretor Name and Language: n/a   Warm Hand Off Completed.       SUBJECTIVE: Maria Nicholson is a 32 y.o. female accompanied by Mother Patient was referred by Terri Piedra for anxiety. Patient reports the following symptoms/concerns: anxiety is under control at the moment, but has had panic attacks in the recent past  OBJECTIVE: Mood: Euthymic and Affect: Constricted Risk of harm to self or others: No plan to harm self or others  LIFE CONTEXT: Patient is close with her mother, siblings, and nieces/nephews. She is currently in a stable relationship with a man who has a young child, and they are talking about getting pregnant in the near future and possibly getting married as well. She is currently doing well, but is a bit anxious that this may not remain the case and would like to begin counseling to address anxieties before they get bad again.  GOALS ADDRESSED: Patient will: 1. Reduce symptoms of: anxiety 2. Increase knowledge and/or ability of: counseling services     INTERVENTIONS: Interventions utilized: Supportive Counseling and Psychoeducation and/or Health Education   ASSESSMENT: Patient currently experiencing anxiety and nervousness about the fact that things are going well, when recently she was having panic attacks. 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 Patient shared recent experiences and how her life is going well now. She reports that she feels this is good timing to have a child and that her partner will be a good father for her child.  Counselor explained counseling services and commended patient on her desire to work on wellness maintenance when she is feeling well. Counselor discussed topics and methods of counseling with patient and her mother. Patient will set an appointment at the front desk when she checks out.   Patient may benefit from ongoing counseling for anxiety.  PLAN: 1. Patient will schedule return appointment.   Lillie Fragmin, LCSW

## 2018-02-01 ENCOUNTER — Ambulatory Visit: Payer: Self-pay | Admitting: Family

## 2018-03-22 ENCOUNTER — Ambulatory Visit: Payer: Self-pay | Admitting: Family

## 2018-04-19 ENCOUNTER — Ambulatory Visit: Payer: Self-pay

## 2018-04-19 ENCOUNTER — Other Ambulatory Visit: Payer: Self-pay

## 2018-04-19 DIAGNOSIS — B20 Human immunodeficiency virus [HIV] disease: Secondary | ICD-10-CM

## 2018-04-20 ENCOUNTER — Encounter: Payer: Self-pay | Admitting: Family

## 2018-04-20 LAB — T-HELPER CELL (CD4) - (RCID CLINIC ONLY)
CD4 % Helper T Cell: 34 % (ref 33–55)
CD4 T Cell Abs: 690 /uL (ref 400–2700)

## 2018-04-23 LAB — CBC WITH DIFFERENTIAL/PLATELET
Basophils Absolute: 88 cells/uL (ref 0–200)
Basophils Relative: 1.2 %
Eosinophils Absolute: 248 cells/uL (ref 15–500)
Eosinophils Relative: 3.4 %
HCT: 36.5 % (ref 35.0–45.0)
Hemoglobin: 11.8 g/dL (ref 11.7–15.5)
Lymphs Abs: 2292 cells/uL (ref 850–3900)
MCH: 28.9 pg (ref 27.0–33.0)
MCHC: 32.3 g/dL (ref 32.0–36.0)
MCV: 89.2 fL (ref 80.0–100.0)
MPV: 10.6 fL (ref 7.5–12.5)
Monocytes Relative: 12.5 %
Neutro Abs: 3760 cells/uL (ref 1500–7800)
Neutrophils Relative %: 51.5 %
Platelets: 378 10*3/uL (ref 140–400)
RBC: 4.09 10*6/uL (ref 3.80–5.10)
RDW: 13.7 % (ref 11.0–15.0)
Total Lymphocyte: 31.4 %
WBC mixed population: 913 cells/uL (ref 200–950)
WBC: 7.3 10*3/uL (ref 3.8–10.8)

## 2018-04-23 LAB — COMPLETE METABOLIC PANEL WITH GFR
AG Ratio: 1.2 (calc) (ref 1.0–2.5)
ALT: 10 U/L (ref 6–29)
AST: 13 U/L (ref 10–30)
Albumin: 4.1 g/dL (ref 3.6–5.1)
Alkaline phosphatase (APISO): 64 U/L (ref 33–115)
BUN: 16 mg/dL (ref 7–25)
CO2: 26 mmol/L (ref 20–32)
Calcium: 9.5 mg/dL (ref 8.6–10.2)
Chloride: 103 mmol/L (ref 98–110)
Creat: 0.99 mg/dL (ref 0.50–1.10)
GFR, Est African American: 89 mL/min/{1.73_m2} (ref >=60)
GFR, Est Non African American: 76 mL/min/{1.73_m2} (ref >=60)
Globulin: 3.3 g/dL (calc) (ref 1.9–3.7)
Glucose, Bld: 88 mg/dL (ref 65–99)
Potassium: 4.2 mmol/L (ref 3.5–5.3)
Sodium: 137 mmol/L (ref 135–146)
Total Bilirubin: 0.3 mg/dL (ref 0.2–1.2)
Total Protein: 7.4 g/dL (ref 6.1–8.1)

## 2018-04-23 LAB — HIV-1 RNA QUANT-NO REFLEX-BLD
HIV 1 RNA Quant: <20 copies/mL
HIV-1 RNA Quant, Log: <1.3 Log copies/mL

## 2018-05-14 ENCOUNTER — Encounter: Payer: Self-pay | Admitting: Family

## 2018-05-14 ENCOUNTER — Ambulatory Visit: Payer: Self-pay

## 2018-05-14 NOTE — Progress Notes (Deleted)
Subjective:    Patient ID: Maria Nicholson, female    DOB: October 18, 1985, 32 y.o.   MRN: [id number]  No chief complaint on file.    HPI:  Maria Nicholson is a 32 y.o. female who presents today for routine follow up of HIV disease.  Ms. Trant was last seen in the office on 11/06/17 with her blood work showing a viral load that was undetectable and CD4 count of 710. She did express interest about becoming pregnant in the future. Continued on Prezcobix and Epzicom. Most recent blood work completed on 10/31 shows a viral load that remains undetectable and CD4 count of 690. Kidney function, liver function and electrolytes are within the normal ranges. Health maintanence is due for influenza and Menveo vaccinations as well as cervical cancer screening.    Allergies  Allergen Reactions  . Amoxicillin Hives  . Coconut Flavor Swelling      Outpatient Medications Prior to Visit  Medication Sig Dispense Refill  . abacavir-lamiVUDine (EPZICOM) 600-300 MG tablet Take 1 tablet by mouth daily. 30 tablet 5  . azithromycin (ZITHROMAX) 250 MG tablet Take 1 tablet (250 mg total) by mouth daily. Take once daily for 4 days. (Patient not taking: Reported on 08/24/2017) 4 tablet 0  . benzonatate (TESSALON) 100 MG capsule Take 1 capsule (100 mg total) by mouth every 8 (eight) hours. (Patient not taking: Reported on 03/26/2017) 21 capsule 0  . darunavir-cobicistat (PREZCOBIX) 800-150 MG tablet Take 1 tablet by mouth daily with breakfast. Swallow whole. Do NOT crush, break or chew tablets. Take with food. 30 tablet 5  . loperamide (IMODIUM) 2 MG capsule Take 1 capsule (2 mg total) by mouth 4 (four) times daily as needed for diarrhea or loose stools. (Patient not taking: Reported on 08/24/2017) 12 capsule 0  . naproxen (NAPROSYN) 500 MG tablet Take 1 po BID with food prn pain 30 tablet 0  . ondansetron (ZOFRAN) 4 MG tablet Take 1 tablet (4 mg total) by mouth every 8 (eight) hours as needed for nausea or vomiting. 10  tablet 0  . promethazine (PHENERGAN) 25 MG tablet Take 1 tablet (25 mg total) by mouth every 6 (six) hours as needed for nausea or vomiting. (Patient not taking: Reported on 08/24/2017) 30 tablet 0  . ranitidine (ZANTAC) 150 MG tablet Take 1 tablet (150 mg total) by mouth 2 (two) times daily. (Patient not taking: Reported on 08/24/2017) 60 tablet 0  . sodium chloride (OCEAN) 0.65 % SOLN nasal spray Place 1 spray into both nostrils as needed for congestion. 60 mL 0   No facility-administered medications prior to visit.      Past Medical History:  Diagnosis Date  . Asthma   . Bronchitis   . HIV (human immunodeficiency virus infection) ([place])   . No pertinent past medical history      No past surgical history on file.     Review of Systems  Constitutional: Negative for appetite change, chills, diaphoresis, fatigue, fever and unexpected weight change.  Eyes:       Negative for acute change in vision  Respiratory: Negative for chest tightness, shortness of breath and wheezing.   Cardiovascular: Negative for chest pain.  Gastrointestinal: Negative for diarrhea, nausea and vomiting.  Genitourinary: Negative for dysuria, pelvic pain and vaginal discharge.  Musculoskeletal: Negative for neck pain and neck stiffness.  Skin: Negative for rash.  Neurological: Negative for seizures, syncope, weakness and headaches.  Hematological: Negative for adenopathy. Does not bruise/bleed easily.  Psychiatric/Behavioral:  Negative for hallucinations.      Objective:    There were no vitals taken for this visit. Nursing note and vital signs reviewed.  Physical Exam  Constitutional: She is oriented to person, place, and time. She appears well-developed. No distress.  HENT:  Mouth/Throat: Oropharynx is clear and moist.  Eyes: Conjunctivae are normal.  Neck: Neck supple.  Cardiovascular: Normal rate, regular rhythm, normal heart sounds and intact distal pulses. Exam reveals no gallop and no friction  rub.  No murmur heard. Pulmonary/Chest: Effort normal and breath sounds normal. No respiratory distress. She has no wheezes. She has no rales. She exhibits no tenderness.  Abdominal: Soft. Bowel sounds are normal. There is no tenderness.  Lymphadenopathy:    She has no cervical adenopathy.  Neurological: She is alert and oriented to person, place, and time.  Skin: Skin is warm and dry. No rash noted.  Psychiatric: She has a normal mood and affect. Her behavior is normal. Judgment and thought content normal.       Assessment & Plan:   Problem List Items Addressed This Visit    None       I am having Maria Nicholson maintain her benzonatate, ranitidine, promethazine, azithromycin, loperamide, naproxen, ondansetron, sodium chloride, abacavir-lamiVUDine, and darunavir-cobicistat.   No orders of the defined types were placed in this encounter.    Follow-up: No follow-ups on file.   Terri Piedra, MSN, FNP-C Nurse Practitioner [facility] for Infectious Disease [facility] Office phone: [phone] Pager: [place] number: [phone]

## 2018-05-22 ENCOUNTER — Telehealth: Payer: Self-pay

## 2018-05-22 NOTE — Telephone Encounter (Signed)
Called patient to schedule PAP appointment. Patient is also due for an appointment with Terri Piedra, Np and lab. Patient was able to schedule appointments for 12/26 with [facility] for pap.  [name]

## 2018-05-29 ENCOUNTER — Other Ambulatory Visit: Payer: Self-pay | Admitting: *Deleted

## 2018-05-29 DIAGNOSIS — B20 Human immunodeficiency virus [HIV] disease: Secondary | ICD-10-CM

## 2018-05-31 ENCOUNTER — Other Ambulatory Visit: Payer: Self-pay

## 2018-06-14 ENCOUNTER — Ambulatory Visit: Payer: Self-pay | Admitting: Infectious Diseases

## 2018-06-14 ENCOUNTER — Ambulatory Visit: Payer: Self-pay | Admitting: Family

## 2018-06-14 NOTE — Progress Notes (Deleted)
Subjective:    Patient ID: Maria Nicholson, female    DOB: 02/26/1986, 32 y.o.   MRN: [id number]  No chief complaint on file.    HPI:  Maria Nicholson is a 32 y.o. female who presents today for routine HIV follow up and Pap smear.   Maria Nicholson was last seen in the office on 10/27/17 to establish care for HIV disease with a viral load that was undetectable and CD4 count of 710. She was counseled about pregnancy at that time and advised in need of changing medication if she became pregnant. Her most recent blood work completed on 04/19/18 shows continued viral suppression and remain undetectable with a CD4 count of 690. Health maintenance due includes Menveo, HPV, and influenza. She is scheduled to receive a Pap smear for cervical cancer screening today.    Allergies  Allergen Reactions  . Amoxicillin Hives  . Coconut Flavor Swelling      Outpatient Medications Prior to Visit  Medication Sig Dispense Refill  . abacavir-lamiVUDine (EPZICOM) 600-300 MG tablet Take 1 tablet by mouth daily. 30 tablet 5  . azithromycin (ZITHROMAX) 250 MG tablet Take 1 tablet (250 mg total) by mouth daily. Take once daily for 4 days. (Patient not taking: Reported on 08/24/2017) 4 tablet 0  . benzonatate (TESSALON) 100 MG capsule Take 1 capsule (100 mg total) by mouth every 8 (eight) hours. (Patient not taking: Reported on 03/26/2017) 21 capsule 0  . darunavir-cobicistat (PREZCOBIX) 800-150 MG tablet Take 1 tablet by mouth daily with breakfast. Swallow whole. Do NOT crush, break or chew tablets. Take with food. 30 tablet 5  . loperamide (IMODIUM) 2 MG capsule Take 1 capsule (2 mg total) by mouth 4 (four) times daily as needed for diarrhea or loose stools. (Patient not taking: Reported on 08/24/2017) 12 capsule 0  . naproxen (NAPROSYN) 500 MG tablet Take 1 po BID with food prn pain 30 tablet 0  . ondansetron (ZOFRAN) 4 MG tablet Take 1 tablet (4 mg total) by mouth every 8 (eight) hours as needed for nausea or  vomiting. 10 tablet 0  . promethazine (PHENERGAN) 25 MG tablet Take 1 tablet (25 mg total) by mouth every 6 (six) hours as needed for nausea or vomiting. (Patient not taking: Reported on 08/24/2017) 30 tablet 0  . ranitidine (ZANTAC) 150 MG tablet Take 1 tablet (150 mg total) by mouth 2 (two) times daily. (Patient not taking: Reported on 08/24/2017) 60 tablet 0  . sodium chloride (OCEAN) 0.65 % SOLN nasal spray Place 1 spray into both nostrils as needed for congestion. 60 mL 0   No facility-administered medications prior to visit.      Past Medical History:  Diagnosis Date  . Asthma   . Bronchitis   . HIV (human immunodeficiency virus infection) ([place])   . No pertinent past medical history      No past surgical history on file.     Review of Systems  Constitutional: Negative for appetite change, chills, diaphoresis, fatigue, fever and unexpected weight change.  Eyes:       Negative for acute change in vision  Respiratory: Negative for chest tightness, shortness of breath and wheezing.   Cardiovascular: Negative for chest pain.  Gastrointestinal: Negative for diarrhea, nausea and vomiting.  Genitourinary: Negative for dysuria, pelvic pain and vaginal discharge.  Musculoskeletal: Negative for neck pain and neck stiffness.  Skin: Negative for rash.  Neurological: Negative for seizures, syncope, weakness and headaches.  Hematological: Negative for adenopathy. Does  not bruise/bleed easily.  Psychiatric/Behavioral: Negative for hallucinations.      Objective:    There were no vitals taken for this visit. Nursing note and vital signs reviewed.  Physical Exam Constitutional:      General: [name]    Appearance: She is well-developed.  Eyes:     Conjunctiva/sclera: Conjunctivae normal.  Neck:     Musculoskeletal: Neck supple.  Cardiovascular:     Rate and Rhythm: Normal rate and regular rhythm.     Heart sounds: Normal heart sounds. No murmur. No friction  rub. No gallop.   Pulmonary:     Effort: Pulmonary effort is normal. No respiratory distress.     Breath sounds: Normal breath sounds. No wheezing or rales.  Chest:     Chest wall: No tenderness.  Abdominal:     General: Bowel sounds are normal.     Palpations: Abdomen is soft.     Tenderness: There is no abdominal tenderness.  Lymphadenopathy:     Cervical: No cervical adenopathy.  Skin:    General: Skin is warm and dry.     Findings: No rash.  Neurological:     Mental Status: She is alert and oriented to person, place, and time.  Psychiatric:        Behavior: Behavior normal.        Thought Content: Thought content normal.        Judgment: Judgment normal.        Assessment & Plan:   Problem List Items Addressed This Visit    None       I am having Maria Nicholson maintain her benzonatate, ranitidine, promethazine, azithromycin, loperamide, naproxen, ondansetron, sodium chloride, abacavir-lamiVUDine, and darunavir-cobicistat.   No orders of the defined types were placed in this encounter.    Follow-up: No follow-ups on file.   Terri Piedra, MSN, FNP-C Nurse Practitioner [facility] for Infectious Disease [place] Office phone: [phone] Pager: [place] number: [phone]

## 2018-07-25 ENCOUNTER — Ambulatory Visit (INDEPENDENT_AMBULATORY_CARE_PROVIDER_SITE_OTHER): Payer: Self-pay | Admitting: Pharmacist

## 2018-07-25 DIAGNOSIS — B20 Human immunodeficiency virus [HIV] disease: Secondary | ICD-10-CM

## 2018-07-25 NOTE — Progress Notes (Signed)
HPI: Maria Nicholson is a 33 y.o. female who presents to the [facility] clinic for follow-up of their HIV infection.   Patient Active Problem List   Diagnosis Date Noted  . HIV disease ([place]) 11/06/2017  . Eczema 11/17/2011    Patient's Medications  New Prescriptions   No medications on file  Previous Medications   ABACAVIR-LAMIVUDINE (EPZICOM) 600-300 MG TABLET    Take 1 tablet by mouth daily.   DARUNAVIR-COBICISTAT (PREZCOBIX) 800-150 MG TABLET    Take 1 tablet by mouth daily with breakfast. Swallow whole. Do NOT crush, break or chew tablets. Take with food.   ONDANSETRON (ZOFRAN) 4 MG TABLET    Take 1 tablet (4 mg total) by mouth every 8 (eight) hours as needed for nausea or vomiting.  Modified Medications   No medications on file  Discontinued Medications   AZITHROMYCIN (ZITHROMAX) 250 MG TABLET    Take 1 tablet (250 mg total) by mouth daily. Take once daily for 4 days.   BENZONATATE (TESSALON) 100 MG CAPSULE    Take 1 capsule (100 mg total) by mouth every 8 (eight) hours.   LOPERAMIDE (IMODIUM) 2 MG CAPSULE    Take 1 capsule (2 mg total) by mouth 4 (four) times daily as needed for diarrhea or loose stools.   NAPROXEN (NAPROSYN) 500 MG TABLET    Take 1 po BID with food prn pain   PROMETHAZINE (PHENERGAN) 25 MG TABLET    Take 1 tablet (25 mg total) by mouth every 6 (six) hours as needed for nausea or vomiting.   RANITIDINE (ZANTAC) 150 MG TABLET    Take 1 tablet (150 mg total) by mouth 2 (two) times daily.   SODIUM CHLORIDE (OCEAN) 0.65 % SOLN NASAL SPRAY    Place 1 spray into both nostrils as needed for congestion.    Allergies: Allergies  Allergen Reactions  . Amoxicillin Hives  . Coconut Flavor Swelling    Past Medical History: Past Medical History:  Diagnosis Date  . Asthma   . Bronchitis   . HIV (human immunodeficiency virus infection) ([place])   . No pertinent past medical history     Social History: Social History   Socioeconomic History  . Marital status:  Single    Spouse name: Not on file  . Number of children: Not on file  . Years of education: Not on file  . Highest education level: Not on file  Occupational History  . Not on file  Social Needs  . Financial resource strain: Not on file  . Food insecurity:    Worry: Not on file    Inability: Not on file  . Transportation needs:    Medical: Not on file    Non-medical: Not on file  Tobacco Use  . Smoking status: Current Every Day Smoker    Packs/day: 0.50    Years: 2.00    Pack years: 1.00    Types: Cigarettes  . Smokeless tobacco: Never Used  . Tobacco comment: decreased from one pack daily     Substance and Sexual Activity  . Alcohol use: Yes    Comment: Occasionally   . Drug use: Yes    Types: Marijuana  . Sexual activity: Yes    Partners: Male    Birth control/protection: None    Comment: pt. given condoms  Lifestyle  . Physical activity:    Days per week: Not on file    Minutes per session: Not on file  . Stress: Not on  file  Relationships  . Social connections:    Talks on phone: Not on file    Gets together: Not on file    Attends religious service: Not on file    Active member of club or organization: Not on file    Attends meetings of clubs or organizations: Not on file    Relationship status: Not on file  Other Topics Concern  . Not on file  Social History Narrative  . Not on file    Labs: Lab Results  Component Value Date   HIV1RNAQUANT <20 NOT DETECTED 04/19/2018   HIV1RNAQUANT 28 (H) 10/17/2017   HIV1RNAQUANT 253,686 (H) 11/15/2011   CD4TABS 690 04/19/2018   CD4TABS 710 10/17/2017   CD4TABS 170 (L) 11/15/2011    RPR and STI Lab Results  Component Value Date   LABRPR NON-REACTIVE 10/17/2017   LABRPR NON REAC 11/15/2011    STI Results GC CT  10/17/2017 Negative Negative  08/24/2017 Negative Negative  05/10/2011 - NEGATIVE    Hepatitis B Lab Results  Component Value Date   HEPBSAB NEG 11/15/2011   HEPBSAG NEGATIVE 11/15/2011    HEPBCAB NEG 11/15/2011   Hepatitis C No results found for: HEPCAB, HCVRNAPCRQN Hepatitis A Lab Results  Component Value Date   HAV NEG 11/15/2011   Lipids: Lab Results  Component Value Date   CHOL 151 11/15/2011   TRIG 61 11/15/2011   HDL 44 11/15/2011   CHOLHDL 3.4 11/15/2011   VLDL 12 11/15/2011   Lucien 95 11/15/2011    Current HIV Regimen: Epzicom + Prezcobix  Assessment: Maria Nicholson is here today for HIV follow-up.  She has no showed several times since she initiate care with Marya Amsler back in May 2019.  She came in today to renew HMAP.  She had labs drawn back in October that showed an undetectable HIV viral load and a healthy CD4 count. She tells me she hasn't missed doses and isn't having any issues.  She states she has been going through a lot with her fibroids and trying to get pregnant.  She states she has been trying for 2+ years now.  Told her to let me know if/when she does become pregnant so that I can switch her HIV medications. I will put her back on Greg's schedule in hopes that she doesn't no show him again.  Will also reschedule her for a PAP smear appointment with Colletta [place].  Plan: - Continue Epzicom and Prezcobix - F/u with Colletta [place] 3/9 at 11am for PAP - F/u with Marya Amsler 6/9 at [facility]. Georgie Eduardo, PharmD, BCIDP, AAHIVP, [place] for Infectious Disease 07/25/2018, 2:07 PM

## 2018-08-08 ENCOUNTER — Encounter: Payer: Self-pay | Admitting: Family

## 2018-08-27 ENCOUNTER — Ambulatory Visit: Payer: Self-pay | Admitting: Infectious Diseases

## 2018-09-15 ENCOUNTER — Emergency Department (HOSPITAL_COMMUNITY)
Admission: EM | Admit: 2018-09-15 | Discharge: 2018-09-16 | Disposition: A | Discharge status: Home / Self Care | Payer: Self-pay | Attending: Emergency Medicine | Admitting: Emergency Medicine

## 2018-09-15 ENCOUNTER — Other Ambulatory Visit: Payer: Self-pay

## 2018-09-15 ENCOUNTER — Encounter (HOSPITAL_COMMUNITY): Payer: Self-pay | Admitting: Emergency Medicine

## 2018-09-15 ENCOUNTER — Emergency Department (HOSPITAL_COMMUNITY): Payer: Self-pay

## 2018-09-15 DIAGNOSIS — J45909 Unspecified asthma, uncomplicated: Secondary | ICD-10-CM | POA: Insufficient documentation

## 2018-09-15 DIAGNOSIS — N939 Abnormal uterine and vaginal bleeding, unspecified: Secondary | ICD-10-CM | POA: Insufficient documentation

## 2018-09-15 DIAGNOSIS — R05 Cough: Secondary | ICD-10-CM | POA: Insufficient documentation

## 2018-09-15 DIAGNOSIS — Z79899 Other long term (current) drug therapy: Secondary | ICD-10-CM | POA: Insufficient documentation

## 2018-09-15 DIAGNOSIS — Z21 Asymptomatic human immunodeficiency virus [HIV] infection status: Secondary | ICD-10-CM | POA: Insufficient documentation

## 2018-09-15 DIAGNOSIS — F17210 Nicotine dependence, cigarettes, uncomplicated: Secondary | ICD-10-CM | POA: Insufficient documentation

## 2018-09-15 DIAGNOSIS — R059 Cough, unspecified: Secondary | ICD-10-CM

## 2018-09-15 LAB — CBC WITH DIFFERENTIAL/PLATELET
Abs Immature Granulocytes: 0.01 10*3/uL (ref 0.00–0.07)
Basophils Absolute: 0.1 10*3/uL (ref 0.0–0.1)
Basophils Relative: 1 %
Eosinophils Absolute: 0.1 10*3/uL (ref 0.0–0.5)
Eosinophils Relative: 2 %
HCT: 30.7 % — ABNORMAL LOW (ref 36.0–46.0)
Hemoglobin: 9.3 g/dL — ABNORMAL LOW (ref 12.0–15.0)
Immature Granulocytes: 0 %
Lymphocytes Relative: 33 %
Lymphs Abs: 2 10*3/uL (ref 0.7–4.0)
MCH: 26.3 pg (ref 26.0–34.0)
MCHC: 30.3 g/dL (ref 30.0–36.0)
MCV: 86.7 fL (ref 80.0–100.0)
Monocytes Absolute: 0.9 10*3/uL (ref 0.1–1.0)
Monocytes Relative: 14 %
Neutro Abs: 3 10*3/uL (ref 1.7–7.7)
Neutrophils Relative %: 50 %
Platelets: 318 10*3/uL (ref 150–400)
RBC: 3.54 MIL/uL — ABNORMAL LOW (ref 3.87–5.11)
RDW: 14.9 % (ref 11.5–15.5)
WBC: 6.1 10*3/uL (ref 4.0–10.5)
nRBC: 0 % (ref 0.0–0.2)

## 2018-09-15 LAB — WET PREP, GENITAL
Clue Cells Wet Prep HPF POC: NONE SEEN
Sperm: NONE SEEN
Trich, Wet Prep: NONE SEEN
Yeast Wet Prep HPF POC: NONE SEEN

## 2018-09-15 LAB — COMPREHENSIVE METABOLIC PANEL
ALT: 13 U/L (ref 0–44)
AST: 13 U/L — ABNORMAL LOW (ref 15–41)
Albumin: 3.4 g/dL — ABNORMAL LOW (ref 3.5–5.0)
Alkaline Phosphatase: 55 U/L (ref 38–126)
Anion gap: 6 (ref 5–15)
BUN: 12 mg/dL (ref 6–20)
CO2: 21 mmol/L — ABNORMAL LOW (ref 22–32)
Calcium: 8.6 mg/dL — ABNORMAL LOW (ref 8.9–10.3)
Chloride: 106 mmol/L (ref 98–111)
Creatinine, Ser: 0.92 mg/dL (ref 0.44–1.00)
GFR calc Af Amer: >60 mL/min (ref >60)
GFR calc non Af Amer: >60 mL/min (ref >60)
Glucose, Bld: 103 mg/dL — ABNORMAL HIGH (ref 70–99)
Potassium: 3.8 mmol/L (ref 3.5–5.1)
Sodium: 133 mmol/L — ABNORMAL LOW (ref 135–145)
Total Bilirubin: 0.6 mg/dL (ref 0.3–1.2)
Total Protein: 6.7 g/dL (ref 6.5–8.1)

## 2018-09-15 LAB — LIPASE, BLOOD: Lipase: 29 U/L (ref 11–51)

## 2018-09-15 NOTE — ED Triage Notes (Signed)
Pt reports heavy vaginal bleeding on 3/24. Reports having period on 3/4. Pt reports using 8 maxipads at a time and hanging them every hour. Pt reports hx of fibroids. Pt reports travel to [facility] 3/4-3/8. Pt reports cough x 2-3 days, reports having sinus problems. Pt denies chills, unsure if she had fever, reports feeling fatigued. Pt denies sick contact. Pt denies pain.

## 2018-09-15 NOTE — ED Notes (Signed)
phlebotomist drawing hCG

## 2018-09-15 NOTE — ED Provider Notes (Signed)
[facility] EMERGENCY DEPARTMENT Provider Note   CSN: [id number] Arrival date & time: 09/15/18  2135    History   Chief Complaint Chief Complaint  Patient presents with  . Vaginal Bleeding    HPI LONNI DIRDEN is a 33 y.o. female with PMHx asthma and HIV who presents to the ED with multiple complaints. She currently complains of a productive cough x 2-3 days, SOB, sore throat, and sinus pressure. Pt endorses hx of asthma and "sinus problems" but states this feels different. She traveled to [place] from 03/04 - 03/08 and was concerned she could have been exposed to coronavirus. No known contact with COVID19 positive patient. No fever, chills.   She also complains of vaginal bleeding x 4 days. Pt has hx of fibroids and states she typically has heavy periods but this has been worse than normal. She states she has been through 24 super pads per day for the last 2 days. She also complains of lightheadedness and 1 episode of blurry vision 2 days ago. Pt's LNMP was 03/02. She reports that she typically has her period regularly every 28 days give or take a day but has never been early like this. Denies risk of being pregnant given she has not had intercourse since her LNMP on 03/02 although she did have unprotected intercourse 1-2 days prior to menses. Also endorses urinary frequency, dysuria, and 1 episode of vomiting earlier today.        Past Medical History:  Diagnosis Date  . Asthma   . Bronchitis   . HIV (human immunodeficiency virus infection) ([place])   . No pertinent past medical history     Patient Active Problem List   Diagnosis Date Noted  . HIV disease ([employer]) 11/06/2017  . Eczema 11/17/2011    No past surgical history on file.   OB History    Gravida  2   Para  0   Term      Preterm      AB  2   Living  0     SAB  1   TAB  1   Ectopic      Multiple      Live Births               Home Medications    Prior to Admission  medications   Medication Sig Start Date End Date Taking? Authorizing Provider  abacavir-lamiVUDine (EPZICOM) 600-300 MG tablet Take 1 tablet by mouth daily. Patient taking differently: Take 1 tablet by mouth at bedtime.  10/17/17  Yes [place] Callas, NP  acetaminophen (TYLENOL) 500 MG tablet Take 500 mg by mouth every 6 (six) hours as needed for headache (pain).   Yes [provider]  albuterol (PROVENTIL HFA;VENTOLIN HFA) 108 (90 Base) MCG/ACT inhaler Inhale into the lungs every 6 (six) hours as needed for wheezing or shortness of breath (seasonal allergies).   Yes [provider]  darunavir-cobicistat (PREZCOBIX) 800-150 MG tablet Take 1 tablet by mouth daily with breakfast. Swallow whole. Do NOT crush, break or chew tablets. Take with food. Patient taking differently: Take 1 tablet by mouth at bedtime. Swallow whole. Do NOT crush, break or chew tablets. Take with food.  10/17/17  Yes [place] Callas, NP  ondansetron (ZOFRAN) 4 MG tablet Take 1 tablet (4 mg total) by mouth every 8 (eight) hours as needed for nausea or vomiting. Patient not taking: Reported on 09/15/2018 08/24/17   Rolland Porter, MD  Family History Family History  Problem Relation Age of Onset  . Cancer Maternal Grandmother        throat  . Cancer Father 49       spinal   . Cancer Maternal Aunt 62       throat    Social History Social History   Tobacco Use  . Smoking status: Current Every Day Smoker    Packs/day: 0.50    Years: 2.00    Pack years: 1.00    Types: Cigarettes  . Smokeless tobacco: Never Used  . Tobacco comment: decreased from one pack daily     Substance Use Topics  . Alcohol use: Yes    Comment: Occasionally   . Drug use: Yes    Types: Marijuana     Allergies   Amoxicillin and Coconut flavor   Review of Systems Review of Systems  Constitutional: Negative for chills and fever.  HENT: Positive for sinus pressure and sore throat. Negative for ear pain.   Eyes: Positive  for visual disturbance.  Respiratory: Positive for cough and shortness of breath.   Cardiovascular: Negative for leg swelling.  Gastrointestinal: Positive for nausea and vomiting. Negative for abdominal pain and diarrhea.  Genitourinary: Positive for dysuria, frequency and vaginal bleeding. Negative for flank pain.  Musculoskeletal: Negative for back pain.  Skin: Negative for rash.  Allergic/Immunologic: Positive for immunocompromised state.  Neurological: Positive for light-headedness. Negative for syncope.     Physical Exam Updated Vital Signs BP 113/67 (BP Location: Right Arm)   Pulse 87   Temp 99.1 F (37.3 C) (Oral)   Resp 18   LMP 09/11/2018   SpO2 99%   Physical Exam Vitals signs and nursing note reviewed. Exam conducted with a chaperone present.  Constitutional:      Appearance: Normal appearance. She is not ill-appearing or diaphoretic.  HENT:     Head: Normocephalic and atraumatic.     Right Ear: Tympanic membrane normal.     Left Ear: Tympanic membrane normal.     Nose: Nose normal.     Mouth/Throat:     Mouth: Mucous membranes are moist.     Pharynx: No oropharyngeal exudate or posterior oropharyngeal erythema.  Eyes:     Conjunctiva/sclera: Conjunctivae normal.     Pupils: Pupils are equal, round, and reactive to light.  Neck:     Musculoskeletal: Normal range of motion and neck supple.  Cardiovascular:     Rate and Rhythm: Normal rate and regular rhythm.  Pulmonary:     Effort: Pulmonary effort is normal.     Breath sounds: Normal breath sounds. No wheezing, rhonchi or rales.  Abdominal:     Palpations: Abdomen is soft.     Tenderness: There is no abdominal tenderness. There is no right CVA tenderness, left CVA tenderness, guarding or rebound.  Genitourinary:    Comments: Chaperone present RN Denyse Dago. Moderate amount of blood in vaginal vault. Os is closed. No discharge appreciated. No adnexal tenderness or cervical motion tenderness.   Lymphadenopathy:     Cervical: No cervical adenopathy.  Skin:    General: Skin is warm and dry.  Neurological:     Mental Status: She is alert.      ED Treatments / Results  Labs (all labs ordered are listed, but only abnormal results are displayed) Labs Reviewed  WET PREP, GENITAL - Abnormal; Notable for the following components:      Result Value   WBC, Wet Prep HPF POC MODERATE (*)  All other components within normal limits  CBC WITH DIFFERENTIAL/PLATELET - Abnormal; Notable for the following components:   RBC 3.54 (*)    Hemoglobin 9.3 (*)    HCT 30.7 (*)    All other components within normal limits  COMPREHENSIVE METABOLIC PANEL - Abnormal; Notable for the following components:   Sodium 133 (*)    CO2 21 (*)    Glucose, Bld 103 (*)    Calcium 8.6 (*)    Albumin 3.4 (*)    AST 13 (*)    All other components within normal limits  URINALYSIS, ROUTINE W REFLEX MICROSCOPIC - Abnormal; Notable for the following components:   Color, Urine RED (*)    APPearance TURBID (*)    Glucose, UA   (*)    Value: TEST NOT REPORTED DUE TO COLOR INTERFERENCE OF URINE PIGMENT   Hgb urine dipstick   (*)    Value: TEST NOT REPORTED DUE TO COLOR INTERFERENCE OF URINE PIGMENT   Bilirubin Urine   (*)    Value: TEST NOT REPORTED DUE TO COLOR INTERFERENCE OF URINE PIGMENT   Ketones, ur   (*)    Value: TEST NOT REPORTED DUE TO COLOR INTERFERENCE OF URINE PIGMENT   Protein, ur   (*)    Value: TEST NOT REPORTED DUE TO COLOR INTERFERENCE OF URINE PIGMENT   Nitrite   (*)    Value: TEST NOT REPORTED DUE TO COLOR INTERFERENCE OF URINE PIGMENT   Leukocytes,Ua   (*)    Value: TEST NOT REPORTED DUE TO COLOR INTERFERENCE OF URINE PIGMENT   All other components within normal limits  URINALYSIS, MICROSCOPIC (REFLEX) - Abnormal; Notable for the following components:   Bacteria, UA RARE (*)    All other components within normal limits  LIPASE, BLOOD  I-STAT BETA HCG BLOOD, ED ([facility], [facility], [facility] ONLY)   GC/CHLAMYDIA PROBE AMP ([place]) NOT AT [facility]    EKG None  Radiology Dg Chest Port 1 View  Result Date: 09/15/2018 CLINICAL DATA:  Cough and shortness of breath. EXAM: PORTABLE CHEST 1 VIEW COMPARISON:  09/11/2017 FINDINGS: The cardiomediastinal contours are normal. The lungs are clear. Pulmonary vasculature is normal. No consolidation, pleural effusion, or pneumothorax. No acute osseous abnormalities are seen. IMPRESSION: No acute chest findings. Electronically Signed   By: Keith Rake M.D.   On: 09/15/2018 23:19    Procedures Procedures (including critical care time)  Medications Ordered in ED Medications - No data to display   Initial Impression / Assessment and Plan / ED Course  I have reviewed the triage vital signs and the nursing notes.  Pertinent labs & imaging results that were available during my care of the patient were reviewed by me and considered in my medical decision making (see chart for details).    Pt presents with cough and SOB s/p traveling to [place] from 3/4-3/8. Did not appreciate coughing during extensive exam of patient. Satting 100% on RA. LCTAB. Hx of HIV. Last CD4 count per chart review 690 (04/19/18) and undetectable quant. Given immunocompromised state and symptoms will COVID screen with portable chest x ray. Do not feel pt meets admission criteria at this point; likely dispo home with self isolation precautions. Pt also having worsening vaginal bleeding with heavy clots x 4 days with lightheadedness and 1 episode of vomiting. + Urinary symptoms. Non tender to palpation, no CVA tenderness. Will get baseline labs including CBC, CMP, lipase, U/A with in and out cath, urine preg, and wet prep/GC chlamydia  probe. Pelvic exam to be performed. Will reevaluate once labs return. Rectal exam performed given oral temp of 99.1; rectal temp was 98.0; afebrile.   11:37 PM Pelvic exam performed; unable to visualize discharge given moderate amount of blood in  vaginal vault.  Pt urinated prior to exam and specimen was not collected; declined in and out cath. Discussed that I will be unable to obtain accurate results from urinalysis given she is on her period; she understands and still declines in and out cath. States "my urine is fine." Still need preg test; will get beta HCG.   12:48 AM CXR without acute abnormalities. No leukocytosis or lymphopenia. Pt satting 99% on RA. Low suspicion for COVID19 at this time. Hemoglobin found to be 9.3; last Hemoglobin 5 months ago at 11.8. Blood visualized in vaginal vault during pelvic exam but pt not hemorrhaging blood from os. Will get orthostatic vital signs. Still awaiting U/A at this time. Will dispo patient home with close follow up to [facility].   2:30 AM Orthostatic vital signs with no drop in BP or elevation in HR.   3:18 AM Urinalysis unable to be determined due to amount of blood in specimen. Will discharge patient home with close follow up with [facility] to evaluate vaginal bleeding. Advised patient that if she continues to bleed profusely she needs to go to the [facility] for further evaluation. Regarding cough; CXR negative and labs unremarkable. Do not suspect COVID at this time but given recent travel hx will have pt self isolate at home until she is symptom free for > 72 hours or for 14 days, whichever is longer. Discussed plan with pt who is in agreement with plan. Vital signs stable at this time and pt discharged home.   CHRISTLE NOLTING was evaluated in Emergency Department on 09/15/2018 for the symptoms described in the history of present illness. She was evaluated in the context of the global COVID-19 pandemic, which necessitated consideration that the patient might be at risk for infection with the SARS-CoV-2 virus that causes COVID-19. Institutional protocols and algorithms that pertain to the evaluation of patients at risk for COVID-19 are in a state of rapid change based on information released  by regulatory bodies including the CDC and federal and state organizations. These policies and algorithms were followed during the patient's care in the ED.       Final Clinical Impressions(s) / ED Diagnoses   Final diagnoses:  Vaginal bleeding  Cough    ED Discharge Orders    None       Eustaquio Maize, PA-C 09/16/18 0223    Jola Schmidt, MD 09/19/18 1204

## 2018-09-16 LAB — URINALYSIS, MICROSCOPIC (REFLEX): RBC / HPF: >50 RBC/hpf (ref 0–5)

## 2018-09-16 LAB — URINALYSIS, ROUTINE W REFLEX MICROSCOPIC

## 2018-09-16 LAB — I-STAT BETA HCG BLOOD, ED (MC, WL, AP ONLY): I-stat hCG, quantitative: <5 m[IU]/mL (ref <5)

## 2018-09-16 NOTE — ED Notes (Signed)
Patient verbalizes understanding of discharge instructions. Opportunity for questioning and answers were provided. Armband removed by staff, pt discharged from ED. Ambulated out to lobby  

## 2018-09-16 NOTE — Discharge Instructions (Addendum)
Please follow up with [facility] regarding vaginal bleeding. They have an open OBGYN clinic Monday-Wednesday with no appointments necessary. If you continue to have heavy bleeding, feel dizzy, or pass out please go to the [facility] attached to this hospital immediately for further evaluation.   You were also seen for a cough. Chest x ray was negative. Given recent travel history it is recommended that you self isolate until you are symptom free for > 72 hours or for 14 days; whichever one is longer.      Person Under Monitoring Name: Maria Nicholson  Location: [address] [place] [address]   Infection Prevention Recommendations for Individuals Confirmed to have, or Being Evaluated for, 2019 Novel Coronavirus (COVID-19) Infection Who Receive Care at Home  Individuals who are confirmed to have, or are being evaluated for, COVID-19 should follow the prevention steps below until a healthcare provider or local or state health department says they can return to normal activities.  Stay home except to get medical care You should restrict activities outside your home, except for getting medical care. Do not go to work, school, or public areas, and do not use public transportation or taxis.  Call ahead before visiting your doctor Before your medical appointment, call the healthcare provider and tell them that you have, or are being evaluated for, COVID-19 infection. This will help the healthcare providers office take steps to keep other people from getting infected. Ask your healthcare provider to call the local or state health department.  Monitor your symptoms Seek prompt medical attention if your illness is worsening (e.g., difficulty breathing). Before going to your medical appointment, call the healthcare provider and tell them that you have, or are being evaluated for, COVID-19 infection. Ask your healthcare provider to call the local or state health  department.  Wear a facemask You should wear a facemask that covers your nose and mouth when you are in the same room with other people and when you visit a healthcare provider. People who live with or visit you should also wear a facemask while they are in the same room with you.  Separate yourself from other people in your home As much as possible, you should stay in a different room from other people in your home. Also, you should use a separate bathroom, if available.  Avoid sharing household items You should not share dishes, drinking glasses, cups, eating utensils, towels, bedding, or other items with other people in your home. After using these items, you should wash them thoroughly with soap and water.  Cover your coughs and sneezes Cover your mouth and nose with a tissue when you cough or sneeze, or you can cough or sneeze into your sleeve. Throw used tissues in a lined trash can, and immediately wash your hands with soap and water for at least 20 seconds or use an alcohol-based hand rub.  Wash your [facility] your hands often and thoroughly with soap and water for at least 20 seconds. You can use an alcohol-based hand sanitizer if soap and water are not available and if your hands are not visibly dirty. Avoid touching your eyes, nose, and mouth with unwashed hands.   Prevention Steps for Caregivers and Household Members of Individuals Confirmed to have, or Being Evaluated for, COVID-19 Infection Being Cared for in the Home  If you live with, or provide care at home for, a person confirmed to have, or being evaluated for, COVID-19 infection please follow these  guidelines to prevent infection:  Follow healthcare providers instructions Make sure that you understand and can help the patient follow any healthcare provider instructions for all care.  Provide for the patients basic needs You should help the patient with basic needs in the home and provide support for getting  groceries, prescriptions, and other personal needs.  Monitor the patients symptoms If they are getting sicker, call his or her medical provider and tell them that the patient has, or is being evaluated for, COVID-19 infection. This will help the healthcare providers office take steps to keep other people from getting infected. Ask the healthcare provider to call the local or state health department.  Limit the number of people who have contact with the patient If possible, have only one caregiver for the patient. Other household members should stay in another home or place of residence. If this is not possible, they should stay in another room, or be separated from the patient as much as possible. Use a separate bathroom, if available. Restrict visitors who do not have an essential need to be in the home.  Keep older adults, very young children, and other sick people away from the patient Keep older adults, very young children, and those who have compromised immune systems or chronic health conditions away from the patient. This includes people with chronic heart, lung, or kidney conditions, diabetes, and cancer.  Ensure good ventilation Make sure that shared spaces in the home have good air flow, such as from an air conditioner or an opened window, weather permitting.  Wash your hands often Wash your hands often and thoroughly with soap and water for at least 20 seconds. You can use an alcohol based hand sanitizer if soap and water are not available and if your hands are not visibly dirty. Avoid touching your eyes, nose, and mouth with unwashed hands. Use disposable paper towels to dry your hands. If not available, use dedicated cloth towels and replace them when they become wet.  Wear a facemask and gloves Wear a disposable facemask at all times in the room and gloves when you touch or have contact with the patients blood, body fluids, and/or secretions or excretions, such as sweat,  saliva, sputum, nasal mucus, vomit, urine, or feces.  Ensure the mask fits over your nose and mouth tightly, and do not touch it during use. Throw out disposable facemasks and gloves after using them. Do not reuse. Wash your hands immediately after removing your facemask and gloves. If your personal clothing becomes contaminated, carefully remove clothing and launder. Wash your hands after handling contaminated clothing. Place all used disposable facemasks, gloves, and other waste in a lined container before disposing them with other household waste. Remove gloves and wash your hands immediately after handling these items.  Do not share dishes, glasses, or other household items with the patient Avoid sharing household items. You should not share dishes, drinking glasses, cups, eating utensils, towels, bedding, or other items with a patient who is confirmed to have, or being evaluated for, COVID-19 infection. After the person uses these items, you should wash them thoroughly with soap and water.  Wash laundry thoroughly Immediately remove and wash clothes or bedding that have blood, body fluids, and/or secretions or excretions, such as sweat, saliva, sputum, nasal mucus, vomit, urine, or feces, on them. Wear gloves when handling laundry from the patient. Read and follow directions on labels of laundry or clothing items and detergent. In general, wash and dry with the warmest temperatures  recommended on the label.  Clean all areas the individual has used often Clean all touchable surfaces, such as counters, tabletops, doorknobs, bathroom fixtures, toilets, phones, keyboards, tablets, and bedside tables, every day. Also, clean any surfaces that may have blood, body fluids, and/or secretions or excretions on them. Wear gloves when cleaning surfaces the patient has come in contact with. Use a diluted bleach solution (e.g., dilute bleach with 1 part bleach and 10 parts water) or a household disinfectant  with a label that says EPA-registered for coronaviruses. To make a bleach solution at home, add 1 tablespoon of bleach to 1 quart (4 cups) of water. For a larger supply, add  cup of bleach to 1 gallon (16 cups) of water. Read labels of cleaning products and follow recommendations provided on product labels. Labels contain instructions for safe and effective use of the cleaning product including precautions you should take when applying the product, such as wearing gloves or eye protection and making sure you have good ventilation during use of the product. Remove gloves and wash hands immediately after cleaning.  Monitor yourself for signs and symptoms of illness Caregivers and household members are considered close contacts, should monitor their health, and will be asked to limit movement outside of the home to the extent possible. Follow the monitoring steps for close contacts listed on the symptom monitoring form.   ? If you have additional questions, contact your local health department or call the epidemiologist on call at [phone] (available 24/7). ? This guidance is subject to change. For the most up-to-date guidance from [facility], please refer to their website: YouBlogs.pl

## 2018-09-17 LAB — GC/CHLAMYDIA PROBE AMP (~~LOC~~) NOT AT ARMC
Chlamydia: NEGATIVE
Neisseria Gonorrhea: NEGATIVE

## 2018-09-19 ENCOUNTER — Ambulatory Visit: Payer: Self-pay | Admitting: Infectious Diseases

## 2018-09-19 ENCOUNTER — Other Ambulatory Visit: Payer: Self-pay

## 2018-09-19 ENCOUNTER — Ambulatory Visit (INDEPENDENT_AMBULATORY_CARE_PROVIDER_SITE_OTHER): Payer: Self-pay | Admitting: Obstetrics and Gynecology

## 2018-09-19 ENCOUNTER — Telehealth: Payer: Self-pay | Admitting: *Deleted

## 2018-09-19 ENCOUNTER — Encounter: Payer: Self-pay | Admitting: Obstetrics and Gynecology

## 2018-09-19 ENCOUNTER — Telehealth: Payer: Self-pay | Admitting: Family Medicine

## 2018-09-19 DIAGNOSIS — B20 Human immunodeficiency virus [HIV] disease: Secondary | ICD-10-CM

## 2018-09-19 DIAGNOSIS — D219 Benign neoplasm of connective and other soft tissue, unspecified: Secondary | ICD-10-CM

## 2018-09-19 DIAGNOSIS — N939 Abnormal uterine and vaginal bleeding, unspecified: Secondary | ICD-10-CM

## 2018-09-19 DIAGNOSIS — Z124 Encounter for screening for malignant neoplasm of cervix: Secondary | ICD-10-CM

## 2018-09-19 MED ORDER — MEDROXYPROGESTERONE ACETATE 5 MG PO TABS: 5.0000 mg | ORAL_TABLET | Freq: Every day | ORAL | 3 refills | Status: DC

## 2018-09-19 MED ORDER — NORETHINDRONE ACETATE 5 MG PO TABS: 5.0000 mg | ORAL_TABLET | Freq: Every day | ORAL | 3 refills | Status: DC

## 2018-09-19 NOTE — Progress Notes (Signed)
I connected with  Maria Nicholson on 09/19/18 at  2:15 PM EDT by telephone and verified that I am speaking with the correct person using two identifiers.   I discussed the limitations, risks, security and privacy concerns of performing an evaluation and management service by telephone and the availability of in person appointments. I also discussed with the patient that there may be a patient responsible charge related to this service. The patient expressed understanding and agreed to proceed.  MERYEM HAERTEL, RN 09/19/2018  1:25 PM

## 2018-09-19 NOTE — Telephone Encounter (Signed)
Called the patient to ensure she knows she is schedule for a telephone visit. Stated yes.

## 2018-09-19 NOTE — Progress Notes (Addendum)
TELEHEALTH VIRTUAL GYNECOLOGY VISIT ENCOUNTER NOTE  I connected with Maria Nicholson on 09/19/18 at  2:15 PM EDT by telephone at home and verified that I am speaking with the correct person using two identifiers.   I discussed the limitations, risks, security and privacy concerns of performing an evaluation and management service by telephone and the availability of in person appointments. I also discussed with the patient that there may be a patient responsible charge related to this service. The patient expressed understanding and agreed to proceed.   History:  Maria Nicholson is a 33 y.o. G32P0020 female being evaluated today for heavy bleeding. She reports a history of fibroids, was diagnosed 04/2017 in [place]. Reports she had a miscarriage at that time, went to the hospital and they told her she had two fibroids. After that, bleeding went back to "normal" for her. Reports she has always had periods that are on the heavier side. She reports she is worried that her periods are too heavy, and has been told that her hemoglobin is too low.   Was on an OCP in [place] but her ID doctor took her off it because there was concern it would interact with her HIV meds. Has been off and on OCPs her whole life.   Normally, periods are monthly, lasting 5-7 days, goes through 12 pads per day on a normal day for her period. This month, her period came twice, which is unusual for her. Started 3/2-7 and then again 3/24-4/1. Reports periods overall are getting heavier.    Past Medical History:  Diagnosis Date  . Asthma   . Bronchitis   . HIV (human immunodeficiency virus infection) ([place])   . No pertinent past medical history    History reviewed. No pertinent surgical history. The following portions of the patient's history were reviewed and updated as appropriate: allergies, current medications, past family history, past medical history, past social history, past surgical history and problem list.    Health Maintenance:  Per patient, last pap 2018, was normal in [place].    Review of Systems:  Pertinent items noted in HPI and remainder of comprehensive ROS otherwise negative.  Physical Exam:  Physical exam deferred due to nature of the encounter  Labs and Imaging     Assessment and Plan:     1. Abnormal uterine bleeding (AUB) Heavier periods in recent time, bleeding much more heavily and she is concerned about her hemoglobin. Not currently on meds. Will start meds, not particularly wanting birth control as she hopes to have a child in the future. Recommended depo vs progestin vs IUD, she does not want depo. Will consider IUd, will start aygestin in the meantime, obtain TVUS (delayed due to [place]) and see her in office in 3 months for follow up and possible IUD placement, patient verbalizes understanding that Aygestin is NOT a contraceptive medication and agreeable to plan - US PELVIC COMPLETE WITH TRANSVAGINAL; Future -ADDENDUM: aygestin too expensive for patient, switched to provera 5 mg daily  2. HIV infection, unspecified symptom status ([place]) Cont current meds per ID  3. Fibroid See above  4. Pap smear for cervical cancer screening - needs pap smear      I discussed the assessment and treatment plan with the patient. The patient was provided an opportunity to ask questions and all were answered. The patient agreed with the plan and demonstrated an understanding of the instructions.   The patient was advised to call back or seek an in-person  evaluation/go to the ED if the symptoms worsen or if the condition fails to improve as anticipated.  I provided 23 minutes of non-face-to-face time during this encounter.   Sloan Leiter, MD [facility] [place]

## 2018-09-19 NOTE — Addendum Note (Signed)
Addended by: Vivien Rota on: 09/19/2018 02:37 PM   Modules accepted: Orders

## 2018-09-20 NOTE — Telephone Encounter (Signed)
Opened in error

## 2018-10-06 ENCOUNTER — Telehealth: Payer: Self-pay | Admitting: Physician Assistant

## 2018-10-06 DIAGNOSIS — Z20822 Contact with and (suspected) exposure to covid-19: Secondary | ICD-10-CM

## 2018-10-06 DIAGNOSIS — R6889 Other general symptoms and signs: Principal | ICD-10-CM

## 2018-10-06 DIAGNOSIS — B20 Human immunodeficiency virus [HIV] disease: Secondary | ICD-10-CM

## 2018-10-06 NOTE — Progress Notes (Signed)
  E-Visit for Corona Virus Screening  Based on what you have shared with me, you need to seek an evaluation for a severe illness that is causing your symptoms which may be coronavirus or some other illness. I recommend that you be seen and evaluated "face to face". Our Emergency Departments are best equipped to handle patients with severe symptoms.   I recommend the following:  . If you are having a true medical emergency please call 911. . If you are considered high risk for Corona virus because of a known exposure, fever, shortness of breath and cough, OR if you have severe symptoms of any kind, seek medical care at an emergency room.  . Please call ahead and tell them that you were seen by telemedicine and they have recommended that you have a face to face evaluation. . [facility] Emergency Department [address], [place], [place] [address] [phone]  . [facility] [place] Emergency Department [address], [place], [place] [address] [phone]  . [facility] Emergency Department [address], [place], [place] [address] [phone]  . [facility] Emergency Department [address], [place], [place] [address] [phone]  . [facility] Emergency Department [address], [place], [place] [address] [phone]  NOTE: If you entered your credit card information for this eVisit, you will not be charged. You may see a "hold" on your card for the $35 but that hold will drop off and you will not have a charge processed.   Your e-visit answers were reviewed by a board certified advanced clinical practitioner to complete your personal care plan.  Thank you for using e-Visits.  

## 2018-10-07 ENCOUNTER — Other Ambulatory Visit: Payer: Self-pay

## 2018-10-07 ENCOUNTER — Encounter (HOSPITAL_COMMUNITY): Payer: Self-pay | Admitting: Emergency Medicine

## 2018-10-07 ENCOUNTER — Emergency Department (HOSPITAL_COMMUNITY)
Admission: EM | Admit: 2018-10-07 | Discharge: 2018-10-07 | Disposition: A | Discharge status: Home / Self Care | Payer: Self-pay | Attending: Emergency Medicine | Admitting: Emergency Medicine

## 2018-10-07 DIAGNOSIS — J45909 Unspecified asthma, uncomplicated: Secondary | ICD-10-CM | POA: Insufficient documentation

## 2018-10-07 DIAGNOSIS — Z21 Asymptomatic human immunodeficiency virus [HIV] infection status: Secondary | ICD-10-CM | POA: Insufficient documentation

## 2018-10-07 DIAGNOSIS — F17210 Nicotine dependence, cigarettes, uncomplicated: Secondary | ICD-10-CM | POA: Insufficient documentation

## 2018-10-07 DIAGNOSIS — Z79899 Other long term (current) drug therapy: Secondary | ICD-10-CM | POA: Insufficient documentation

## 2018-10-07 DIAGNOSIS — R42 Dizziness and giddiness: Secondary | ICD-10-CM | POA: Insufficient documentation

## 2018-10-07 DIAGNOSIS — R110 Nausea: Secondary | ICD-10-CM

## 2018-10-07 LAB — BASIC METABOLIC PANEL
Anion gap: 9 (ref 5–15)
BUN: 13 mg/dL (ref 6–20)
CO2: 23 mmol/L (ref 22–32)
Calcium: 9.1 mg/dL (ref 8.9–10.3)
Chloride: 103 mmol/L (ref 98–111)
Creatinine, Ser: 0.88 mg/dL (ref 0.44–1.00)
GFR calc Af Amer: >60 mL/min (ref >60)
GFR calc non Af Amer: >60 mL/min (ref >60)
Glucose, Bld: 83 mg/dL (ref 70–99)
Potassium: 3.9 mmol/L (ref 3.5–5.1)
Sodium: 135 mmol/L (ref 135–145)

## 2018-10-07 LAB — CBC WITH DIFFERENTIAL/PLATELET
Abs Immature Granulocytes: 0.02 10*3/uL (ref 0.00–0.07)
Basophils Absolute: 0.1 10*3/uL (ref 0.0–0.1)
Basophils Relative: 1 %
Eosinophils Absolute: 0.3 10*3/uL (ref 0.0–0.5)
Eosinophils Relative: 3 %
HCT: 31.5 % — ABNORMAL LOW (ref 36.0–46.0)
Hemoglobin: 9.7 g/dL — ABNORMAL LOW (ref 12.0–15.0)
Immature Granulocytes: 0 %
Lymphocytes Relative: 30 %
Lymphs Abs: 2.9 10*3/uL (ref 0.7–4.0)
MCH: 26.3 pg (ref 26.0–34.0)
MCHC: 30.8 g/dL (ref 30.0–36.0)
MCV: 85.4 fL (ref 80.0–100.0)
Monocytes Absolute: 1.1 10*3/uL — ABNORMAL HIGH (ref 0.1–1.0)
Monocytes Relative: 12 %
Neutro Abs: 5.1 10*3/uL (ref 1.7–7.7)
Neutrophils Relative %: 54 %
Platelets: 388 10*3/uL (ref 150–400)
RBC: 3.69 MIL/uL — ABNORMAL LOW (ref 3.87–5.11)
RDW: 15.2 % (ref 11.5–15.5)
WBC: 9.5 10*3/uL (ref 4.0–10.5)
nRBC: 0 % (ref 0.0–0.2)

## 2018-10-07 LAB — URINALYSIS, ROUTINE W REFLEX MICROSCOPIC
Bilirubin Urine: NEGATIVE
Glucose, UA: NEGATIVE mg/dL
Hgb urine dipstick: NEGATIVE
Ketones, ur: NEGATIVE mg/dL
Leukocytes,Ua: NEGATIVE
Nitrite: NEGATIVE
Protein, ur: NEGATIVE mg/dL
Specific Gravity, Urine: 1.018 (ref 1.005–1.030)
pH: 7 (ref 5.0–8.0)

## 2018-10-07 LAB — I-STAT BETA HCG BLOOD, ED (MC, WL, AP ONLY): I-stat hCG, quantitative: <5 m[IU]/mL (ref <5)

## 2018-10-07 MED ORDER — FERROUS SULFATE 325 (65 FE) MG PO TABS: 325.0000 mg | ORAL_TABLET | Freq: Every day | ORAL | 0 refills | Status: DC

## 2018-10-07 MED ORDER — SODIUM CHLORIDE 0.9 % IV BOLUS
1000.0000 mL | Freq: Once | INTRAVENOUS | Status: AC
  Administered 2018-10-07: 02:00:00 1000 mL via INTRAVENOUS

## 2018-10-07 MED ORDER — ONDANSETRON 4 MG PO TBDP: 4.0000 mg | ORAL_TABLET | Freq: Three times a day (TID) | ORAL | 0 refills | Status: DC | PRN

## 2018-10-07 MED ORDER — ONDANSETRON HCL 4 MG/2ML IJ SOLN
4.0000 mg | Freq: Once | INTRAMUSCULAR | Status: AC
  Administered 2018-10-07: 4 mg via INTRAVENOUS
  Filled 2018-10-07: qty 2

## 2018-10-07 NOTE — ED Provider Notes (Signed)
[facility] EMERGENCY DEPARTMENT Provider Note   CSN: [id number] Arrival date & time: 10/07/18  0034    History   Chief Complaint Chief Complaint  Patient presents with  . Nausea  . Dizziness    HPI Maria Nicholson is a 33 y.o. female.     The history is provided by the patient and medical records.  Dizziness  Associated symptoms: nausea     33 y.o. F with hx of asthma, bronchitis, HIV (last CD4 count 690 on 04/19/18), presenting to the ED for lightheadedness and nausea.  States this started approx 3 days ago but worse today.  States she has lightheadedness with changing position, but mostly when she sits down instead of standing up.  States she has felt continuously nauseated, threw up today for the first time around 12am.  She ate dinner around 10:30 PM, tasted normal.  Has been able to drink fluids.  No fever, chills, sweats.  Has been urinating frequently but denies dysuria or hematuria.  No flank pain or abdominal pain.  States she has noticed she has been having a "cornstarch" taste in her mouth and eating ice a lot.  States she has had this happen before and told might be anemic.  She has not required blood transfusion. No meds taken PTA.     Patient also reports her menstrual cycle is a few days late.  States she actually had 2 cycles in March (one at beginning and one at end) but they were lighter than normal.  Has history of fibroids, no other GYN issues.  Unsure about pregnancy at this time.  Denies any cough, chest pain, SOB, recent sick contacts, known COVID exposures.  Past Medical History:  Diagnosis Date  . Asthma   . Bronchitis   . HIV (human immunodeficiency virus infection) ([place])   . No pertinent past medical history     Patient Active Problem List   Diagnosis Date Noted  . HIV disease ([place]) 11/06/2017  . Eczema 11/17/2011    History reviewed. No pertinent surgical history.   OB History    Gravida  2   Para  0   Term      Preterm      AB  2   Living  0     SAB  1   TAB  1   Ectopic      Multiple      Live Births               Home Medications    Prior to Admission medications   Medication Sig Start Date End Date Taking? Authorizing Provider  abacavir-lamiVUDine (EPZICOM) 600-300 MG tablet Take 1 tablet by mouth daily. Patient taking differently: Take 1 tablet by mouth at bedtime.  10/17/17   [facility] Callas, NP  acetaminophen (TYLENOL) 500 MG tablet Take 500 mg by mouth every 6 (six) hours as needed for headache (pain).    [provider]  albuterol (PROVENTIL HFA;VENTOLIN HFA) 108 (90 Base) MCG/ACT inhaler Inhale into the lungs every 6 (six) hours as needed for wheezing or shortness of breath (seasonal allergies).    [provider]  darunavir-cobicistat (PREZCOBIX) 800-150 MG tablet Take 1 tablet by mouth daily with breakfast. Swallow whole. Do NOT crush, break or chew tablets. Take with food. Patient taking differently: Take 1 tablet by mouth at bedtime. Swallow whole. Do NOT crush, break or chew tablets. Take with food.  10/17/17   [place] Callas, NP  medroxyPROGESTERone (PROVERA) 5 MG tablet Take 1 tablet (5 mg total) by mouth daily. 09/19/18   Sloan Leiter, MD  ondansetron (ZOFRAN) 4 MG tablet Take 1 tablet (4 mg total) by mouth every 8 (eight) hours as needed for nausea or vomiting. Patient not taking: Reported on 09/19/2018 08/24/17   Rolland Porter, MD    Family History Family History  Problem Relation Age of Onset  . Cancer Maternal Grandmother        throat  . Cancer Father 48       spinal   . Cancer Maternal Aunt 62       throat    Social History Social History   Tobacco Use  . Smoking status: Current Some Day Smoker    Packs/day: 0.50    Years: 2.00    Pack years: 1.00    Types: Cigarettes  . Smokeless tobacco: Never Used  . Tobacco comment: decreased from one pack daily     Substance Use Topics  . Alcohol use: Yes    Comment: Occasionally   .  Drug use: Not Currently    Types: Marijuana     Allergies   Amoxicillin and Coconut flavor   Review of Systems Review of Systems  Gastrointestinal: Positive for nausea.  Neurological: Positive for dizziness.  All other systems reviewed and are negative.    Physical Exam Updated Vital Signs BP 114/70 (BP Location: Right Arm)   Pulse 96   Temp 98.7 F (37.1 C) (Oral)   Resp 16   Ht 5\' 7"  (1.702 m)   Wt 105.2 kg   LMP 09/11/2018   SpO2 100%   BMI 36.34 kg/m   Physical Exam Vitals signs and nursing note reviewed.  Constitutional:      Appearance: She is well-developed.  HENT:     Head: Normocephalic and atraumatic.     Mouth/Throat:     Comments: Moist mucous membranes Eyes:     Conjunctiva/sclera: Conjunctivae normal.     Pupils: Pupils are equal, round, and reactive to light.  Neck:     Musculoskeletal: Normal range of motion.  Cardiovascular:     Rate and Rhythm: Normal rate and regular rhythm.     Heart sounds: Normal heart sounds.  Pulmonary:     Effort: Pulmonary effort is normal.     Breath sounds: Normal breath sounds.  Abdominal:     General: Bowel sounds are normal.     Palpations: Abdomen is soft.  Musculoskeletal: Normal range of motion.  Skin:    General: Skin is warm and dry.  Neurological:     Mental Status: She is alert and oriented to person, place, and time.     Comments: AAOx3, answering questions and following commands appropriately; equal strength UE and LE bilaterally; CN grossly intact; moves all extremities appropriately without ataxia; no focal neuro deficits or facial asymmetry appreciated, ambulatory with steady gait      ED Treatments / Results  Labs (all labs ordered are listed, but only abnormal results are displayed) Labs Reviewed  CBC WITH DIFFERENTIAL/PLATELET - Abnormal; Notable for the following components:      Result Value   RBC 3.69 (*)    Hemoglobin 9.7 (*)    HCT 31.5 (*)    Monocytes Absolute 1.1 (*)    All  other components within normal limits  URINALYSIS, ROUTINE W REFLEX MICROSCOPIC  BASIC METABOLIC PANEL  I-STAT BETA HCG BLOOD, ED ([facility], [facility], [facility] ONLY)    EKG None  Radiology No results found.  Procedures Procedures (including critical care time)  Medications Ordered in ED Medications - No data to display   Initial Impression / Assessment and Plan / ED Course  I have reviewed the triage vital signs and the nursing notes.  Pertinent labs & imaging results that were available during my care of the patient were reviewed by me and considered in my medical decision making (see chart for details).  33 year old female presenting to the ED with lightheadedness and nausea for the past 3 days.  States her menstrual cycle is also a few days late, but did have 2 cycles last month.  Has hx of uterine fibroids.  She is afebrile and nontoxic in appearance.  She is hemodynamically stable, neurologic exam is nonfocal.  Abdomen is soft and benign.  Mucous membranes are moist and she does not appear clinically dehydrated.  States she has been craving ice a lot recently and has a "cornstarch" taste in her mouth.  She has not been eating any abnormal substances such as chalk, pencils, etc. to suggest true PICA.  Plan for screening labs, preg tests.  Given IVF, zofran.  Patient's labs are overall reassuring.  Pregnancy test is negative.  She is slightly anemic with a hemoglobin of 9.7.  Based on prior values it does seem that she has been trending down over the past few years.  Question if this is possibly related to heavy menses/fibroids.  No noted electrolyte imbalance.  She is continued getting up and walking around the ER without difficulty.  No near syncopal events.  She is tolerating Sprite after Zofran.  Feel she is stable for discharge home.  Will start her on iron supplementation, continue Zofran as needed she was informed that iron supplements may make her stools turn dark/black in color, may be better to  take with food.  She will need close follow-up with her PCP.  She can return here for any acute changes.  Final Clinical Impressions(s) / ED Diagnoses   Final diagnoses:  Lightheadedness  Nausea    ED Discharge Orders         Ordered    ondansetron (ZOFRAN ODT) 4 MG disintegrating tablet  Every 8 hours PRN     10/07/18 0231    ferrous sulfate 325 (65 FE) MG tablet  Daily     10/07/18 0231           Larene Pickett, PA-C 10/07/18 0408    Palumbo, April, MD 10/07/18 3300

## 2018-10-07 NOTE — ED Triage Notes (Signed)
Reports having nausea for the last three days with vomiting X 3 and feeling dizzy.  Also reports her period is a few days late.

## 2018-10-07 NOTE — ED Notes (Signed)
Reviewed discharge instructions with patient.  Verbalized understanding.  To leave once bolus is complete.

## 2018-10-07 NOTE — Discharge Instructions (Signed)
Take the prescribed medication as directed.  Make sure to continue drinking fluid regularly.  The iron will likely turn your stool black which is normal.  It can be a little harsh on the stomach so I would take with food. Follow-up with your primary care doctor. Return to the ED for new or worsening symptoms.

## 2018-10-09 ENCOUNTER — Other Ambulatory Visit: Payer: Self-pay

## 2018-10-09 DIAGNOSIS — B20 Human immunodeficiency virus [HIV] disease: Secondary | ICD-10-CM

## 2018-10-09 MED ORDER — DARUNAVIR-COBICISTAT 800-150 MG PO TABS: 1.0000 | ORAL_TABLET | Freq: Every day | ORAL | 5 refills | Status: DC

## 2018-10-09 MED ORDER — ABACAVIR SULFATE-LAMIVUDINE 600-300 MG PO TABS: 1.0000 | ORAL_TABLET | Freq: Every day | ORAL | 5 refills | Status: DC

## 2018-10-09 NOTE — Addendum Note (Signed)
Addended by: Eugenia Mcalpine on: 10/09/2018 03:39 PM   Modules accepted: Orders

## 2018-10-16 ENCOUNTER — Telehealth: Payer: Self-pay | Admitting: Family

## 2018-10-16 NOTE — Telephone Encounter (Signed)
[id number] Pre-Screening Questions: 10/16/18  Do you currently have a fever (>100 F), chills or unexplained body aches? NO  Are you currently experiencing new cough, shortness of breath, sore throat, runny nose? NO   Have you recently travelled outside the state of [place] in the last 14 days? NO    Have you been in contact with someone that is currently pending confirmation of Covid19 testing or has been confirmed to have the [place] virus?  NO   **If the patient answers NO to ALL questions -  advise the patient to please call the clinic before coming to the office should any symptoms develop.

## 2018-10-17 ENCOUNTER — Other Ambulatory Visit: Payer: Self-pay

## 2018-10-17 ENCOUNTER — Ambulatory Visit (INDEPENDENT_AMBULATORY_CARE_PROVIDER_SITE_OTHER): Payer: Self-pay | Admitting: Infectious Diseases

## 2018-10-17 ENCOUNTER — Encounter: Payer: Self-pay | Admitting: Infectious Diseases

## 2018-10-17 VITALS — BP 107/71 | HR 97 | Temp 99.1°F | Wt 251.0 lb

## 2018-10-17 DIAGNOSIS — B20 Human immunodeficiency virus [HIV] disease: Secondary | ICD-10-CM

## 2018-10-17 DIAGNOSIS — Z124 Encounter for screening for malignant neoplasm of cervix: Secondary | ICD-10-CM

## 2018-10-17 NOTE — Progress Notes (Signed)
Subjective:    Maria Nicholson is a 33 y.o. female here for an annual pelvic exam and pap smear.   Review of Systems: Current GYN complaints or concerns: Frequent uterine bleeding.  She is having her menstrual cycle every 15 days.  She was seen in the emergency department for this and has been to the gynecology office for evaluation.  She was given Provera which she is taking currently.  She reports this to have helped and improve the flow of her cycle.  No longer having clots.  However now her cycles are lasting longer.  She would like to have a fertility preserving option to treat this. Patient denies any abdominal/pelvic pain, problems with bowel movements, urination, vaginal discharge or intercourse.   Past Medical History:  Diagnosis Date  . Asthma   . Bronchitis   . HIV (human immunodeficiency virus infection) ([place])   . No pertinent past medical history     Gynecologic History: G2P0020  Patient's last menstrual period was 10/10/2018 (exact date). Contraception: condoms Last Pap: not on file.  Anal Intercourse: No Last Mammogram: Never.   Objective:  Physical Exam  Constitutional: Well developed, well nourished, no acute distress. She is alert and oriented x3.  Pelvic: External genitalia is normal in appearance. The vagina is normal in appearance. The cervix is bulbous and easily visualized. No CMT, normal expected cervical mucus present.  Bimanual exam deferred as this was recently done by GYN team  Breasts: symmetrical in contour, shape and texture. No palpable masses/nodules. No nipple discharge.  Psych: She has a normal mood and affect.    Assessment:  Cervical cancer screening  = normal pelvic exam.  She does have a small amount of blood in the vaginal vault.  No clots observed.  No abnormalities of the cervix noted. Thin prep pap was obtained and sent for cytology with reflex HPV and GC/C today. Normal clinical breast exam.   Abnormal uterine bleeding = In care  with gynecology team.  She has a history of fibroids dating back to 2017.  Currently awaiting ultrasound appointment in June.  Anemia = related to chronic blood loss in the setting of abnormal uterine uterine bleeding.  She is currently taking her iron once a day.  She is not symptomatic.   Plan:  Health Maintenance =   Wet prep obtained and sent for cytology/HPV and gonorrhea, chlamydia screen.   Return in 1 year for annual pap screening unless indicated sooner. Discussed recommended screening interval for women living with HIV disease. Will continue annual screenings for now with consideration to increase interval to q72yr pending further discussions  Results will be communicated to the patient via MyChart  She has been counseled and instructed how to perform monthly self breast exams.  Screening mammogram not presently indicated for age.   Contraception / Family Planning =   Considering IUD for contraception and Fibroid related bleeding  Abnormal Uterine Bleeding=   She is having a good response to Provera with lightening of flow, no clots.  She still has a period about every 15 days.  We discussed consideration for Mirena IUD.  She has follow-up appointment in June.  Anemia=  Continue iron supplementation.  Advised to take with vitamin C food or vitamin C supplement.  Requested to separate from her HIV medications.  HIV =   She will continue her Epzicom and Prezcobix and F/U as scheduled with Terri Piedra for ongoing HIV care.   Janene Madeira, MSN,  NP-C [facility] [place] Office: [phone] Pager: [phone]  10/17/18 3:14 PM

## 2018-10-18 LAB — T-HELPER CELL (CD4) - (RCID CLINIC ONLY)
CD4 % Helper T Cell: 38 % (ref 33–65)
CD4 T Cell Abs: 719 /uL (ref 400–1790)

## 2018-10-19 LAB — CYTOLOGY - PAP
Adequacy: ABSENT
Chlamydia: NEGATIVE
Diagnosis: NEGATIVE
HPV: NOT DETECTED
Neisseria Gonorrhea: NEGATIVE

## 2018-10-23 LAB — HIV-1 RNA QUANT-NO REFLEX-BLD
HIV 1 RNA Quant: <20 copies/mL — AB
HIV-1 RNA Quant, Log: <1.3 Log copies/mL — AB

## 2018-11-11 ENCOUNTER — Telehealth: Payer: Self-pay | Admitting: Family

## 2018-11-11 DIAGNOSIS — R102 Pelvic and perineal pain: Secondary | ICD-10-CM

## 2018-11-11 NOTE — Progress Notes (Signed)
Based on what you shared with me, I feel your condition warrants further evaluation and I recommend that you be seen for a face to face office visit.     NOTE: If you entered your credit card information for this eVisit, you will not be charged. You may see a "hold" on your card for the $35 but that hold will drop off and you will not have a charge processed.  If you are having a true medical emergency please call 911.  If you need an urgent face to face visit, [facility] has four urgent care centers for your convenience.    PLEASE NOTE: THE [facility] LOCATIONS AND URGENT CARE CLINICS DO NOT HAVE THE TESTING FOR CORONAVIRUS COVID19 AVAILABLE.  IF YOU FEEL YOU NEED THIS TEST YOU MUST GO TO A TRIAGE LOCATION AT [facility]   [email or web address] to reserve your spot online an avoid wait times  [facility] [address] [place], [place] [address] Modified hours of operation: Monday-Friday, 12 PM to 6 PM  Saturday & Sunday 10 AM to 4 PM *Across the street from [place] (New Address!) [address] [place], [place] [address] *Just off [address], across the road from [place] hours of operation: Monday-Friday, 12 PM to 6 PM  Closed Saturday & Sunday  [facility]'s modified hours of operation will be in effect from May 1 until May 31   The following sites will take your insurance:  . [facility] a Provider at this Location  [address] [place], [place] [address] . 10 am to 8 pm Monday-Friday . 12 pm to 8 pm Saturday-Sunday   . [facility] Urgent [facility] at [place] a Provider at this Location  [place], [place], [place] [address] . 8 am to 8 pm Monday-Friday . 9 am to 6 pm Saturday . 11 am to 6 pm Sunday   . [facility] Urgent Care at [place] Get Driving Directions  [address] [place],  [address] . 8 am to 8 pm Monday-Friday . 8 am to 4 pm Saturday-Sunday   Your e-visit answers were reviewed by a board certified advanced clinical practitioner to complete your personal care plan.  Thank you for using e-Visits.  Greater than 5 minutes, yet less than 10 minutes of time have been spent researching, coordinating, and implementing care for this patient today.  Thank you for the details you included in the comment boxes. Those details are very helpful in determining the best course of treatment for you and help [place] to provide the best care.

## 2018-11-12 ENCOUNTER — Other Ambulatory Visit: Payer: Self-pay

## 2018-11-12 ENCOUNTER — Encounter (HOSPITAL_COMMUNITY): Payer: Self-pay | Admitting: Emergency Medicine

## 2018-11-12 ENCOUNTER — Emergency Department (HOSPITAL_COMMUNITY)
Admission: EM | Admit: 2018-11-12 | Discharge: 2018-11-12 | Disposition: A | Discharge status: Home / Self Care | Payer: Self-pay | Attending: Emergency Medicine | Admitting: Emergency Medicine

## 2018-11-12 DIAGNOSIS — F17210 Nicotine dependence, cigarettes, uncomplicated: Secondary | ICD-10-CM | POA: Insufficient documentation

## 2018-11-12 DIAGNOSIS — J45909 Unspecified asthma, uncomplicated: Secondary | ICD-10-CM | POA: Insufficient documentation

## 2018-11-12 DIAGNOSIS — Z21 Asymptomatic human immunodeficiency virus [HIV] infection status: Secondary | ICD-10-CM | POA: Insufficient documentation

## 2018-11-12 DIAGNOSIS — Z79899 Other long term (current) drug therapy: Secondary | ICD-10-CM | POA: Insufficient documentation

## 2018-11-12 DIAGNOSIS — N939 Abnormal uterine and vaginal bleeding, unspecified: Secondary | ICD-10-CM | POA: Insufficient documentation

## 2018-11-12 LAB — COMPREHENSIVE METABOLIC PANEL
ALT: 15 U/L (ref 0–44)
AST: 18 U/L (ref 15–41)
Albumin: 3.8 g/dL (ref 3.5–5.0)
Alkaline Phosphatase: 60 U/L (ref 38–126)
Anion gap: 9 (ref 5–15)
BUN: 9 mg/dL (ref 6–20)
CO2: 24 mmol/L (ref 22–32)
Calcium: 9.3 mg/dL (ref 8.9–10.3)
Chloride: 104 mmol/L (ref 98–111)
Creatinine, Ser: 0.94 mg/dL (ref 0.44–1.00)
GFR calc Af Amer: >60 mL/min (ref >60)
GFR calc non Af Amer: >60 mL/min (ref >60)
Glucose, Bld: 93 mg/dL (ref 70–99)
Potassium: 3.8 mmol/L (ref 3.5–5.1)
Sodium: 137 mmol/L (ref 135–145)
Total Bilirubin: 0.4 mg/dL (ref 0.3–1.2)
Total Protein: 7.1 g/dL (ref 6.5–8.1)

## 2018-11-12 LAB — URINALYSIS, ROUTINE W REFLEX MICROSCOPIC
Bilirubin Urine: NEGATIVE
Glucose, UA: NEGATIVE mg/dL
Ketones, ur: NEGATIVE mg/dL
Leukocytes,Ua: NEGATIVE
Nitrite: NEGATIVE
Protein, ur: NEGATIVE mg/dL
RBC / HPF: >50 RBC/hpf — ABNORMAL HIGH (ref 0–5)
Specific Gravity, Urine: 1.006 (ref 1.005–1.030)
pH: 7 (ref 5.0–8.0)

## 2018-11-12 LAB — PREGNANCY, URINE: Preg Test, Ur: NEGATIVE

## 2018-11-12 LAB — CBC
HCT: 35.2 % — ABNORMAL LOW (ref 36.0–46.0)
Hemoglobin: 11.4 g/dL — ABNORMAL LOW (ref 12.0–15.0)
MCH: 28.6 pg (ref 26.0–34.0)
MCHC: 32.4 g/dL (ref 30.0–36.0)
MCV: 88.2 fL (ref 80.0–100.0)
Platelets: 336 10*3/uL (ref 150–400)
RBC: 3.99 MIL/uL (ref 3.87–5.11)
RDW: 19.1 % — ABNORMAL HIGH (ref 11.5–15.5)
WBC: 5.7 10*3/uL (ref 4.0–10.5)
nRBC: 0 % (ref 0.0–0.2)

## 2018-11-12 LAB — LIPASE, BLOOD: Lipase: 26 U/L (ref 11–51)

## 2018-11-12 MED ORDER — SODIUM CHLORIDE 0.9% FLUSH: 3.0000 mL | Freq: Once | INTRAVENOUS | Status: DC

## 2018-11-12 NOTE — Discharge Instructions (Signed)
It is very important that you follow-up with OB/GYN for further evaluation of your vaginal bleeding. Make sure you are staying well-hydrated water. Continue taking your iron pills as prescribed. Return to the emergency room with any new, worsening, concerning symptoms.

## 2018-11-12 NOTE — ED Provider Notes (Signed)
[facility] DEPARTMENT Provider Note   CSN: [id number] Arrival date & time: 11/12/18  1437    History   Chief Complaint Chief Complaint  Patient presents with  . Vaginal Bleeding    HPI Maria Nicholson is a 33 y.o. female presenting for evaluation of vaginal bleeding.  Patient states she has been having intermittent vaginal bleeding for the past several months.  She states she is bleeding about every other week.  She used to have very heavy periods for 7 days, and she was started on provera to decrease bleeding.  However, since then she has had lighter bleeding but it is more frequent.  Patient states her current bleeding has been going on for 10 days.  She states today she felt off, which sometimes means her blood pressure is low.  Denies overt dizziness or lightheadedness.  Symptoms are not worse upon standing.  Patient states she contacted OB/GYN on Friday to set up an appointment, but has not heard back.  She denies fevers, chills, nausea, vomiting, abdominal pain, urinary symptoms, abnormal bowel movements.  She denies vaginal discharge.  She is sexually active with one female partner, does not use protection.  Patient reports she has been told that she has fibroids, last ultrasound was 2 years ago.  She has not seen an OB/GYN for a year and a half.  She has a history of HIV for which she takes medication, viral loads are undetectable.  She has no other medical problems.     HPI  Past Medical History:  Diagnosis Date  . Asthma   . Bronchitis   . HIV (human immunodeficiency virus infection) ([place])   . No pertinent past medical history     Patient Active Problem List   Diagnosis Date Noted  . HIV disease ([place]) 11/06/2017  . Eczema 11/17/2011    History reviewed. No pertinent surgical history.   OB History    Gravida  2   Para  0   Term      Preterm      AB  2   Living  0     SAB  1   TAB  1   Ectopic      Multiple      Live  Births               Home Medications    Prior to Admission medications   Medication Sig Start Date End Date Taking? Authorizing Provider  abacavir-lamiVUDine (EPZICOM) 600-300 MG tablet Take 1 tablet by mouth daily. 10/09/18  Yes Golden Circle, FNP  darunavir-cobicistat (PREZCOBIX) 800-150 MG tablet Take 1 tablet by mouth daily with breakfast. Swallow whole. Do NOT crush, break or chew tablets. Take with food. 10/09/18  Yes Golden Circle, FNP  ferrous sulfate 325 (65 FE) MG tablet Take 1 tablet (325 mg total) by mouth daily. 10/07/18  Yes Larene Pickett, PA-C  medroxyPROGESTERone (PROVERA) 5 MG tablet Take 1 tablet (5 mg total) by mouth daily. 09/19/18  Yes Sloan Leiter, MD  ondansetron (ZOFRAN ODT) 4 MG disintegrating tablet Take 1 tablet (4 mg total) by mouth every 8 (eight) hours as needed for nausea. 10/07/18  Yes Larene Pickett, PA-C    Family History Family History  Problem Relation Age of Onset  . Cancer Maternal Grandmother        throat  . Cancer Father 36       spinal   . Cancer Maternal Aunt 8  throat    Social History Social History   Tobacco Use  . Smoking status: Current Some Day Smoker    Packs/day: 0.50    Years: 2.00    Pack years: 1.00    Types: Cigarettes  . Smokeless tobacco: Never Used  . Tobacco comment: decreased from one pack daily     Substance Use Topics  . Alcohol use: Yes    Comment: Occasionally   . Drug use: Not Currently    Types: Marijuana     Allergies   Amoxicillin and Coconut flavor   Review of Systems Review of Systems  Genitourinary: Positive for vaginal bleeding.  All other systems reviewed and are negative.    Physical Exam Updated Vital Signs BP 111/77 (BP Location: Right Arm)   Pulse 88   Temp 98.6 F (37 C) (Oral)   Resp 18   Ht 5\' 7"  (1.702 m)   Wt 108.9 kg   SpO2 100%   BMI 37.59 kg/m   Physical Exam Vitals signs and nursing note reviewed. Exam conducted with a chaperone present.   Constitutional:      General: [name]    Appearance: She is well-developed.     Comments: Resting comfortably in the bed in no acute distress  HENT:     Head: Normocephalic and atraumatic.  Eyes:     Conjunctiva/sclera: Conjunctivae normal.     Pupils: Pupils are equal, round, and reactive to light.  Neck:     Musculoskeletal: Normal range of motion and neck supple.  Cardiovascular:     Rate and Rhythm: Normal rate and regular rhythm.  Pulmonary:     Effort: Pulmonary effort is normal. No respiratory distress.     Breath sounds: Normal breath sounds. No wheezing.  Abdominal:     General: There is no distension.     Palpations: Abdomen is soft. There is no mass.     Tenderness: There is no abdominal tenderness. There is no guarding or rebound.  Genitourinary:    Exam position: Supine.     Cervix: Cervical bleeding present. No cervical motion tenderness.     Uterus: Not tender.      Adnexa:        Right: No tenderness.         Left: No tenderness.       Comments: Moderate blood in the vaginal canal.  No clots.  No CMT or adnexal tenderness. Musculoskeletal: Normal range of motion.  Skin:    General: Skin is warm and dry.     Capillary Refill: Capillary refill takes less than 2 seconds.  Neurological:     Mental Status: She is alert and oriented to person, place, and time.      ED Treatments / Results  Labs (all labs ordered are listed, but only abnormal results are displayed) Labs Reviewed  CBC - Abnormal; Notable for the following components:      Result Value   Hemoglobin 11.4 (*)    HCT 35.2 (*)    RDW 19.1 (*)    All other components within normal limits  URINALYSIS, ROUTINE W REFLEX MICROSCOPIC - Abnormal; Notable for the following components:   Color, Urine AMBER (*)    Hgb urine dipstick LARGE (*)    RBC / HPF >50 (*)    Bacteria, UA RARE (*)    All other components within normal limits  LIPASE, BLOOD  COMPREHENSIVE METABOLIC PANEL   PREGNANCY, URINE  I-STAT BETA HCG  BLOOD, ED ([facility], [facility], [facility] ONLY)    EKG None  Radiology No results found.  Procedures Procedures (including critical care time)  Medications Ordered in ED Medications  sodium chloride flush (NS) 0.9 % injection 3 mL (has no administration in time range)     Initial Impression / Assessment and Plan / ED Course  I have reviewed the triage vital signs and the nursing notes.  Pertinent labs & imaging results that were available during my care of the patient were reviewed by me and considered in my medical decision making (see chart for details).        Pt presenting for evaluation of vaginal bleeding.  Physical exam reassuring, she appears nontoxic.  No abdominal tenderness.  Pelvic shows moderate blood without clots.  Likely consistent with period bleeding.  Will order labs to assess hemoglobin.  Orthostatics ordered.  Pregnancy ordered.  Orthostatics negative.  Hemoglobin reassuring.  Pregnancy negative.  She is without abdominal pain, I do not believe ultrasound would be beneficial today.  Doubt infection, ectopic, abortion, torsion, PID, toa.   Discussed with patient importance of follow-up with OB/gyn.  Information given for the outpatient clinic.  At this time, patient appears safe for discharge.  Return precautions given.  Patient states she understands and agrees to plan.  Final Clinical Impressions(s) / ED Diagnoses   Final diagnoses:  Vaginal bleeding    ED Discharge Orders    None       Franchot Heidelberg, PA-C 11/12/18 2117    Deno Etienne, DO 11/12/18 2235

## 2018-11-12 NOTE — ED Triage Notes (Signed)
Pt started new medication for vaginal bleeding 6 weeks ago, with no relief. States she feels light headed today. Pt has hx of fibroids with bleeding. Pt tearful during triage.

## 2018-11-12 NOTE — ED Notes (Signed)
Patient verbalizes understanding of discharge instructions . Opportunity for questions and answers were provided . Armband removed by staff ,Pt discharged from ED. W/C  offered at D/C  and Declined W/C at D/C and was escorted to lobby by RN.  

## 2018-11-13 ENCOUNTER — Telehealth: Payer: Self-pay | Admitting: Student

## 2018-11-13 NOTE — Telephone Encounter (Signed)
The patient stated she has been bleeding since the beginning of May. Experiencing clotting. Changes pads about 3x a day. She stated she would like to speak with a nurse about the issue for options of what she can do until her appointment.

## 2018-11-14 ENCOUNTER — Telehealth: Payer: Self-pay

## 2018-11-14 ENCOUNTER — Other Ambulatory Visit: Payer: Self-pay | Admitting: Obstetrics and Gynecology

## 2018-11-14 DIAGNOSIS — N939 Abnormal uterine and vaginal bleeding, unspecified: Secondary | ICD-10-CM

## 2018-11-14 DIAGNOSIS — R102 Pelvic and perineal pain unspecified side: Secondary | ICD-10-CM

## 2018-11-14 MED ORDER — MEGESTROL ACETATE 20 MG PO TABS: 20.0000 mg | ORAL_TABLET | Freq: Two times a day (BID) | ORAL | 1 refills | Status: DC

## 2018-11-14 MED ORDER — TRAMADOL HCL 50 MG PO TABS: 50.0000 mg | ORAL_TABLET | Freq: Four times a day (QID) | ORAL | 0 refills | Status: DC | PRN

## 2018-11-14 NOTE — Telephone Encounter (Signed)
Attempted to call Pt. No answer, LM for pt to call the office back at her earliest convenience.

## 2018-11-14 NOTE — Telephone Encounter (Signed)
Pt LVM because she was bleeding went to the hospital been bleeding since may 1st. Provera is not helping. Still going through 3 pads a day. Went to the ED 4 times and still no resolution. Cramping scale of 10. Taking tylenol and its not helping.  Per Dr.Ervin to send patient Megace 20mg  BID and He will send Tramadol in to pharmacy also will schedule patient a transvaginal ultrasound. 11/26/2018 will get a follow up gyn visit scheduled.

## 2018-11-15 ENCOUNTER — Telehealth: Payer: Self-pay | Admitting: Obstetrics and Gynecology

## 2018-11-15 DIAGNOSIS — N939 Abnormal uterine and vaginal bleeding, unspecified: Secondary | ICD-10-CM

## 2018-11-15 MED ORDER — MEGESTROL ACETATE 20 MG PO TABS: 20.0000 mg | ORAL_TABLET | Freq: Two times a day (BID) | ORAL | 1 refills | Status: DC

## 2018-11-15 NOTE — Telephone Encounter (Signed)
Patient is requesting a change in the medicine she was given on yesterday.

## 2018-11-16 NOTE — Telephone Encounter (Signed)
Called and spoke with Pt and she reports she has already been spoken to. Pt with no further questions/concerns at this time.

## 2018-11-26 ENCOUNTER — Ambulatory Visit (HOSPITAL_COMMUNITY)
Admission: RE | Admit: 2018-11-26 | Discharge: 2018-11-26 | Disposition: A | Discharge status: Home / Self Care | Payer: Self-pay | Source: Ambulatory Visit | Attending: Obstetrics and Gynecology | Admitting: Obstetrics and Gynecology

## 2018-11-26 ENCOUNTER — Telehealth: Payer: Self-pay | Admitting: Family

## 2018-11-26 ENCOUNTER — Other Ambulatory Visit: Payer: Self-pay

## 2018-11-26 DIAGNOSIS — N939 Abnormal uterine and vaginal bleeding, unspecified: Secondary | ICD-10-CM | POA: Insufficient documentation

## 2018-11-26 NOTE — Telephone Encounter (Signed)
COVID-19 Pre-Screening Questions:11/26/18   Do you currently have a fever (>100 F), chills or unexplained body aches? NO  Are you currently experiencing new cough, shortness of breath, sore throat, runny nose? NO   Have you recently travelled outside the state of [place] in the last 14 days?NO   Have you been in contact with someone that is currently pending confirmation of Covid19 testing or has been confirmed to have the [place] virus?  NO  **If the patient answers NO to ALL questions -  advise the patient to please call the clinic before coming to the office should any symptoms develop.

## 2018-11-27 ENCOUNTER — Telehealth: Payer: Self-pay | Admitting: Family Medicine

## 2018-11-27 ENCOUNTER — Ambulatory Visit (INDEPENDENT_AMBULATORY_CARE_PROVIDER_SITE_OTHER): Payer: Self-pay | Admitting: Family

## 2018-11-27 ENCOUNTER — Encounter: Payer: Self-pay | Admitting: Family

## 2018-11-27 VITALS — BP 122/76 | HR 88 | Temp 98.3°F | Wt 253.0 lb

## 2018-11-27 DIAGNOSIS — D219 Benign neoplasm of connective and other soft tissue, unspecified: Secondary | ICD-10-CM

## 2018-11-27 DIAGNOSIS — Z0000 Encounter for general adult medical examination without abnormal findings: Secondary | ICD-10-CM

## 2018-11-27 DIAGNOSIS — B20 Human immunodeficiency virus [HIV] disease: Secondary | ICD-10-CM

## 2018-11-27 DIAGNOSIS — Z113 Encounter for screening for infections with a predominantly sexual mode of transmission: Secondary | ICD-10-CM

## 2018-11-27 MED ORDER — DARUNAVIR-COBICISTAT 800-150 MG PO TABS: 1.0000 | ORAL_TABLET | Freq: Every day | ORAL | 5 refills | Status: DC

## 2018-11-27 MED ORDER — ABACAVIR SULFATE-LAMIVUDINE 600-300 MG PO TABS: 1.0000 | ORAL_TABLET | Freq: Every day | ORAL | 5 refills | Status: DC

## 2018-11-27 NOTE — Assessment & Plan Note (Addendum)
Currently working with gynecology and stable at present.  Awaiting ultrasound tomorrow and future options for treatment.  Expressed concern for birth control/pregnancy with HIV medications.  Reassured medications can be adjusted as needed.  Contact for pharmacy provided if interaction questions arise.

## 2018-11-27 NOTE — Progress Notes (Signed)
Subjective:    Patient ID: Maria Nicholson, female    DOB: 1986-03-17, 33 y.o.   MRN: [id number]  Chief Complaint  Patient presents with  . HIV Positive/AIDS     HPI:  Maria Nicholson is a 33 y.o. female with HIV disease was last seen in the clinic on 10/17/2018 for her routine well woman exam.  She was last seen for HIV on 11/06/2017 with current ART regimen of Epzicom and Prezcobix with a viral load of 28 and CD4 count of 710.  She has had several missed appointments since that time. Most recent blood work for HIV complete don 10/17/18 with a viral load that was undetectable and CD4 count of 719.  She is due for Menveo and second dose of pneumovax.  Maria Nicholson has been taking her Epzicom and Prezcobix as prescribed no adverse side effects or missed doses.  Overall feeling well today.  She continues to work with gynecology as she is having issues with fibroids. Denies fevers, chills, night sweats, headaches, changes in vision, neck pain/stiffness, nausea, diarrhea, vomiting, lesions or rashes.  Maria Nicholson remains covered through [facility] and is up-to-date with coverage through September 2020.  She continues to to await return to work full-time and thinks this may be happening shortly.  Denies feelings of being down, depressed, or hopeless.  No recreational or illicit drug use.  She does continue to smoke tobacco on occasion and drinks alcohol on occasion.  She remains sexually active with her female partner.     Allergies  Allergen Reactions  . Amoxicillin Hives    Did it involve swelling of the face/tongue/throat, SOB, or low BP? No Did it involve sudden or severe rash/hives, skin peeling, or any reaction on the inside of your mouth or nose? Yes Did you need to seek medical attention at a hospital or doctor's office? Yes When did it last happen?33 yrs old If all above answers are "NO", may proceed with cephalosporin use.   Maria Nicholson Flavor Swelling      Outpatient Medications  Prior to Visit  Medication Sig Dispense Refill  . ferrous sulfate 325 (65 FE) MG tablet Take 1 tablet (325 mg total) by mouth daily. 30 tablet 0  . medroxyPROGESTERone (PROVERA) 5 MG tablet Take 1 tablet (5 mg total) by mouth daily. 30 tablet 3  . megestrol (MEGACE) 20 MG tablet Take 1 tablet (20 mg total) by mouth 2 (two) times daily. 90 tablet 1  . ondansetron (ZOFRAN ODT) 4 MG disintegrating tablet Take 1 tablet (4 mg total) by mouth every 8 (eight) hours as needed for nausea. 10 tablet 0  . traMADol (ULTRAM) 50 MG tablet Take 1 tablet (50 mg total) by mouth every 6 (six) hours as needed for severe pain. 30 tablet 0  . abacavir-lamiVUDine (EPZICOM) 600-300 MG tablet Take 1 tablet by mouth daily. 30 tablet 5  . darunavir-cobicistat (PREZCOBIX) 800-150 MG tablet Take 1 tablet by mouth daily with breakfast. Swallow whole. Do NOT crush, break or chew tablets. Take with food. 30 tablet 5   No facility-administered medications prior to visit.      Past Medical History:  Diagnosis Date  . Asthma   . Bronchitis   . HIV (human immunodeficiency virus infection) (Maria Nicholson)   . No pertinent past medical history      History reviewed. No pertinent surgical history.     Review of Systems  Constitutional: Negative for appetite change, chills, diaphoresis, fatigue, fever and unexpected weight change.  Eyes:       Negative for acute change in vision  Respiratory: Negative for chest tightness, shortness of breath and wheezing.   Cardiovascular: Negative for chest pain.  Gastrointestinal: Negative for diarrhea, nausea and vomiting.  Genitourinary: Negative for dysuria, pelvic pain and vaginal discharge.  Musculoskeletal: Negative for neck pain and neck stiffness.  Skin: Negative for rash.  Neurological: Negative for seizures, syncope, weakness and headaches.  Hematological: Negative for adenopathy. Does not bruise/bleed easily.  Psychiatric/Behavioral: Negative for hallucinations.       Objective:    BP 122/76   Pulse 88   Temp 98.3 F (36.8 C)   Wt 253 lb (114.8 kg)   BMI 39.63 kg/m  Nursing note and vital signs reviewed.  Physical Exam Constitutional:      General: [name]    Appearance: She is well-developed. She is obese.  Cardiovascular:     Rate and Rhythm: Normal rate and regular rhythm.     Heart sounds: Normal heart sounds.  Pulmonary:     Effort: Pulmonary effort is normal.     Breath sounds: Normal breath sounds.  Skin:    General: Skin is warm and dry.  Neurological:     Mental Status: She is alert and oriented to person, place, and time.  Psychiatric:        Behavior: Behavior normal.        Thought Content: Thought content normal.        Judgment: Judgment normal.        Assessment & Plan:   Problem List Items Addressed This Visit      Other   Human immunodeficiency virus (HIV) disease ([place]) - Primary    Maria Nicholson has well-controlled HIV disease with her current regimen of Epzicom and Prezcobix.  Most recent blood work shows she is virally suppressed with a CD4 count over 700.  No signs/symptoms of opportunistic infection or progressive HIV disease at present.  Discussed importance of continue take medications as prescribed.  Continue current dose of Epzicom and Prezcobix.  Plan for follow-up in 4 months or sooner if needed with lab work 1 to 2 weeks prior to appointment.      Relevant Medications   darunavir-cobicistat (PREZCOBIX) 800-150 MG tablet   abacavir-lamiVUDine (EPZICOM) 600-300 MG tablet   Other Relevant Orders   T-helper cell (CD4)- ([facility] clinic only)   HIV-1 RNA quant-no reflex-bld   CBC   Comprehensive metabolic panel   Healthcare maintenance     Cervical cancer screening up-to-date per recommendations.  Declines immunizations today.  Discussed importance of safe sexual practice to reduce risk of acquisition/transmission of STI.      Fibroids    Currently working with gynecology and  stable at present.  Awaiting ultrasound tomorrow and future options for treatment.  Expressed concern for birth control/pregnancy with HIV medications.  Reassured medications can be adjusted as needed.  Contact for pharmacy provided if interaction questions arise.       Other Visit Diagnoses    Screening for STDs (sexually transmitted diseases)       Relevant Orders   RPR       I am having Maria Nicholson maintain her medroxyPROGESTERone, ondansetron, ferrous sulfate, traMADol, megestrol, darunavir-cobicistat, and abacavir-lamiVUDine.   Meds ordered this encounter  Medications  . darunavir-cobicistat (PREZCOBIX) 800-150 MG tablet    Sig: Take 1 tablet by mouth daily with breakfast. Swallow whole. Do NOT crush, break or chew tablets. Take with  food.    Dispense:  30 tablet    Refill:  5    Order Specific Question:   Supervising Provider    Answer:   Carlyle Basques [[id number]]  . abacavir-lamiVUDine (EPZICOM) 600-300 MG tablet    Sig: Take 1 tablet by mouth daily.    Dispense:  30 tablet    Refill:  5    Order Specific Question:   Supervising Provider    Answer:   Carlyle Basques [[id number]]     Follow-up: Return in about 4 months (around 03/29/2019), or if symptoms worsen or fail to improve.   Terri Piedra, MSN, FNP-C Nurse Practitioner [facility] [place] number: [phone]

## 2018-11-27 NOTE — Telephone Encounter (Signed)
Called and spoke to patient she was instructed to wear a face mask of some sort, she is also aware no visitors are not allowed due to [place], ask the patient if she had any symptoms or have she been in contact with someone with the Virus and she stated NO.

## 2018-11-27 NOTE — Assessment & Plan Note (Signed)
   Cervical cancer screening up-to-date per recommendations.  Declines immunizations today.  Discussed importance of safe sexual practice to reduce risk of acquisition/transmission of STI.

## 2018-11-27 NOTE — Assessment & Plan Note (Signed)
[name] has well-controlled HIV disease with her current regimen of Epzicom and Prezcobix.  Most recent blood work shows she is virally suppressed with a CD4 count over 700.  No signs/symptoms of opportunistic infection or progressive HIV disease at present.  Discussed importance of continue take medications as prescribed.  Continue current dose of Epzicom and Prezcobix.  Plan for follow-up in 4 months or sooner if needed with lab work 1 to 2 weeks prior to appointment.

## 2018-11-27 NOTE — Patient Instructions (Signed)
Nice to see you.  Please continue to take your Epzicom and Prezcobix as prescribed.  Refills of your medication have been sent to the pharmacy.   Good luck with your procedures.  We will plan to follow up in 4 months or sooner if needed with lab work 1-2 weeks prior to your appointment.

## 2018-11-28 ENCOUNTER — Ambulatory Visit (INDEPENDENT_AMBULATORY_CARE_PROVIDER_SITE_OTHER): Payer: Self-pay | Admitting: Obstetrics and Gynecology

## 2018-11-28 ENCOUNTER — Encounter: Payer: Self-pay | Admitting: Obstetrics and Gynecology

## 2018-11-28 ENCOUNTER — Other Ambulatory Visit: Payer: Self-pay

## 2018-11-28 VITALS — BP 121/72 | HR 99 | Temp 98.1°F | Wt 254.1 lb

## 2018-11-28 DIAGNOSIS — Z3189 Encounter for other procreative management: Secondary | ICD-10-CM

## 2018-11-28 DIAGNOSIS — D250 Submucous leiomyoma of uterus: Secondary | ICD-10-CM

## 2018-11-28 DIAGNOSIS — D251 Intramural leiomyoma of uterus: Secondary | ICD-10-CM

## 2018-11-28 DIAGNOSIS — N939 Abnormal uterine and vaginal bleeding, unspecified: Secondary | ICD-10-CM

## 2018-11-28 NOTE — Progress Notes (Signed)
GYNECOLOGY OFFICE FOLLOW UP NOTE  History:  33 y.o. [id number] here today for follow up for AUB. Was on provera since she was seen in office in 4/20202. Had constant spotting for April and May. Contacted office at the end of May for bleeding, was switched to Megace 20 mg BID, she felt her bleeding got worse. Bleeding got heavier while on Megace. Took megace x 1 week, stopped it herself, then bleeding stopped 3 days later. Has not had any bleeding since.  She is interested in future fertility and thinks she will be ready for IUI in 1-2 years. Has not seen REI.  Past Medical History:  Diagnosis Date   Asthma    Bronchitis    HIV (human immunodeficiency virus infection) ([place])    No pertinent past medical history     Past Surgical History:  Procedure Laterality Date   DILATION AND CURETTAGE, DIAGNOSTIC / THERAPEUTIC       Current Outpatient Medications:    abacavir-lamiVUDine (EPZICOM) 600-300 MG tablet, Take 1 tablet by mouth daily., Disp: 30 tablet, Rfl: 5   darunavir-cobicistat (PREZCOBIX) 800-150 MG tablet, Take 1 tablet by mouth daily with breakfast. Swallow whole. Do NOT crush, break or chew tablets. Take with food., Disp: 30 tablet, Rfl: 5   ferrous sulfate 325 (65 FE) MG tablet, Take 1 tablet (325 mg total) by mouth daily., Disp: 30 tablet, Rfl: 0   medroxyPROGESTERone (PROVERA) 5 MG tablet, Take 1 tablet (5 mg total) by mouth daily. (Patient not taking: Reported on 11/28/2018), Disp: 30 tablet, Rfl: 3   megestrol (MEGACE) 20 MG tablet, Take 1 tablet (20 mg total) by mouth 2 (two) times daily. (Patient not taking: Reported on 11/28/2018), Disp: 90 tablet, Rfl: 1   ondansetron (ZOFRAN ODT) 4 MG disintegrating tablet, Take 1 tablet (4 mg total) by mouth every 8 (eight) hours as needed for nausea. (Patient not taking: Reported on 11/28/2018), Disp: 10 tablet, Rfl: 0   traMADol (ULTRAM) 50 MG tablet, Take 1 tablet (50 mg total) by mouth every 6 (six) hours as needed for severe  pain. (Patient not taking: Reported on 11/28/2018), Disp: 30 tablet, Rfl: 0  The following portions of the patient's history were reviewed and updated as appropriate: allergies, current medications, past family history, past medical history, past social history, past surgical history and problem list.   Review of Systems:  Pertinent items noted in HPI and remainder of comprehensive ROS otherwise negative.   Objective:  Physical Exam BP 121/72    Pulse 99    Temp 98.1 F (36.7 C)    Wt 254 lb 1.6 oz (115.3 kg)    LMP 11/02/2018 (Approximate)    BMI 39.80 kg/m  CONSTITUTIONAL: Well-developed, well-nourished female in no acute distress.  HENT:  Normocephalic, atraumatic. External right and left ear normal. Oropharynx is clear and moist EYES: Conjunctivae and EOM are normal. Pupils are equal, round, and reactive to light. No scleral icterus.  NECK: Normal range of motion, supple, no masses SKIN: Skin is warm and dry. No rash noted. Not diaphoretic. No erythema. No pallor. NEUROLOGIC: Alert and oriented to person, place, and time. Normal reflexes, muscle tone coordination. No cranial nerve deficit noted. PSYCHIATRIC: Normal mood and affect. Normal behavior. Normal judgment and thought content. CARDIOVASCULAR: Normal heart rate noted RESPIRATORY: Effort normal, no problems with respiration noted ABDOMEN: Soft, no distention noted.   PELVIC: deferred MUSCULOSKELETAL: Normal range of motion. No edema noted.  Assessment & Plan:   1. Encounter for fertility planning -  Patient not wanting to get pregnant now, however she states she will want to do insemination in 1-2 years. Has not seen REI. Reviewed that there are multiple options to manage fibroids, however several of them are not recommended if future fertility is desired. Reviewed that she may be a candidate for a myomectomy, however as she has not actively attempted to achieve pregnancy, it is not clear if her fibroids are interfering with her  fertility. She feels that they are interfering with her fertility as she has gotten pregnant twice in the distant past and none in the recent past despite not using contraception. Reviewed that REI may feel she needs to have myomectomy, particularly as fibroids are encroaching on endometrial cavity, reviewed that with myomectomy, she would be advised to wait prior to getting pregnant. Offered to have patient do consult with REI now to discuss options and see how they feel about her fibroids, whether she would be candidate for myomectomy now and then base future planning for AUB and fibroids on their recommendation. She is agreeable to this plan. - Ambulatory referral to Endocrinology  2. Intramural and submucous leiomyoma of uterus See above  3. Abnormal uterine bleeding (AUB) Patient not currently bleeding. Had previously recommended IUD for management of bleeding, however with [place], endometrial cavity appears distorted. Would recommend only placing IUD under ultrasound guidance. See above, she will have consult with REI and use provera for bleeding now as it made her periods lighter, although did not stop them, then plan for possible IUD pending her consult with REI.  Routine preventative health maintenance measures emphasized. Please refer to After Visit Summary for other counseling recommendations.   Return for patient will call for follow up.  Total face-to-face time with patient: 25 minutes. Over 50% of encounter was spent on counseling and coordination of care.  Feliz Beam, M.D. Attending Center for [employer] manager)

## 2018-11-29 ENCOUNTER — Encounter: Payer: Self-pay | Admitting: Obstetrics and Gynecology

## 2018-12-03 ENCOUNTER — Other Ambulatory Visit: Payer: Self-pay

## 2018-12-03 ENCOUNTER — Emergency Department (HOSPITAL_COMMUNITY)
Admission: EM | Admit: 2018-12-03 | Discharge: 2018-12-03 | Disposition: A | Discharge status: Home / Self Care | Payer: Self-pay | Attending: Emergency Medicine | Admitting: Emergency Medicine

## 2018-12-03 ENCOUNTER — Encounter (HOSPITAL_COMMUNITY): Payer: Self-pay | Admitting: Pharmacy Technician

## 2018-12-03 DIAGNOSIS — L0291 Cutaneous abscess, unspecified: Secondary | ICD-10-CM

## 2018-12-03 DIAGNOSIS — L02412 Cutaneous abscess of left axilla: Secondary | ICD-10-CM | POA: Insufficient documentation

## 2018-12-03 DIAGNOSIS — B20 Human immunodeficiency virus [HIV] disease: Secondary | ICD-10-CM | POA: Insufficient documentation

## 2018-12-03 DIAGNOSIS — F17210 Nicotine dependence, cigarettes, uncomplicated: Secondary | ICD-10-CM | POA: Insufficient documentation

## 2018-12-03 MED ORDER — IBUPROFEN 400 MG PO TABS
600.0000 mg | ORAL_TABLET | Freq: Once | ORAL | Status: AC
  Administered 2018-12-03: 600 mg via ORAL
  Filled 2018-12-03: qty 1

## 2018-12-03 MED ORDER — DOXYCYCLINE HYCLATE 100 MG PO CAPS: 100.0000 mg | ORAL_CAPSULE | Freq: Two times a day (BID) | ORAL | 0 refills | Status: DC

## 2018-12-03 MED ORDER — LIDOCAINE-EPINEPHRINE (PF) 2 %-1:200000 IJ SOLN
20.0000 mL | Freq: Once | INTRAMUSCULAR | Status: AC
  Administered 2018-12-03: 20 mL
  Filled 2018-12-03: qty 20

## 2018-12-03 NOTE — ED Triage Notes (Signed)
Pt reports abscess to L axillary onset 2 days ago.

## 2018-12-03 NOTE — ED Notes (Signed)
Patient Alert and oriented to baseline. Stable and ambulatory to baseline. Patient verbalized understanding of the discharge instructions.  Patient belongings were taken by the patient.   

## 2018-12-03 NOTE — ED Notes (Signed)
I&D cart at bedside 

## 2018-12-03 NOTE — ED Notes (Signed)
Assisted I&D of abscess left under arm.

## 2018-12-03 NOTE — ED Provider Notes (Signed)
[facility] EMERGENCY DEPARTMENT Provider Note   CSN: [id number] Arrival date & time: 12/03/18  1757   History   Chief Complaint Chief Complaint  Patient presents with  . L armpit abcess    HPI Maria Nicholson is a 33 y.o. female.     HPI   33 year old female presents today with abscess to her left axilla.  She notes 2-day history of left axillary abscess.  She notes pain to the area.  She notes a history of the same in the right.  She denies any fever, denies any fatigue or any systemic symptoms.  Does note a history of HIV.  It appears her last labs were at the end of April showing normal CD4.   Past Medical History:  Diagnosis Date  . Asthma   . Bronchitis   . HIV (human immunodeficiency virus infection) ([place])   . No pertinent past medical history     Patient Active Problem List   Diagnosis Date Noted  . Healthcare maintenance 11/27/2018  . Fibroids 11/27/2018  . Human immunodeficiency virus (HIV) disease ([place]) 11/06/2017  . Eczema 11/17/2011    Past Surgical History:  Procedure Laterality Date  . DILATION AND CURETTAGE, DIAGNOSTIC / THERAPEUTIC       OB History    Gravida  3   Para  0   Term      Preterm      AB  3   Living  0     SAB  2   TAB  1   Ectopic      Multiple      Live Births               Home Medications    Prior to Admission medications   Medication Sig Start Date End Date Taking? Authorizing Provider  abacavir-lamiVUDine (EPZICOM) 600-300 MG tablet Take 1 tablet by mouth daily. 11/27/18   Golden Circle, FNP  darunavir-cobicistat (PREZCOBIX) 800-150 MG tablet Take 1 tablet by mouth daily with breakfast. Swallow whole. Do NOT crush, break or chew tablets. Take with food. 11/27/18   Golden Circle, FNP  doxycycline (VIBRAMYCIN) 100 MG capsule Take 1 capsule (100 mg total) by mouth 2 (two) times daily. 12/03/18   Josian Lanese, Dellis Filbert, PA-C  ferrous sulfate 325 (65 FE) MG tablet Take 1 tablet (325 mg total)  by mouth daily. 10/07/18   Larene Pickett, PA-C  medroxyPROGESTERone (PROVERA) 5 MG tablet Take 1 tablet (5 mg total) by mouth daily. Patient not taking: Reported on 11/28/2018 09/19/18   Sloan Leiter, MD  megestrol (MEGACE) 20 MG tablet Take 1 tablet (20 mg total) by mouth 2 (two) times daily. Patient not taking: Reported on 11/28/2018 11/15/18   Chancy Milroy, MD  ondansetron (ZOFRAN ODT) 4 MG disintegrating tablet Take 1 tablet (4 mg total) by mouth every 8 (eight) hours as needed for nausea. Patient not taking: Reported on 11/28/2018 10/07/18   Larene Pickett, PA-C  traMADol (ULTRAM) 50 MG tablet Take 1 tablet (50 mg total) by mouth every 6 (six) hours as needed for severe pain. Patient not taking: Reported on 11/28/2018 11/14/18   Chancy Milroy, MD    Family History Family History  Problem Relation Age of Onset  . Cancer Maternal Grandmother        throat  . Cancer Father 68       spinal   . Cancer Maternal Aunt 62       throat  Social History Social History   Tobacco Use  . Smoking status: Current Some Day Smoker    Packs/day: 0.50    Years: 2.00    Pack years: 1.00    Types: Cigarettes  . Smokeless tobacco: Never Used  . Tobacco comment: decreased from one pack daily     Substance Use Topics  . Alcohol use: Yes    Comment: Occasionally   . Drug use: Not Currently    Types: Marijuana     Allergies   Amoxicillin and Coconut flavor   Review of Systems Review of Systems  All other systems reviewed and are negative.    Physical Exam Updated Vital Signs BP 119/82 (BP Location: Right Arm)   Pulse 94   Temp 98.8 F (37.1 C) (Oral)   Resp 20   Ht 5\' 7"  (1.702 m)   Wt 115.2 kg   SpO2 99%   BMI 39.78 kg/m   Physical Exam Vitals signs and nursing note reviewed.  Constitutional:      Appearance: She is well-developed.  HENT:     Head: Normocephalic and atraumatic.  Eyes:     General: No scleral icterus.       Right eye: No discharge.        Left  eye: No discharge.     Conjunctiva/sclera: Conjunctivae normal.     Pupils: Pupils are equal, round, and reactive to light.  Neck:     Musculoskeletal: Normal range of motion.     Vascular: No JVD.     Trachea: No tracheal deviation.  Pulmonary:     Effort: Pulmonary effort is normal.     Breath sounds: No stridor.  Musculoskeletal:     Comments: 3 cm abscess to left axilla with central fluctuance and surrounding induration no significant overlying redness, no purulence  Neurological:     Mental Status: She is alert and oriented to person, place, and time.     Coordination: Coordination normal.  Psychiatric:        Behavior: Behavior normal.        Thought Content: Thought content normal.        Judgment: Judgment normal.      ED Treatments / Results  Labs (all labs ordered are listed, but only abnormal results are displayed) Labs Reviewed - No data to display  EKG None  Radiology No results found.  Procedures .Marland KitchenIncision and Drainage  Date/Time: 12/03/2018 8:38 PM Performed by: Okey Regal, PA-C Authorized by: Okey Regal, PA-C   Consent:    Consent obtained:  Verbal   Consent given by:  Patient   Risks discussed:  Bleeding, incomplete drainage, infection and damage to other organs   Alternatives discussed:  Alternative treatment and no treatment Location:    Type:  Abscess   Size:  3   Location: Left axilla. Pre-procedure details:    Skin preparation:  Chloraprep Anesthesia (see MAR for exact dosages):    Anesthesia method:  Local infiltration   Local anesthetic:  Lidocaine 2% WITH epi Procedure type:    Complexity:  Simple Procedure details:    Incision types:  Single straight   Incision depth:  Dermal   Scalpel blade:  11   Wound management:  Probed and deloculated and irrigated with saline   Drainage:  Purulent   Drainage amount:  Moderate   Wound treatment:  Wound left open   Packing materials:  None Post-procedure details:    Patient  tolerance of procedure:  Tolerated well, no immediate  complications   (including critical care time)  Medications Ordered in ED Medications  ibuprofen (ADVIL) tablet 600 mg (600 mg Oral Given 12/03/18 1853)  lidocaine-EPINEPHrine (XYLOCAINE W/EPI) 2 %-1:200000 (PF) injection 20 mL (20 mLs Infiltration Given 12/03/18 1923)     Initial Impression / Assessment and Plan / ED Course  I have reviewed the triage vital signs and the nursing notes.  Pertinent labs & imaging results that were available during my care of the patient were reviewed by me and considered in my medical decision making (see chart for details).        33 year old female with axillary abscess.  No signs of systemic illness.  She did have a temperature of 100 and a pulse of 106 but denied any systemic symptoms.  Her repeat vital signs were 98.8 and a pulse of 94.  She is well-appearing in no acute distress.  She will be started on antibiotics.  Encouraged to return immediately she develops any new or worsening signs or symptoms.  She verbalized understanding and agreement to today's plan.  Final Clinical Impressions(s) / ED Diagnoses   Final diagnoses:  Abscess    ED Discharge Orders         Ordered    doxycycline (VIBRAMYCIN) 100 MG capsule  2 times daily     12/03/18 2003           Francee Gentile 12/03/18 2040    Virgel Manifold, MD 12/04/18 1500

## 2018-12-03 NOTE — Discharge Instructions (Addendum)
Please read attached information. If you experience any new or worsening signs or symptoms please return to the emergency room for evaluation. Please follow-up with your primary care provider or specialist as discussed. Please use medication prescribed only as directed and discontinue taking if you have any concerning signs or symptoms.   °

## 2019-01-01 IMAGING — DX DG CHEST 2V
2 series · 2 of 2 positions shown · non-contrast
Comparison: None.

CLINICAL DATA: Cough.

EXAM:
CHEST  2 VIEW

[chest pa]
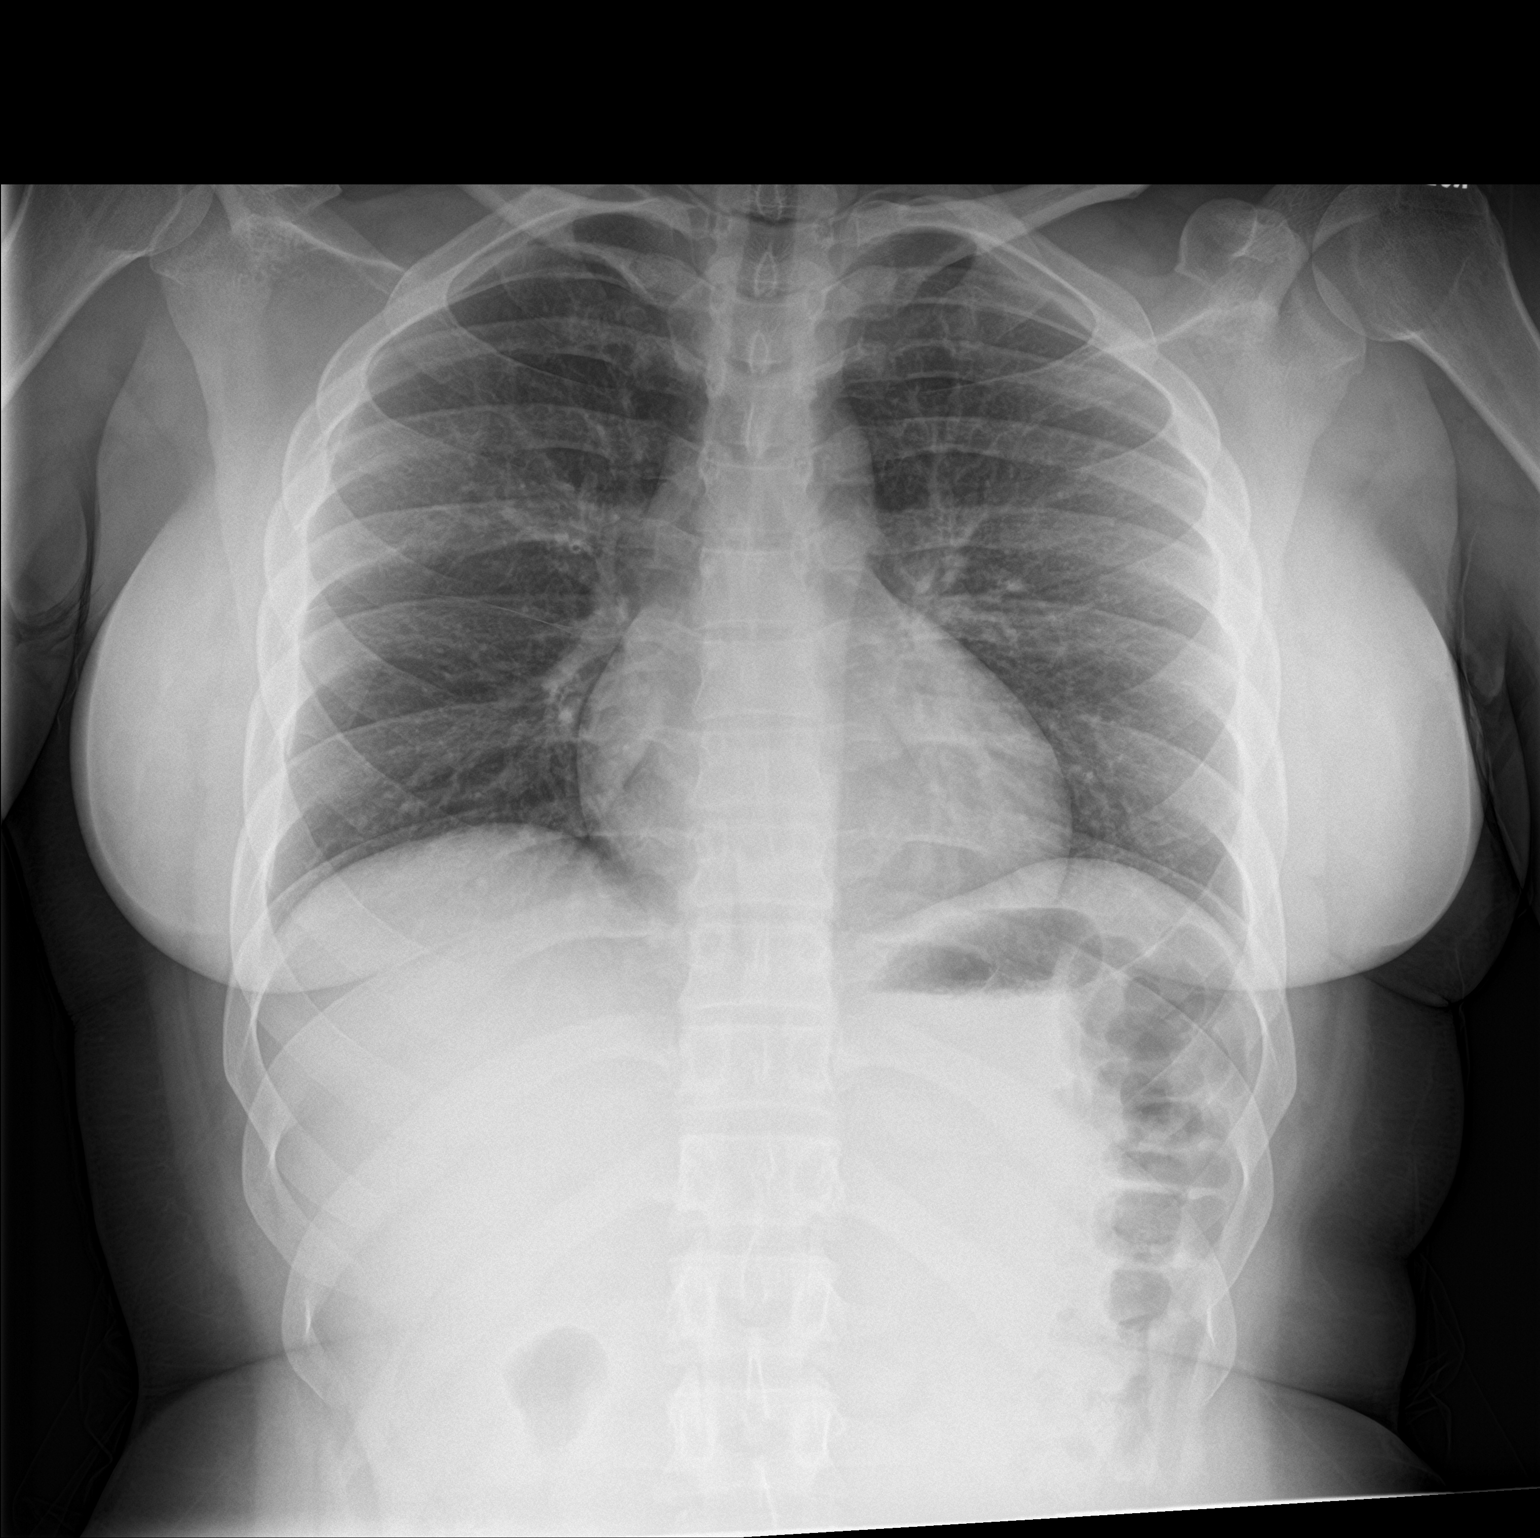

[chest lat]
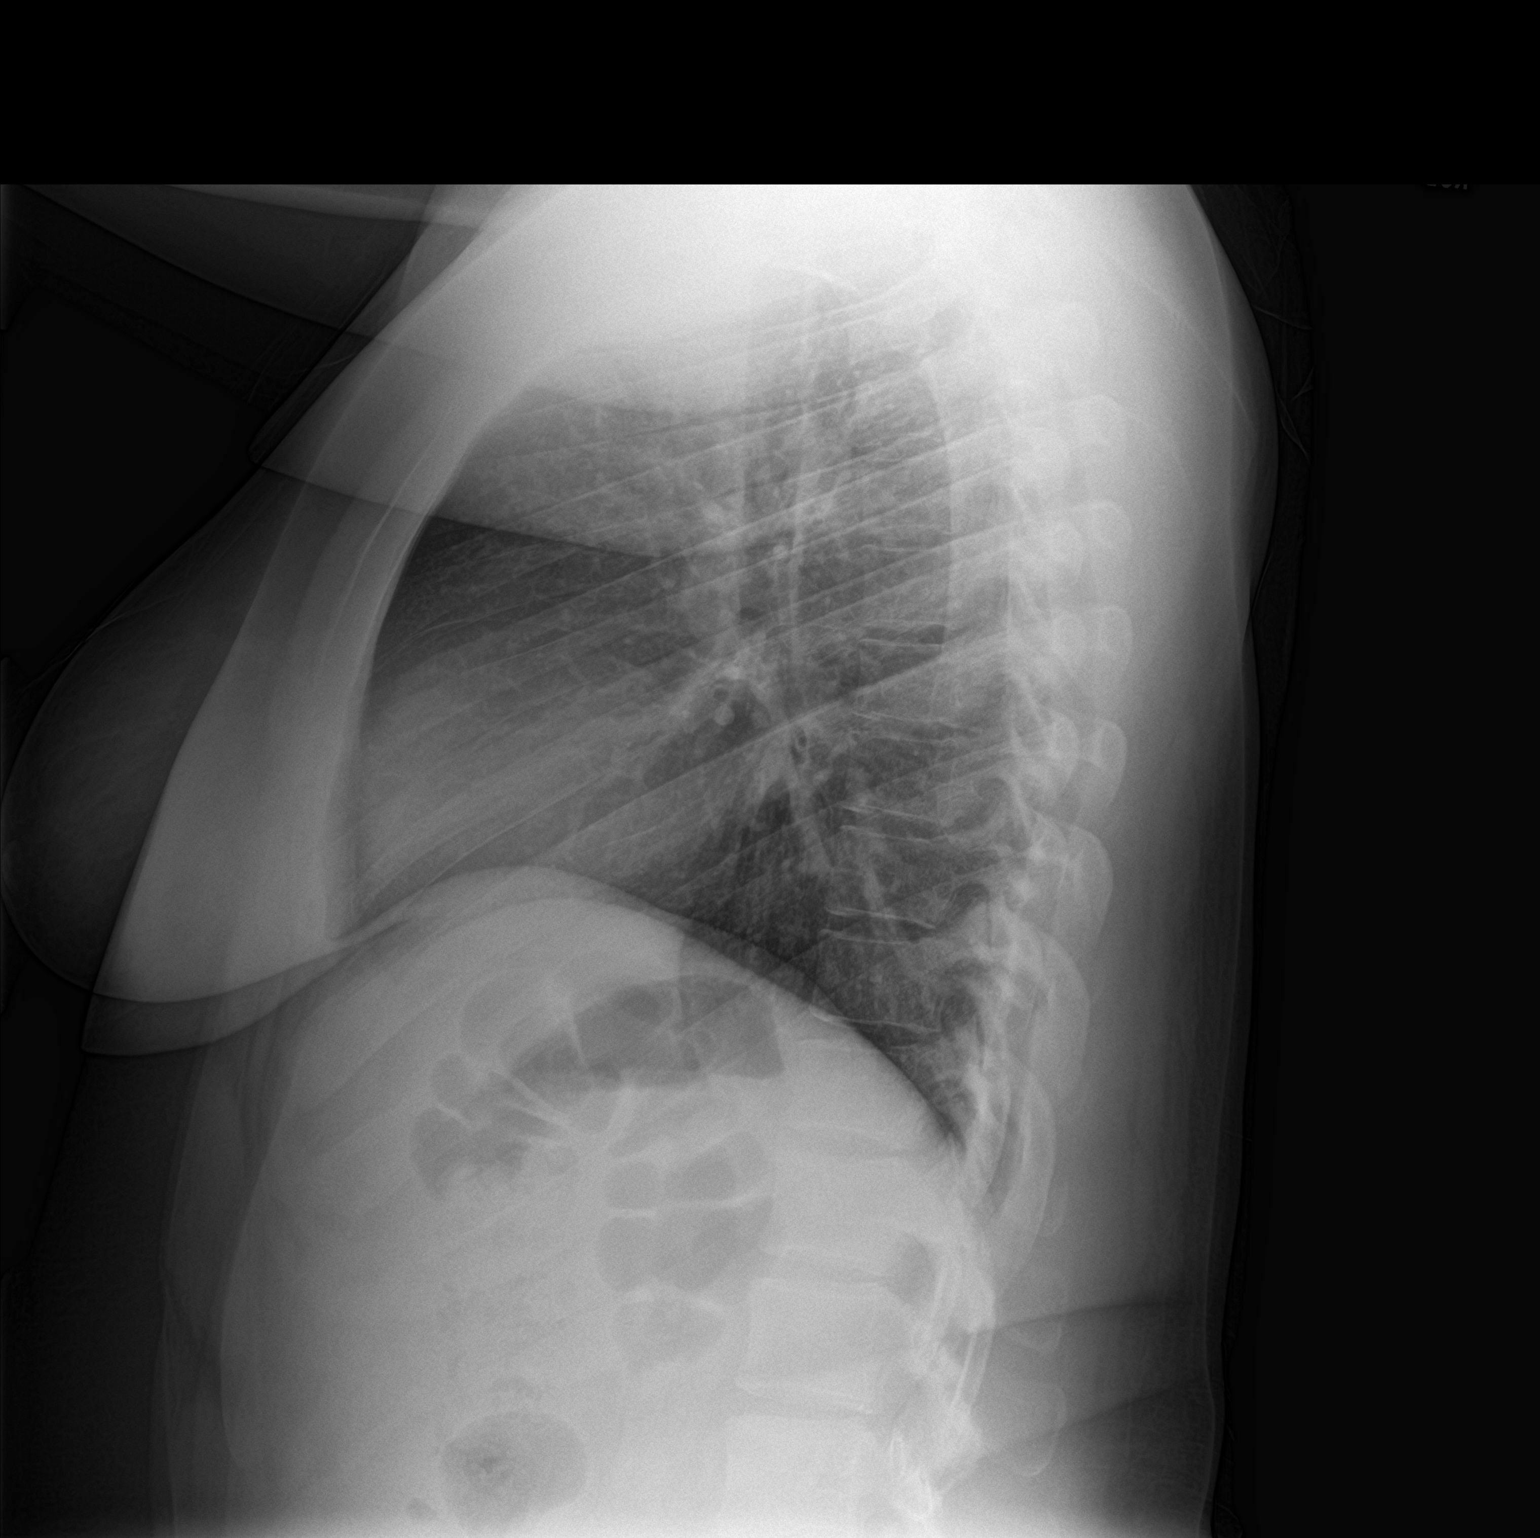

[2 of 2 positions shown; findings below may reference images not displayed]

FINDINGS: The heart size and mediastinal contours are within normal limits.
Both lungs are clear. No pneumothorax or pleural effusion is noted.
The visualized skeletal structures are unremarkable.
IMPRESSION: No active cardiopulmonary disease.

## 2019-01-17 ENCOUNTER — Encounter

## 2019-01-28 ENCOUNTER — Telehealth: Payer: Self-pay | Admitting: Family

## 2019-01-28 NOTE — Telephone Encounter (Signed)
COVID-19 Pre-Screening Questions: ° °Do you currently have a fever (>100 °F), chills or unexplained body aches? N ° °Are you currently experiencing new cough, shortness of breath, sore throat, runny nose? N °•  °Have you recently travelled outside the state of [place] in the last 14 days? N  °•  °Have you been in contact with someone that is currently pending confirmation of Covid19 testing or has been confirmed to have the Covid19 virus?  N ° °**If the patient answers NO to ALL questions -  advise the patient to please call the clinic before coming to the office should any symptoms develop.  ° ° ° °

## 2019-01-29 ENCOUNTER — Encounter: Payer: Self-pay | Admitting: Family

## 2019-01-29 ENCOUNTER — Ambulatory Visit (INDEPENDENT_AMBULATORY_CARE_PROVIDER_SITE_OTHER): Payer: Self-pay | Admitting: Family

## 2019-01-29 ENCOUNTER — Other Ambulatory Visit: Payer: Self-pay

## 2019-01-29 ENCOUNTER — Ambulatory Visit: Payer: Self-pay

## 2019-01-29 VITALS — Temp 98.4°F

## 2019-01-29 DIAGNOSIS — Z113 Encounter for screening for infections with a predominantly sexual mode of transmission: Secondary | ICD-10-CM

## 2019-01-29 DIAGNOSIS — B20 Human immunodeficiency virus [HIV] disease: Secondary | ICD-10-CM

## 2019-01-29 DIAGNOSIS — Z0000 Encounter for general adult medical examination without abnormal findings: Secondary | ICD-10-CM

## 2019-01-29 DIAGNOSIS — N898 Other specified noninflammatory disorders of vagina: Secondary | ICD-10-CM | POA: Insufficient documentation

## 2019-01-29 MED ORDER — FLUCONAZOLE 150 MG PO TABS: 150.0000 mg | ORAL_TABLET | Freq: Once | ORAL | 1 refills | Status: AC

## 2019-01-29 MED ORDER — ABACAVIR SULFATE-LAMIVUDINE 600-300 MG PO TABS: 1.0000 | ORAL_TABLET | Freq: Every day | ORAL | 5 refills | Status: DC

## 2019-01-29 MED ORDER — PREZCOBIX 800-150 MG PO TABS: 1.0000 | ORAL_TABLET | Freq: Every day | ORAL | 5 refills | Status: DC

## 2019-01-29 NOTE — Assessment & Plan Note (Signed)
Maria Nicholson has new onset vaginal itching with concern for candidiasis.  We will check urine cytology for gonorrhea/chlamydia and treat with 150 mg fluconazole once.  Additional treatment pending results as needed

## 2019-01-29 NOTE — Progress Notes (Signed)
Subjective:    Patient ID: Maria Nicholson, adult    DOB: 1985-08-06, 33 y.o.   MRN: [id number]  Chief Complaint  Patient presents with  . HIV Positive/AIDS     HPI:  Maria Nicholson is a 33 y.o. adult with HIV disease who was last seen in the office on 11/27/2018 with good adherence and tolerance to her ART regimen of Epzicom and Prezcobix.  Most recent blood work at the time showed a viral load that was undetectable with CD4 count of 700.  Healthcare maintenance due includes Menveo for meningitis vaccination.  Maria Nicholson continues to take her Prezcobix and Epzicom as prescribed no adverse side effects or missed doses since her last office visit.  She does believe she may have a vaginal yeast infection starting 1 to 2 days ago following a broken condom during intercourse. Denies fevers, chills, night sweats, headaches, changes in vision, neck pain/stiffness, nausea, diarrhea, vomiting, lesions or rashes.   Maria Nicholson remains covered through [facility] and has no problems obtaining her medication from the pharmacy.  Denies feelings of being down, depressed, or hopeless.  She does continue to smoke approximately 2 to 3 cigarettes/day.  No recreational or illicit drug use and alcohol use is on occasion.  She does remain sexually active and uses condoms.  Currently working with gynecology to determine resolution of issues with fibroids.   Allergies  Allergen Reactions  . Amoxicillin Hives    Did it involve swelling of the face/tongue/throat, SOB, or low BP? No Did it involve sudden or severe rash/hives, skin peeling, or any reaction on the inside of your mouth or nose? Yes Did you need to seek medical attention at a hospital or doctor's office? Yes When did it last happen?32 yrs old If all above answers are "NO", may proceed with cephalosporin use.   [name] Flavor Swelling      Outpatient Medications Prior to Visit  Medication Sig Dispense Refill  . ferrous sulfate 325 (65 FE) MG  tablet Take 1 tablet (325 mg total) by mouth daily. 30 tablet 0  . abacavir-lamiVUDine (EPZICOM) 600-300 MG tablet Take 1 tablet by mouth daily. 30 tablet 5  . darunavir-cobicistat (PREZCOBIX) 800-150 MG tablet Take 1 tablet by mouth daily with breakfast. Swallow whole. Do NOT crush, break or chew tablets. Take with food. 30 tablet 5  . doxycycline (VIBRAMYCIN) 100 MG capsule Take 1 capsule (100 mg total) by mouth 2 (two) times daily. (Patient not taking: Reported on 01/29/2019) 20 capsule 0  . medroxyPROGESTERone (PROVERA) 5 MG tablet Take 1 tablet (5 mg total) by mouth daily. (Patient not taking: Reported on 11/28/2018) 30 tablet 3  . megestrol (MEGACE) 20 MG tablet Take 1 tablet (20 mg total) by mouth 2 (two) times daily. (Patient not taking: Reported on 11/28/2018) 90 tablet 1  . ondansetron (ZOFRAN ODT) 4 MG disintegrating tablet Take 1 tablet (4 mg total) by mouth every 8 (eight) hours as needed for nausea. (Patient not taking: Reported on 11/28/2018) 10 tablet 0  . traMADol (ULTRAM) 50 MG tablet Take 1 tablet (50 mg total) by mouth every 6 (six) hours as needed for severe pain. (Patient not taking: Reported on 11/28/2018) 30 tablet 0   No facility-administered medications prior to visit.      Past Medical History:  Diagnosis Date  . Asthma   . Bronchitis   . HIV (human immunodeficiency virus infection) ([place])   . No pertinent past medical history  Past Surgical History:  Procedure Laterality Date  . DILATION AND CURETTAGE, DIAGNOSTIC / THERAPEUTIC         Review of Systems  Constitutional: Negative for appetite change, chills, diaphoresis, fatigue, fever and unexpected weight change.  Eyes:       Negative for acute change in vision  Respiratory: Negative for chest tightness, shortness of breath and wheezing.   Cardiovascular: Negative for chest pain.  Gastrointestinal: Negative for diarrhea, nausea and vomiting.  Genitourinary: Negative for dysuria.  Musculoskeletal:  Negative for neck pain and neck stiffness.  Skin: Negative for rash.  Neurological: Negative for seizures, syncope, weakness and headaches.  Hematological: Negative for adenopathy. Does not bruise/bleed easily.  Psychiatric/Behavioral: Negative for hallucinations.      Objective:    Temp 98.4 F (36.9 C) (Oral)   LMP 01/21/2019  Nursing note and vital signs reviewed.  Physical Exam Constitutional:      General: [name]    Appearance: She is well-developed.  Eyes:     Conjunctiva/sclera: Conjunctivae normal.  Neck:     Musculoskeletal: Neck supple.  Cardiovascular:     Rate and Rhythm: Normal rate and regular rhythm.     Heart sounds: Normal heart sounds. No murmur. No friction rub. No gallop.   Pulmonary:     Effort: Pulmonary effort is normal. No respiratory distress.     Breath sounds: Normal breath sounds. No wheezing or rales.  Chest:     Chest wall: No tenderness.  Abdominal:     General: Bowel sounds are normal.     Palpations: Abdomen is soft.     Tenderness: There is no abdominal tenderness.  Lymphadenopathy:     Cervical: No cervical adenopathy.  Skin:    General: Skin is warm and dry.     Findings: No rash.  Neurological:     Mental Status: She is alert and oriented to person, place, and time.  Psychiatric:        Behavior: Behavior normal.        Thought Content: Thought content normal.        Judgment: Judgment normal.      Depression screen [facility] 2/9 01/29/2019 11/06/2017 11/18/2011 11/15/2011  Decreased Interest 0 3 0 1  Down, Depressed, Hopeless 0 3 0 1  PHQ - 2 Score 0 6 0 2       Assessment & Plan:   Problem List Items Addressed This Visit      Musculoskeletal and Integument   Vaginal itching    Maria Nicholson has new onset vaginal itching with concern for candidiasis.  We will check urine cytology for gonorrhea/chlamydia and treat with 150 mg fluconazole once.  Additional treatment pending results as needed        Other    Human immunodeficiency virus (HIV) disease ([place]) - Primary    Maria Nicholson has generally well-controlled HIV disease with good adherence and tolerance to her ART regimen of Prezcobix and Epzicom.  No signs/symptoms of opportunistic infection or progressive HIV disease.  Financial assistance renewed today.  Check blood work today.  Continue current dose of Epzicom and Prezcobix.  Plan for follow-up in 4 months or sooner if needed with lab work 1 to 2 weeks prior to appointment.      Relevant Medications   fluconazole (DIFLUCAN) 150 MG tablet   abacavir-lamiVUDine (EPZICOM) 600-300 MG tablet   darunavir-cobicistat (PREZCOBIX) 800-150 MG tablet   Other Relevant Orders   COMPLETE METABOLIC PANEL WITH GFR  HIV-1 RNA quant-no reflex-bld   T-helper cell (CD4)- ([facility] clinic only)   Healthcare maintenance     Declines Menveo today.  Continues to work on birth Gaffer with gynecology.  Discussed importance of safe sexual practice to reduce risk of acquisition/transmission of STI.       Other Visit Diagnoses    Screening for STDs (sexually transmitted diseases)       Relevant Orders   RPR   Urine cytology ancillary only([place])       I have discontinued Caryl Pina T. Hollenbeck's medroxyPROGESTERone, ondansetron, traMADol, megestrol, and doxycycline. I am also having her start on fluconazole. Additionally, I am having her maintain her ferrous sulfate, abacavir-lamiVUDine, and Prezcobix.   Meds ordered this encounter  Medications  . fluconazole (DIFLUCAN) 150 MG tablet    Sig: Take 1 tablet (150 mg total) by mouth once for 1 dose. May repeat in 72 hours if needed.    Dispense:  1 tablet    Refill:  1    Order Specific Question:   Supervising Provider    Answer:   Carlyle Basques [[id number]]  . abacavir-lamiVUDine (EPZICOM) 600-300 MG tablet    Sig: Take 1 tablet by mouth daily.    Dispense:  30 tablet    Refill:  5    Order Specific Question:   Supervising Provider     Answer:   Carlyle Basques [[id number]]  . darunavir-cobicistat (PREZCOBIX) 800-150 MG tablet    Sig: Take 1 tablet by mouth daily with breakfast. Swallow whole. Do NOT crush, break or chew tablets. Take with food.    Dispense:  30 tablet    Refill:  5    Order Specific Question:   Supervising Provider    Answer:   Carlyle Basques [[id number]]     Follow-up: Return in about 4 months (around 05/31/2019), or if symptoms worsen or fail to improve.   Terri Piedra, MSN, FNP-C Nurse Practitioner [facility] for Infectious Disease [place] number: [phone]

## 2019-01-29 NOTE — Assessment & Plan Note (Signed)
Ms. Moudy has generally well-controlled HIV disease with good adherence and tolerance to her ART regimen of Prezcobix and Epzicom.  No signs/symptoms of opportunistic infection or progressive HIV disease.  Financial assistance renewed today.  Check blood work today.  Continue current dose of Epzicom and Prezcobix.  Plan for follow-up in 4 months or sooner if needed with lab work 1 to 2 weeks prior to appointment.

## 2019-01-29 NOTE — Assessment & Plan Note (Signed)
   Declines Menveo today.  Continues to work on birth Gaffer with gynecology.  Discussed importance of safe sexual practice to reduce risk of acquisition/transmission of STI.

## 2019-01-29 NOTE — Patient Instructions (Addendum)
Nice to see you!  We will check your blood work today.  Please continue to take your Epizicom and Prezcobix as prescribed.  Refills sent to pharmacy.   Fluconazole has been sent to her pharmacy.   Recommend a flu vaccine in September.  Plan for follow up in 4 months or sooner if needed with lab work 1-2 weeks prior to appointment.

## 2019-01-30 LAB — T-HELPER CELL (CD4) - (RCID CLINIC ONLY)
CD4 % Helper T Cell: 37 % (ref 33–65)
CD4 T Cell Abs: 638 /uL (ref 400–1790)

## 2019-01-31 LAB — URINE CYTOLOGY ANCILLARY ONLY
Chlamydia: NEGATIVE
Neisseria Gonorrhea: NEGATIVE

## 2019-02-01 LAB — COMPLETE METABOLIC PANEL WITH GFR
AG Ratio: 1.5 (calc) (ref 1.0–2.5)
ALT: 11 U/L (ref 6–29)
AST: 13 U/L (ref 10–30)
Albumin: 4.3 g/dL (ref 3.6–5.1)
Alkaline phosphatase (APISO): 58 U/L (ref 31–125)
BUN: 16 mg/dL (ref 7–25)
CO2: 27 mmol/L (ref 20–32)
Calcium: 9.6 mg/dL (ref 8.6–10.2)
Chloride: 103 mmol/L (ref 98–110)
Creat: 0.96 mg/dL (ref 0.50–1.10)
GFR, Est African American: 92 mL/min/{1.73_m2} (ref >=60)
GFR, Est Non African American: 79 mL/min/{1.73_m2} (ref >=60)
Globulin: 2.9 g/dL (calc) (ref 1.9–3.7)
Glucose, Bld: 87 mg/dL (ref 65–99)
Potassium: 4.5 mmol/L (ref 3.5–5.3)
Sodium: 136 mmol/L (ref 135–146)
Total Bilirubin: 0.3 mg/dL (ref 0.2–1.2)
Total Protein: 7.2 g/dL (ref 6.1–8.1)

## 2019-02-01 LAB — RPR: RPR Ser Ql: NONREACTIVE

## 2019-02-01 LAB — HIV-1 RNA QUANT-NO REFLEX-BLD
HIV 1 RNA Quant: <20 copies/mL
HIV-1 RNA Quant, Log: <1.3 Log copies/mL

## 2019-02-18 ENCOUNTER — Telehealth: Payer: Self-pay | Admitting: Family Medicine

## 2019-02-18 NOTE — Telephone Encounter (Signed)
Spoke to patient about her appointment on 9/1 @ 2:30. Patient instructed to wear a face mask for the entire appointment and no visitors are allowed with her during the visit. Patient screened for covid symptoms and denied having any

## 2019-02-19 ENCOUNTER — Telehealth (INDEPENDENT_AMBULATORY_CARE_PROVIDER_SITE_OTHER): Payer: Self-pay | Admitting: General Practice

## 2019-02-19 ENCOUNTER — Ambulatory Visit: Payer: Self-pay

## 2019-02-19 DIAGNOSIS — Z0000 Encounter for general adult medical examination without abnormal findings: Secondary | ICD-10-CM

## 2019-02-19 NOTE — Telephone Encounter (Signed)
Called patient regarding appt today that is scheduled for STD testing. Per chart review, patient was just tested 3 weeks ago. No answer- left message for her to call us back.

## 2019-02-19 NOTE — Telephone Encounter (Signed)
Patient called back into front office. Discussed she had STD testing performed already 2-3 weeks ago & they tested for the same labs we would've done today. Discussed today's appt didn't seem necessary and we can cancel it if that is okay with her. Patient verbalized understanding & agrees to cancel appt. She had no questions.

## 2019-02-26 ENCOUNTER — Encounter: Payer: Self-pay | Admitting: Family

## 2019-04-01 ENCOUNTER — Other Ambulatory Visit: Payer: Self-pay

## 2019-04-01 DIAGNOSIS — Z113 Encounter for screening for infections with a predominantly sexual mode of transmission: Secondary | ICD-10-CM

## 2019-04-01 DIAGNOSIS — B20 Human immunodeficiency virus [HIV] disease: Secondary | ICD-10-CM

## 2019-04-02 LAB — T-HELPER CELL (CD4) - (RCID CLINIC ONLY)
CD4 % Helper T Cell: 39 % (ref 33–65)
CD4 T Cell Abs: 791 /uL (ref 400–1790)

## 2019-04-03 LAB — COMPREHENSIVE METABOLIC PANEL
AG Ratio: 1.4 (calc) (ref 1.0–2.5)
ALT: 12 U/L (ref 6–29)
AST: 12 U/L (ref 10–30)
Albumin: 4.1 g/dL (ref 3.6–5.1)
Alkaline phosphatase (APISO): 60 U/L (ref 31–125)
BUN: 13 mg/dL (ref 7–25)
CO2: 23 mmol/L (ref 20–32)
Calcium: 9.6 mg/dL (ref 8.6–10.2)
Chloride: 105 mmol/L (ref 98–110)
Creat: 0.96 mg/dL (ref 0.50–1.10)
Globulin: 3 g/dL (calc) (ref 1.9–3.7)
Glucose, Bld: 91 mg/dL (ref 65–99)
Potassium: 4.4 mmol/L (ref 3.5–5.3)
Sodium: 137 mmol/L (ref 135–146)
Total Bilirubin: 0.3 mg/dL (ref 0.2–1.2)
Total Protein: 7.1 g/dL (ref 6.1–8.1)

## 2019-04-03 LAB — CBC
HCT: 39.8 % (ref 35.0–45.0)
Hemoglobin: 13.2 g/dL (ref 11.7–15.5)
MCH: 30.3 pg (ref 27.0–33.0)
MCHC: 33.2 g/dL (ref 32.0–36.0)
MCV: 91.5 fL (ref 80.0–100.0)
MPV: 10.9 fL (ref 7.5–12.5)
Platelets: 351 10*3/uL (ref 140–400)
RBC: 4.35 10*6/uL (ref 3.80–5.10)
RDW: 13.4 % (ref 11.0–15.0)
WBC: 5.7 10*3/uL (ref 3.8–10.8)

## 2019-04-03 LAB — RPR: RPR Ser Ql: NONREACTIVE

## 2019-04-03 LAB — HIV-1 RNA QUANT-NO REFLEX-BLD
HIV 1 RNA Quant: <20 copies/mL
HIV-1 RNA Quant, Log: <1.3 Log copies/mL

## 2019-04-15 ENCOUNTER — Other Ambulatory Visit: Payer: Self-pay

## 2019-04-15 ENCOUNTER — Ambulatory Visit (INDEPENDENT_AMBULATORY_CARE_PROVIDER_SITE_OTHER): Payer: Self-pay | Admitting: Family

## 2019-04-15 ENCOUNTER — Encounter: Payer: Self-pay | Admitting: Family

## 2019-04-15 VITALS — BP 109/74 | HR 88 | Temp 98.8°F | Wt 257.0 lb

## 2019-04-15 DIAGNOSIS — Z0000 Encounter for general adult medical examination without abnormal findings: Secondary | ICD-10-CM

## 2019-04-15 DIAGNOSIS — B20 Human immunodeficiency virus [HIV] disease: Secondary | ICD-10-CM

## 2019-04-15 DIAGNOSIS — Z79899 Other long term (current) drug therapy: Secondary | ICD-10-CM

## 2019-04-15 MED ORDER — PREZCOBIX 800-150 MG PO TABS: 1.0000 | ORAL_TABLET | Freq: Every day | ORAL | 5 refills | Status: DC

## 2019-04-15 MED ORDER — ABACAVIR SULFATE-LAMIVUDINE 600-300 MG PO TABS: 1.0000 | ORAL_TABLET | Freq: Every day | ORAL | 5 refills | Status: DC

## 2019-04-15 NOTE — Assessment & Plan Note (Signed)
Ms. Borchard has well-controlled HIV disease with good adherence and tolerance to her ART regimen of Prezcobix and Epzicom.  She has no signs/symptoms of opportunistic infection or progressive HIV disease.  She has no problems obtaining her medication from the pharmacy.  Reviewed her lab work and plan of care with questions answered.  Continue current dose of Epzicom and Prezcobix.  Plan for follow-up in 4 months or sooner if needed with lab work 1 to 2 weeks prior to appointment.

## 2019-04-15 NOTE — Assessment & Plan Note (Signed)
   Declines vaccinations today.  Discussed importance of safe sexual practice to reduce risk of acquisition/transmission of STI.  Declines condoms.  Discussed nutritional intake and setting small attainable goals with resources provided in after visit summary.

## 2019-04-15 NOTE — Patient Instructions (Addendum)
Nice to see you.  Check out [email or web address]  Reading recommendations -  Maria Nicholson "The Obesity Code"  Continue to take your Epzicom and Prezcobix.  Refills will be sent to the pharmacy.  We will plan for follow up in 4 months or sooner if needed.   Have a great day and stay safe!

## 2019-04-15 NOTE — Progress Notes (Signed)
Subjective:    Patient ID: Maria Nicholson, adult    DOB: 12-18-1985, 33 y.o.   MRN: [id number]  Chief Complaint  Patient presents with  . HIV Positive/AIDS     HPI:  Maria Nicholson is a 33 y.o. adult with HIV disease who was last seen in the office on 01/29/2019 with good adherence and tolerance to her ART regimen of Prezcobix and Epzicom.  Viral load at the time was undetectable with CD4 count of 638.  Most recent blood work completed on 04/01/2019 with viral load that remains undetectable and CD4 count of 791.  RPR remains nonreactive.  Kidney function, liver function, electrolytes within normal ranges.  Healthcare maintenance due includes influenza vaccination, second dose of of Pneumovax, and Menveo.  Maria Nicholson continues to take her Prezcobix and Epzicom as prescribed with no adverse side effects or missed doses.  Overall feeling well today although expresses frustration about inability to lose weight and has continued to gain weight over time. Denies fevers, chills, night sweats, headaches, changes in vision, neck pain/stiffness, nausea, diarrhea, vomiting, lesions or rashes.  Maria Nicholson remains covered through [employer] which is up to date and has no problems obtaining her medication from the pharmacy.  Denies feelings of being down, depressed, or hopeless recently.  Not currently sexually active and has no new partners.  Denies any recreational or illicit drug use, tobacco use, or alcohol consumption.  She is working on improving her nutritional intake and working on exercising regularly.   Allergies  Allergen Reactions  . Amoxicillin Hives    Did it involve swelling of the face/tongue/throat, SOB, or low BP? No Did it involve sudden or severe rash/hives, skin peeling, or any reaction on the inside of your mouth or nose? Yes Did you need to seek medical attention at a hospital or doctor's office? Yes When did it last happen?33 yrs old If all above answers are "NO", may  proceed with cephalosporin use.   [name] Flavor Swelling      Outpatient Medications Prior to Visit  Medication Sig Dispense Refill  . ferrous sulfate 325 (65 FE) MG tablet Take 1 tablet (325 mg total) by mouth daily. 30 tablet 0  . abacavir-lamiVUDine (EPZICOM) 600-300 MG tablet Take 1 tablet by mouth daily. 30 tablet 5  . darunavir-cobicistat (PREZCOBIX) 800-150 MG tablet Take 1 tablet by mouth daily with breakfast. Swallow whole. Do NOT crush, break or chew tablets. Take with food. 30 tablet 5   No facility-administered medications prior to visit.      Past Medical History:  Diagnosis Date  . Asthma   . Bronchitis   . HIV (human immunodeficiency virus infection) ([place])   . No pertinent past medical history      Past Surgical History:  Procedure Laterality Date  . DILATION AND CURETTAGE, DIAGNOSTIC / THERAPEUTIC       Review of Systems  Constitutional: Negative for appetite change, chills, diaphoresis, fatigue, fever and unexpected weight change.  Eyes:       Negative for acute change in vision  Respiratory: Negative for chest tightness, shortness of breath and wheezing.   Cardiovascular: Negative for chest pain.  Gastrointestinal: Negative for diarrhea, nausea and vomiting.  Genitourinary: Negative for dysuria.  Musculoskeletal: Negative for neck pain and neck stiffness.  Skin: Negative for rash.  Neurological: Negative for seizures, syncope, weakness and headaches.  Hematological: Negative for adenopathy. Does not bruise/bleed easily.  Psychiatric/Behavioral: Negative for hallucinations.      Objective:  BP 109/74   Pulse 88   Temp 98.8 F (37.1 C)   Wt 257 lb (116.6 kg)   BMI 40.25 kg/m  Nursing note and vital signs reviewed.  Physical Exam Constitutional:      General: [name]    Appearance: She is well-developed.  Eyes:     Conjunctiva/sclera: Conjunctivae normal.  Neck:     Musculoskeletal: Neck supple.   Cardiovascular:     Rate and Rhythm: Normal rate and regular rhythm.     Heart sounds: Normal heart sounds. No murmur. No friction rub. No gallop.   Pulmonary:     Effort: Pulmonary effort is normal. No respiratory distress.     Breath sounds: Normal breath sounds. No wheezing or rales.  Chest:     Chest wall: No tenderness.  Abdominal:     General: Bowel sounds are normal.     Palpations: Abdomen is soft.     Tenderness: There is no abdominal tenderness.  Lymphadenopathy:     Cervical: No cervical adenopathy.  Skin:    General: Skin is warm and dry.     Findings: No rash.  Neurological:     Mental Status: She is alert and oriented to person, place, and time.  Psychiatric:        Behavior: Behavior normal.        Thought Content: Thought content normal.        Judgment: Judgment normal.      Depression screen [facility] 2/9 04/15/2019 01/29/2019 11/06/2017 11/18/2011 11/15/2011  Decreased Interest 0 0 3 0 1  Down, Depressed, Hopeless 0 0 3 0 1  PHQ - 2 Score 0 0 6 0 2       Assessment & Plan:    Patient Active Problem List   Diagnosis Date Noted  . Vaginal itching 01/29/2019  . Healthcare maintenance 11/27/2018  . Fibroids 11/27/2018  . Human immunodeficiency virus (HIV) disease ([place]) 11/06/2017  . Eczema 11/17/2011     Problem List Items Addressed This Visit      Other   Human immunodeficiency virus (HIV) disease ([place])    Maria Nicholson has well-controlled HIV disease with good adherence and tolerance to her ART regimen of Prezcobix and Epzicom.  She has no signs/symptoms of opportunistic infection or progressive HIV disease.  She has no problems obtaining her medication from the pharmacy.  Reviewed her lab work and plan of care with questions answered.  Continue current dose of Epzicom and Prezcobix.  Plan for follow-up in 4 months or sooner if needed with lab work 1 to 2 weeks prior to appointment.      Relevant Medications   darunavir-cobicistat (PREZCOBIX) 800-150 MG  tablet   abacavir-lamiVUDine (EPZICOM) 600-300 MG tablet   Other Relevant Orders   HIV-1 RNA quant-no reflex-bld   T-helper cell (CD4)- ([facility] clinic only)   Comprehensive metabolic panel   Healthcare maintenance     Declines vaccinations today.  Discussed importance of safe sexual practice to reduce risk of acquisition/transmission of STI.  Declines condoms.  Discussed nutritional intake and setting small attainable goals with resources provided in after visit summary.       Other Visit Diagnoses    Pharmacologic therapy    -  Primary   Relevant Orders   Lipid panel       I am having Maria Nicholson maintain her ferrous sulfate, Prezcobix, and abacavir-lamiVUDine.   Meds ordered this encounter  Medications  . darunavir-cobicistat (PREZCOBIX) 800-150  MG tablet    Sig: Take 1 tablet by mouth daily with breakfast. Swallow whole. Do NOT crush, break or chew tablets. Take with food.    Dispense:  30 tablet    Refill:  5    Order Specific Question:   Supervising Provider    Answer:   Carlyle Basques [[id number]]  . abacavir-lamiVUDine (EPZICOM) 600-300 MG tablet    Sig: Take 1 tablet by mouth daily.    Dispense:  30 tablet    Refill:  5    Order Specific Question:   Supervising Provider    Answer:   Carlyle Basques [[id number]]     Follow-up: Return in about 4 months (around 08/16/2019), or if symptoms worsen or fail to improve.   Terri Piedra, MSN, FNP-C Nurse Practitioner [facility] [place] number: [phone]

## 2019-04-28 ENCOUNTER — Encounter (HOSPITAL_COMMUNITY): Payer: Self-pay

## 2019-04-28 ENCOUNTER — Emergency Department (HOSPITAL_COMMUNITY)
Admission: EM | Admit: 2019-04-28 | Discharge: 2019-04-28 | Disposition: A | Discharge status: Home / Self Care | Payer: Self-pay | Attending: Emergency Medicine | Admitting: Emergency Medicine

## 2019-04-28 ENCOUNTER — Other Ambulatory Visit: Payer: Self-pay

## 2019-04-28 DIAGNOSIS — B20 Human immunodeficiency virus [HIV] disease: Secondary | ICD-10-CM | POA: Insufficient documentation

## 2019-04-28 DIAGNOSIS — J0140 Acute pansinusitis, unspecified: Secondary | ICD-10-CM | POA: Insufficient documentation

## 2019-04-28 DIAGNOSIS — Z79899 Other long term (current) drug therapy: Secondary | ICD-10-CM | POA: Insufficient documentation

## 2019-04-28 DIAGNOSIS — F17210 Nicotine dependence, cigarettes, uncomplicated: Secondary | ICD-10-CM | POA: Insufficient documentation

## 2019-04-28 DIAGNOSIS — J45909 Unspecified asthma, uncomplicated: Secondary | ICD-10-CM | POA: Insufficient documentation

## 2019-04-28 MED ORDER — FLUTICASONE PROPIONATE 50 MCG/ACT NA SUSP: 2.0000 | Freq: Every day | NASAL | 2 refills | Status: DC

## 2019-04-28 MED ORDER — DOXYCYCLINE HYCLATE 100 MG PO CAPS: 100.0000 mg | ORAL_CAPSULE | Freq: Two times a day (BID) | ORAL | 0 refills | Status: DC

## 2019-04-28 NOTE — ED Triage Notes (Signed)
Patient complains of right ear drainage and now funny feeling with congestion, NAD

## 2019-04-28 NOTE — Discharge Instructions (Addendum)
Take doxycycline until all gone. Flonase daily. Allergy medication daily. Try sinus rinses, see attachment. Follow up or return as needed.

## 2019-04-28 NOTE — ED Provider Notes (Signed)
[facility] EMERGENCY DEPARTMENT Provider Note   CSN: [id number] Arrival date & time: 04/28/19  1543     History   Chief Complaint No chief complaint on file.   HPI Maria Nicholson is a 33 y.o. adult.     HPI Maria Nicholson is a 33 y.o. adult with history of HIV infection, asthma, presents to emergency department with complaint of sinus pain, pressure, yellow thick drainage.  Patient denies any fever.  No cough or shortness of breath.  States symptoms have been going on for about 4 days.  She states today she started feeling pain especially over the right side of the face around the eye and maxillary sinus.  Face is tender.  She states nothing is making her symptoms better or worse.  She states pain radiates into the right ear.  She tried over-the-counter eardrops which did not help.  No sick contacts.  Past Medical History:  Diagnosis Date  . Asthma   . Bronchitis   . HIV (human immunodeficiency virus infection) ([place])   . No pertinent past medical history     Patient Active Problem List   Diagnosis Date Noted  . Vaginal itching 01/29/2019  . Healthcare maintenance 11/27/2018  . Fibroids 11/27/2018  . Human immunodeficiency virus (HIV) disease ([place]) 11/06/2017  . Eczema 11/17/2011    Past Surgical History:  Procedure Laterality Date  . DILATION AND CURETTAGE, DIAGNOSTIC / THERAPEUTIC       OB History    Gravida  3   Para  0   Term      Preterm      AB  3   Living  0     SAB  2   TAB  1   Ectopic      Multiple      Live Births               Home Medications    Prior to Admission medications   Medication Sig Start Date End Date Taking? Authorizing Provider  abacavir-lamiVUDine (EPZICOM) 600-300 MG tablet Take 1 tablet by mouth daily. 04/15/19   Golden Circle, FNP  darunavir-cobicistat (PREZCOBIX) 800-150 MG tablet Take 1 tablet by mouth daily with breakfast. Swallow whole. Do NOT crush, break or chew tablets. Take with  food. 04/15/19   Golden Circle, FNP  doxycycline (VIBRAMYCIN) 100 MG capsule Take 1 capsule (100 mg total) by mouth 2 (two) times daily. 04/28/19   Vincentina Sollers, Lahoma Rocker, PA-C  ferrous sulfate 325 (65 FE) MG tablet Take 1 tablet (325 mg total) by mouth daily. 10/07/18   Larene Pickett, PA-C  fluticasone (FLONASE) 50 MCG/ACT nasal spray Place 2 sprays into both nostrils daily. 04/28/19   Jeannett Senior, PA-C    Family History Family History  Problem Relation Age of Onset  . Cancer Maternal Grandmother        throat  . Cancer Father 63       spinal   . Cancer Maternal Aunt 62       throat    Social History Social History   Tobacco Use  . Smoking status: Current Some Day Smoker    Packs/day: 0.50    Years: 2.00    Pack years: 1.00    Types: Cigarettes  . Smokeless tobacco: Never Used  . Tobacco comment: decreased from one pack daily     Substance Use Topics  . Alcohol use: Yes    Comment: Occasionally   . Drug use:  Not Currently    Types: Marijuana     Allergies   Amoxicillin and Coconut flavor   Review of Systems Review of Systems  Constitutional: Negative for chills and fever.  HENT: Positive for congestion, ear pain, postnasal drip, sinus pressure and sinus pain. Negative for facial swelling and sore throat.   Respiratory: Negative for cough, chest tightness and shortness of breath.   Cardiovascular: Negative for chest pain, palpitations and leg swelling.  Gastrointestinal: Negative for abdominal pain, diarrhea, nausea and vomiting.  Genitourinary: Negative for dysuria and flank pain.  Musculoskeletal: Negative for arthralgias, myalgias, neck pain and neck stiffness.  Skin: Negative for rash.  Neurological: Positive for headaches. Negative for dizziness and weakness.  All other systems reviewed and are negative.    Physical Exam Updated Vital Signs BP 128/78 (BP Location: Left Arm)   Pulse 86   Temp 98.7 F (37.1 C) (Oral)   Resp 14   SpO2 100%    Physical Exam Vitals signs and nursing note reviewed.  Constitutional:      General: [name]    Appearance: She is well-developed.  HENT:     Head: Normocephalic and atraumatic.     Right Ear: Tympanic membrane, ear canal and external ear normal.     Left Ear: Tympanic membrane, ear canal and external ear normal.     Nose: Mucosal edema and rhinorrhea present.     Right Sinus: Maxillary sinus tenderness and frontal sinus tenderness present.     Left Sinus: No maxillary sinus tenderness or frontal sinus tenderness.     Mouth/Throat:     Pharynx: Uvula midline. Posterior oropharyngeal erythema present. No oropharyngeal exudate.     Tonsils: No tonsillar abscesses.  Eyes:     Conjunctiva/sclera: Conjunctivae normal.  Neck:     Musculoskeletal: Neck supple.  Cardiovascular:     Rate and Rhythm: Normal rate and regular rhythm.     Heart sounds: Normal heart sounds.  Pulmonary:     Effort: Pulmonary effort is normal. No respiratory distress.     Breath sounds: Normal breath sounds. No wheezing or rales.  Abdominal:     General: There is no distension.  Musculoskeletal: Normal range of motion.  Skin:    General: Skin is warm and dry.  Neurological:     Mental Status: She is alert and oriented to person, place, and time.      ED Treatments / Results  Labs (all labs ordered are listed, but only abnormal results are displayed) Labs Reviewed - No data to display  EKG None  Radiology No results found.  Procedures Procedures (including critical care time)  Medications Ordered in ED Medications - No data to display   Initial Impression / Assessment and Plan / ED Course  I have reviewed the triage vital signs and the nursing notes.  Pertinent labs & imaging results that were available during my care of the patient were reviewed by me and considered in my medical decision making (see chart for details).        Patient with pain, pressure,  postnasal drainage, symptoms for 4 days.  No fever or chills.  Normal vital signs.  Patient is immunocompromise.  I will start her on doxycycline for sinus infection.  We discussed Flonase and saline rinses.  She will also start taking allergy medication daily.  I will have him follow-up with PCP.  Return precautions discussed.  Given no other symptoms or sick contacts, doubt this is  COVID-19 infection.  We did discuss signs and symptoms that should prompt her return.   Vitals:   04/28/19 1552  BP: 128/78  Pulse: 86  Resp: 14  Temp: 98.7 F (37.1 C)  TempSrc: Oral  SpO2: 100%      Final Clinical Impressions(s) / ED Diagnoses   Final diagnoses:  Acute non-recurrent pansinusitis    ED Discharge Orders         Ordered    doxycycline (VIBRAMYCIN) 100 MG capsule  2 times daily     04/28/19 1740    fluticasone (FLONASE) 50 MCG/ACT nasal spray  Daily     04/28/19 1741           Jeannett Senior, PA-C 04/28/19 1745    Davonna Belling, MD 04/29/19 2325

## 2019-07-05 ENCOUNTER — Ambulatory Visit (HOSPITAL_COMMUNITY)
Admission: EM | Admit: 2019-07-05 | Discharge: 2019-07-05 | Disposition: A | Discharge status: Home / Self Care | Payer: HRSA Program | Attending: Physician Assistant | Admitting: Physician Assistant

## 2019-07-05 ENCOUNTER — Other Ambulatory Visit: Payer: Self-pay

## 2019-07-05 ENCOUNTER — Encounter (HOSPITAL_COMMUNITY): Payer: Self-pay

## 2019-07-05 DIAGNOSIS — R0981 Nasal congestion: Secondary | ICD-10-CM | POA: Diagnosis not present

## 2019-07-05 DIAGNOSIS — J309 Allergic rhinitis, unspecified: Secondary | ICD-10-CM | POA: Insufficient documentation

## 2019-07-05 DIAGNOSIS — Z3202 Encounter for pregnancy test, result negative: Secondary | ICD-10-CM

## 2019-07-05 DIAGNOSIS — H5789 Other specified disorders of eye and adnexa: Secondary | ICD-10-CM | POA: Insufficient documentation

## 2019-07-05 DIAGNOSIS — R300 Dysuria: Secondary | ICD-10-CM | POA: Diagnosis not present

## 2019-07-05 DIAGNOSIS — L0100 Impetigo, unspecified: Secondary | ICD-10-CM | POA: Insufficient documentation

## 2019-07-05 DIAGNOSIS — Z20822 Contact with and (suspected) exposure to covid-19: Secondary | ICD-10-CM | POA: Insufficient documentation

## 2019-07-05 DIAGNOSIS — Z79899 Other long term (current) drug therapy: Secondary | ICD-10-CM | POA: Insufficient documentation

## 2019-07-05 DIAGNOSIS — F17210 Nicotine dependence, cigarettes, uncomplicated: Secondary | ICD-10-CM | POA: Diagnosis not present

## 2019-07-05 DIAGNOSIS — R350 Frequency of micturition: Secondary | ICD-10-CM | POA: Insufficient documentation

## 2019-07-05 DIAGNOSIS — Z21 Asymptomatic human immunodeficiency virus [HIV] infection status: Secondary | ICD-10-CM | POA: Diagnosis not present

## 2019-07-05 LAB — POCT URINALYSIS DIP (DEVICE)
Bilirubin Urine: NEGATIVE
Glucose, UA: NEGATIVE mg/dL
Hgb urine dipstick: NEGATIVE
Ketones, ur: NEGATIVE mg/dL
Leukocytes,Ua: NEGATIVE
Nitrite: NEGATIVE
Protein, ur: NEGATIVE mg/dL
Specific Gravity, Urine: >=1.03 (ref 1.005–1.030)
Urobilinogen, UA: 0.2 mg/dL (ref 0.0–1.0)
pH: 5.5 (ref 5.0–8.0)

## 2019-07-05 LAB — POCT PREGNANCY, URINE: Preg Test, Ur: NEGATIVE

## 2019-07-05 LAB — POC URINE PREG, ED: Preg Test, Ur: NEGATIVE

## 2019-07-05 MED ORDER — MUPIROCIN CALCIUM 2 % EX CREA: 1.0000 "application " | TOPICAL_CREAM | Freq: Two times a day (BID) | CUTANEOUS | 0 refills | Status: DC

## 2019-07-05 MED ORDER — CETIRIZINE HCL 10 MG PO TABS: 10.0000 mg | ORAL_TABLET | Freq: Every day | ORAL | 0 refills | Status: DC

## 2019-07-05 NOTE — ED Triage Notes (Addendum)
Pt states h/o sinus infections has been tx with abx (augmentin) and using nasal saline. Now c/o increase nasal irritation, dryness, scab in right nares. Still coughing up yellow sputum in morning scratchy throat. Increase fatigue. Denies chills, fevers, HA or body aches. Denies abd pain, but states frequency and urgency with urination, denies hematuria.

## 2019-07-05 NOTE — Discharge Instructions (Addendum)
Apply the bactroban to your right nostril area and left ear 2 times a day for 1 week.  You may also utilize petroleum jelly/vaseline to lubricate your nostrils if they begin to dry out.  Begin taking zyrtec daily.   You may continue to utilize the nasal saline as much as you like.   Following completion of antibiotic ointment, begin using flonase again.   Your urine showed that you appear to be dehydrated, please drink plenty of water  If your Covid-19 test is positive, you will receive a phone call from [facility] regarding your results. Negative test results are not called. Both positive and negative results area always visible on MyChart. If you do not have a MyChart account, sign up instructions are in your discharge papers.   Persons who are directed to care for themselves at home may discontinue isolation under the following conditions:   At least 10 days have passed since symptom onset and  At least 24 hours have passed without running a fever (this means without the use of fever-reducing medications) and  Other symptoms have improved.  Persons infected with COVID-19 who never develop symptoms may discontinue isolation and other precautions 10 days after the date of their first positive COVID-19 test.

## 2019-07-05 NOTE — ED Provider Notes (Addendum)
[place]    CSN: [id number] Arrival date & time: 07/05/19  1410      History   Chief Complaint Chief Complaint  Patient presents with  . Dysuria  . Nasal Congestion    HPI Maria Nicholson is a 34 y.o. adult.   Patient reports to urgent care today for chronic symptoms of runny nose, itchy eyes and nasal discharge. She reports have recurrent sinus issues over the years. She was most recently treated for a sinus infection in 04/2019 with doxycycline. She was placed on flonase and nasal saline spray to continue until follow up. She notes since that time she continues to have occasional clear nasal discharge and itchy eyes. She endorses this seems to start her sinus infections. She now primarily is having issues with dry scabbing irritated nasal passages. She has continued to use the nasal saline but has stopped the flonase for some time. She denies facial pain or purulent discharge.  She does endorse waking up in the morning and having to cough up yellow sputum but this only occurs first thing in the morning. She denies all other symptoms such as frequent cough, fever , chills, shortness of breath, nausea, vomiting, diarrhea.   She also has reported increased urinary frequency and some urgency. She denies dysuria.      Past Medical History:  Diagnosis Date  . Asthma   . Bronchitis   . HIV (human immunodeficiency virus infection) ([place])   . No pertinent past medical history     Patient Active Problem List   Diagnosis Date Noted  . Vaginal itching 01/29/2019  . Healthcare maintenance 11/27/2018  . Fibroids 11/27/2018  . Human immunodeficiency virus (HIV) disease ([place]) 11/06/2017  . Eczema 11/17/2011    Past Surgical History:  Procedure Laterality Date  . DILATION AND CURETTAGE, DIAGNOSTIC / THERAPEUTIC      OB History    Gravida  3   Para  0   Term      Preterm      AB  3   Living  0     SAB  2   TAB  1   Ectopic      Multiple      Live  Births               Home Medications    Prior to Admission medications   Medication Sig Start Date End Date Taking? Authorizing Provider  abacavir-lamiVUDine (EPZICOM) 600-300 MG tablet Take 1 tablet by mouth daily. 04/15/19   Golden Circle, FNP  cetirizine (ZYRTEC ALLERGY) 10 MG tablet Take 1 tablet (10 mg total) by mouth daily. 07/05/19 08/04/19  Shelvie Salsberry, Marguerita Beards, PA-C  darunavir-cobicistat (PREZCOBIX) 800-150 MG tablet Take 1 tablet by mouth daily with breakfast. Swallow whole. Do NOT crush, break or chew tablets. Take with food. 04/15/19   Golden Circle, FNP  ferrous sulfate 325 (65 FE) MG tablet Take 1 tablet (325 mg total) by mouth daily. 10/07/18   Larene Pickett, PA-C  fluticasone (FLONASE) 50 MCG/ACT nasal spray Place 2 sprays into both nostrils daily. 04/28/19   Kirichenko, Lahoma Rocker, PA-C  mupirocin cream (BACTROBAN) 2 % Apply 1 application topically 2 (two) times daily. 07/05/19   Ahmari Duerson, Marguerita Beards, PA-C    Family History Family History  Problem Relation Age of Onset  . Cancer Maternal Grandmother        throat  . Cancer Father 70       spinal   .  Cancer Maternal Aunt 62       throat    Social History Social History   Tobacco Use  . Smoking status: Current Some Day Smoker    Packs/day: 0.50    Years: 2.00    Pack years: 1.00    Types: Cigarettes  . Smokeless tobacco: Never Used  . Tobacco comment: decreased from one pack daily     Substance Use Topics  . Alcohol use: Yes    Comment: Occasionally   . Drug use: Not Currently    Types: Marijuana     Allergies   Amoxicillin and Coconut flavor   Review of Systems Review of Systems  Constitutional: Negative for chills and fever.  HENT: Positive for congestion, postnasal drip, rhinorrhea and sneezing. Negative for ear discharge, ear pain, facial swelling, sinus pressure, sinus pain and sore throat.   Eyes: Negative for pain and visual disturbance.  Respiratory: Negative for cough and shortness of breath.     Cardiovascular: Negative for chest pain and palpitations.  Gastrointestinal: Negative for abdominal pain, diarrhea, nausea and vomiting.  Genitourinary: Positive for frequency and urgency. Negative for dysuria and hematuria.  Musculoskeletal: Negative for arthralgias, back pain and myalgias.  Skin: Negative for color change and rash.  Neurological: Negative for seizures and syncope.  All other systems reviewed and are negative.    Physical Exam Triage Vital Signs ED Triage Vitals  Enc Vitals Group     BP 07/05/19 1529 113/76     Pulse Rate 07/05/19 1529 82     Resp 07/05/19 1529 18     Temp 07/05/19 1529 98.1 F (36.7 C)     Temp Source 07/05/19 1529 Oral     SpO2 07/05/19 1529 100 %     Weight --      Height --      Head Circumference --      Peak Flow --      Pain Score 07/05/19 1527 0     Pain Loc --      Pain Edu? --      Excl. in [place]? --    No data found.  Updated Vital Signs BP 113/76 (BP Location: Left Arm)   Pulse 82   Temp 98.1 F (36.7 C) (Oral)   Resp 18   LMP 06/18/2019 (Approximate)   SpO2 100%   Visual Acuity Right Eye Distance:   Left Eye Distance:   Bilateral Distance:    Right Eye Near:   Left Eye Near:    Bilateral Near:     Physical Exam Vitals and nursing note reviewed.  Constitutional:      General: [name]    Appearance: She is well-developed. She is obese. She is not ill-appearing.  HENT:     Head: Normocephalic and atraumatic.     Right Ear: Tympanic membrane normal.     Left Ear: Tympanic membrane normal.     Ears:     Comments: Left ear with scabbing rash at the tragus    Nose: Congestion and rhinorrhea present.     Comments: Scabbing rash consistent with impetigo at right nare    Mouth/Throat:     Mouth: Mucous membranes are moist.     Pharynx: Oropharynx is clear.  Eyes:     General: No scleral icterus.    Conjunctiva/sclera: Conjunctivae normal.     Pupils: Pupils are equal, round, and reactive to  light.  Cardiovascular:     Rate and Rhythm:  Normal rate and regular rhythm.     Heart sounds: No murmur.  Pulmonary:     Effort: Pulmonary effort is normal. No respiratory distress.     Breath sounds: Normal breath sounds.  Abdominal:     Palpations: Abdomen is soft.     Tenderness: There is no abdominal tenderness.  Musculoskeletal:     Cervical back: Neck supple.     Right lower leg: No edema.     Left lower leg: No edema.  Skin:    General: Skin is warm and dry.     Capillary Refill: Capillary refill takes less than 2 seconds.  Neurological:     General: No focal deficit present.     Mental Status: She is alert and oriented to person, place, and time.  Psychiatric:        Mood and Affect: Mood normal.        Behavior: Behavior normal.        Thought Content: Thought content normal.        Judgment: Judgment normal.      UC Treatments / Results  Labs (all labs ordered are listed, but only abnormal results are displayed) Labs Reviewed  NOVEL CORONAVIRUS, NAA (HOSP ORDER, SEND-OUT TO REF LAB; TAT 18-24 HRS)  POCT URINALYSIS DIP (DEVICE)  POCT PREGNANCY, URINE  POC URINE PREG, ED    EKG   Radiology No results found.  Procedures Procedures (including critical care time)  Medications Ordered in UC Medications - No data to display  Initial Impression / Assessment and Plan / UC Course  I have reviewed the triage vital signs and the nursing notes.  Pertinent labs & imaging results that were available during my care of the patient were reviewed by me and considered in my medical decision making (see chart for details).     #Allergic Rhinitis #impetigo - Allergic rhinitis seems to cause a cycle of sinus related infections. She is not currently on an allergy medication or flonase. Will start on these to control chronic symptoms. She is not currently having sinusitis like symptoms.  - Appears to have developed impetigo 2/2 to frequent clearing of nose and blowing  nose. Likely irritation caused on ear as well. Treating with bactroban.  - start flonase following completion of bactroban and utilize petroleum jelly for nasal lubrication in future. - Covid pcr sent, unlikely due to chronicity of issues.   - UA was completely normal. Discussion further indicated that she believes its from dehydration. Recommended increase fluid intake  Final Clinical Impressions(s) / UC Diagnoses   Final diagnoses:  Impetigo  Allergic rhinitis, unspecified seasonality, unspecified trigger     Discharge Instructions     Apply the bactroban to your right nostril area and left ear 2 times a day for 1 week.  You may also utilize petroleum jelly/vaseline to lubricate your nostrils if they begin to dry out.  Begin taking zyrtec daily.   You may continue to utilize the nasal saline as much as you like.   Following completion of antibiotic ointment, begin using flonase again.   Your urine showed that you appear to be dehydrated, please drink plenty of water  If your Covid-19 test is positive, you will receive a phone call from [facility] regarding your results. Negative test results are not called. Both positive and negative results area always visible on MyChart. If you do not have a MyChart account, sign up instructions are in your discharge papers.   Persons who are directed  to care for themselves at home may discontinue isolation under the following conditions:  . At least 10 days have passed since symptom onset and . At least 24 hours have passed without running a fever (this means without the use of fever-reducing medications) and . Other symptoms have improved.  Persons infected with COVID-19 who never develop symptoms may discontinue isolation and other precautions 10 days after the date of their first positive COVID-19 test.       ED Prescriptions    Medication Sig Dispense Auth. Provider   mupirocin cream (BACTROBAN) 2 % Apply 1 application  topically 2 (two) times daily. 15 g Deniah Saia, Marguerita Beards, PA-C   cetirizine (ZYRTEC ALLERGY) 10 MG tablet Take 1 tablet (10 mg total) by mouth daily. 30 tablet Oddis Westling, Marguerita Beards, PA-C     PDMP not reviewed this encounter.   Purnell Shoemaker, PA-C 07/05/19 1622    Anaih Brander, Marguerita Beards, PA-C 07/05/19 1622    Raja Liska, Marguerita Beards, PA-C 07/05/19 1623

## 2019-07-07 LAB — NOVEL CORONAVIRUS, NAA (HOSP ORDER, SEND-OUT TO REF LAB; TAT 18-24 HRS): SARS-CoV-2, NAA: NOT DETECTED

## 2019-08-01 ENCOUNTER — Ambulatory Visit: Payer: Self-pay

## 2019-08-01 ENCOUNTER — Other Ambulatory Visit: Payer: Self-pay

## 2019-08-01 DIAGNOSIS — Z79899 Other long term (current) drug therapy: Secondary | ICD-10-CM

## 2019-08-01 DIAGNOSIS — B20 Human immunodeficiency virus [HIV] disease: Secondary | ICD-10-CM

## 2019-08-02 ENCOUNTER — Emergency Department (HOSPITAL_COMMUNITY)
Admission: EM | Admit: 2019-08-02 | Discharge: 2019-08-02 | Disposition: A | Discharge status: Home / Self Care | Payer: Self-pay | Attending: Emergency Medicine | Admitting: Emergency Medicine

## 2019-08-02 ENCOUNTER — Other Ambulatory Visit: Payer: Self-pay

## 2019-08-02 ENCOUNTER — Encounter (HOSPITAL_COMMUNITY): Payer: Self-pay | Admitting: Emergency Medicine

## 2019-08-02 ENCOUNTER — Encounter: Payer: Self-pay | Admitting: Family

## 2019-08-02 DIAGNOSIS — F17210 Nicotine dependence, cigarettes, uncomplicated: Secondary | ICD-10-CM | POA: Insufficient documentation

## 2019-08-02 DIAGNOSIS — Z79899 Other long term (current) drug therapy: Secondary | ICD-10-CM | POA: Insufficient documentation

## 2019-08-02 DIAGNOSIS — B20 Human immunodeficiency virus [HIV] disease: Secondary | ICD-10-CM | POA: Insufficient documentation

## 2019-08-02 DIAGNOSIS — J45909 Unspecified asthma, uncomplicated: Secondary | ICD-10-CM | POA: Insufficient documentation

## 2019-08-02 DIAGNOSIS — K148 Other diseases of tongue: Secondary | ICD-10-CM | POA: Insufficient documentation

## 2019-08-02 DIAGNOSIS — Z20822 Contact with and (suspected) exposure to covid-19: Secondary | ICD-10-CM | POA: Insufficient documentation

## 2019-08-02 LAB — T-HELPER CELL (CD4) - (RCID CLINIC ONLY)
CD4 % Helper T Cell: 37 % (ref 33–65)
CD4 T Cell Abs: 852 /uL (ref 400–1790)

## 2019-08-02 LAB — SARS CORONAVIRUS 2 (TAT 6-24 HRS): SARS Coronavirus 2: NEGATIVE

## 2019-08-02 LAB — GROUP A STREP BY PCR: Group A Strep by PCR: NOT DETECTED

## 2019-08-02 MED ORDER — LIDOCAINE VISCOUS HCL 2 % MT SOLN
15.0000 mL | Freq: Once | OROMUCOSAL | Status: AC
  Administered 2019-08-02: 04:00:00 15 mL via ORAL
  Filled 2019-08-02: qty 15

## 2019-08-02 MED ORDER — SUCRALFATE 1 G PO TABS: 1.0000 g | ORAL_TABLET | Freq: Once | ORAL | Status: DC

## 2019-08-02 MED ORDER — ALUM & MAG HYDROXIDE-SIMETH 200-200-20 MG/5ML PO SUSP
30.0000 mL | Freq: Once | ORAL | Status: AC
  Administered 2019-08-02: 30 mL via ORAL
  Filled 2019-08-02: qty 30

## 2019-08-02 MED ORDER — NYSTATIN 100000 UNIT/ML MT SUSP: 500000.0000 [IU] | Freq: Four times a day (QID) | OROMUCOSAL | 0 refills | Status: DC

## 2019-08-02 NOTE — Discharge Instructions (Addendum)
Person Under Monitoring Name: Maria Nicholson  Location: [address] [place] [address]   Infection Prevention Recommendations for Individuals Confirmed to have, or Being Evaluated for, 2019 Novel Coronavirus (COVID-19) Infection Who Receive Care at Home  Individuals who are confirmed to have, or are being evaluated for, COVID-19 should follow the prevention steps below until a healthcare provider or local or state health department says they can return to normal activities.  Stay home except to get medical care You should restrict activities outside your home, except for getting medical care. Do not go to work, school, or public areas, and do not use public transportation or taxis.  Call ahead before visiting your doctor Before your medical appointment, call the healthcare provider and tell them that you have, or are being evaluated for, COVID-19 infection. This will help the healthcare provider's office take steps to keep other people from getting infected. Ask your healthcare provider to call the local or state health department.  Monitor your symptoms Seek prompt medical attention if your illness is worsening (e.g., difficulty breathing). Before going to your medical appointment, call the healthcare provider and tell them that you have, or are being evaluated for, COVID-19 infection. Ask your healthcare provider to call the local or state health department.  Wear a facemask You should wear a facemask that covers your nose and mouth when you are in the same room with other people and when you visit a healthcare provider. People who live with or visit you should also wear a facemask while they are in the same room with you.  Separate yourself from other people in your home As much as possible, you should stay in a different room from other people in your home. Also, you should use a separate bathroom, if available.  Avoid sharing household items You  should not share dishes, drinking glasses, cups, eating utensils, towels, bedding, or other items with other people in your home. After using these items, you should wash them thoroughly with soap and water.  Cover your coughs and sneezes Cover your mouth and nose with a tissue when you cough or sneeze, or you can cough or sneeze into your sleeve. Throw used tissues in a lined trash can, and immediately wash your hands with soap and water for at least 20 seconds or use an alcohol-based hand rub.  Wash your [facility] your hands often and thoroughly with soap and water for at least 20 seconds. You can use an alcohol-based hand sanitizer if soap and water are not available and if your hands are not visibly dirty. Avoid touching your eyes, nose, and mouth with unwashed hands.   Prevention Steps for Caregivers and Household Members of Individuals Confirmed to have, or Being Evaluated for, COVID-19 Infection Being Cared for in the Home  If you live with, or provide care at home for, a person confirmed to have, or being evaluated for, COVID-19 infection please follow these guidelines to prevent infection:  Follow healthcare provider's instructions Make sure that you understand and can help the patient follow any healthcare provider instructions for all care.  Provide for the patient's basic needs You should help the patient with basic needs in the home and provide support for getting groceries, prescriptions, and other personal needs.  Monitor the patient's symptoms If they are getting sicker, call his or her medical provider and tell them that the patient has, or is being evaluated for, COVID-19 infection. This will help the  healthcare provider's office take steps to keep other people from getting infected. Ask the healthcare provider to call the local or state health department.  Limit the number of people who have contact with the patient If possible, have only one caregiver for the  patient. Other household members should stay in another home or place of residence. If this is not possible, they should stay in another room, or be separated from the patient as much as possible. Use a separate bathroom, if available. Restrict visitors who do not have an essential need to be in the home.  Keep older adults, very young children, and other sick people away from the patient Keep older adults, very young children, and those who have compromised immune systems or chronic health conditions away from the patient. This includes people with chronic heart, lung, or kidney conditions, diabetes, and cancer.  Ensure good ventilation Make sure that shared spaces in the home have good air flow, such as from an air conditioner or an opened window, weather permitting.  Wash your hands often Wash your hands often and thoroughly with soap and water for at least 20 seconds. You can use an alcohol based hand sanitizer if soap and water are not available and if your hands are not visibly dirty. Avoid touching your eyes, nose, and mouth with unwashed hands. Use disposable paper towels to dry your hands. If not available, use dedicated cloth towels and replace them when they become wet.  Wear a facemask and gloves Wear a disposable facemask at all times in the room and gloves when you touch or have contact with the patient's blood, body fluids, and/or secretions or excretions, such as sweat, saliva, sputum, nasal mucus, vomit, urine, or feces.  Ensure the mask fits over your nose and mouth tightly, and do not touch it during use. Throw out disposable facemasks and gloves after using them. Do not reuse. Wash your hands immediately after removing your facemask and gloves. If your personal clothing becomes contaminated, carefully remove clothing and launder. Wash your hands after handling contaminated clothing. Place all used disposable facemasks, gloves, and other waste in a lined container before  disposing them with other household waste. Remove gloves and wash your hands immediately after handling these items.  Do not share dishes, glasses, or other household items with the patient Avoid sharing household items. You should not share dishes, drinking glasses, cups, eating utensils, towels, bedding, or other items with a patient who is confirmed to have, or being evaluated for, COVID-19 infection. After the person uses these items, you should wash them thoroughly with soap and water.  Wash laundry thoroughly Immediately remove and wash clothes or bedding that have blood, body fluids, and/or secretions or excretions, such as sweat, saliva, sputum, nasal mucus, vomit, urine, or feces, on them. Wear gloves when handling laundry from the patient. Read and follow directions on labels of laundry or clothing items and detergent. In general, wash and dry with the warmest temperatures recommended on the label.  Clean all areas the individual has used often Clean all touchable surfaces, such as counters, tabletops, doorknobs, bathroom fixtures, toilets, phones, keyboards, tablets, and bedside tables, every day. Also, clean any surfaces that may have blood, body fluids, and/or secretions or excretions on them. Wear gloves when cleaning surfaces the patient has come in contact with. Use a diluted bleach solution (e.g., dilute bleach with 1 part bleach and 10 parts water) or a household disinfectant with a label that says EPA-registered for coronaviruses. To  make a bleach solution at home, add 1 tablespoon of bleach to 1 quart (4 cups) of water. For a larger supply, add  cup of bleach to 1 gallon (16 cups) of water. Read labels of cleaning products and follow recommendations provided on product labels. Labels contain instructions for safe and effective use of the cleaning product including precautions you should take when applying the product, such as wearing gloves or eye protection and making sure you  have good ventilation during use of the product. Remove gloves and wash hands immediately after cleaning.  Monitor yourself for signs and symptoms of illness Caregivers and household members are considered close contacts, should monitor their health, and will be asked to limit movement outside of the home to the extent possible. Follow the monitoring steps for close contacts listed on the symptom monitoring form.   ? If you have additional questions, contact your local health department or call the epidemiologist on call at [phone] (available 24/7). ? This guidance is subject to change. For the most up-to-date guidance from [facility], please refer to their website: [email or web address]

## 2019-08-02 NOTE — ED Provider Notes (Signed)
[facility] EMERGENCY DEPARTMENT Provider Note   CSN: [id number] Arrival date & time: 08/02/19  [id number]     History Chief Complaint  Patient presents with  . Sore Throat    Maria Nicholson is a 34 y.o. adult.  The history is provided by the patient.  Sore Throat This is a new problem. The current episode started more than 1 week ago. The problem occurs constantly. The problem has not changed since onset.Pertinent negatives include no chest pain, no abdominal pain, no headaches and no shortness of breath. Nothing aggravates the symptoms. Nothing relieves the symptoms. She has tried nothing for the symptoms. The treatment provided no relief.  Small tear on the left posterior tongue.  No f/c/r.  No exudate.  No lip lesions.  No trauma that she recalls.  No h/o herpes nor cold sores.       Past Medical History:  Diagnosis Date  . Asthma   . Bronchitis   . HIV (human immunodeficiency virus infection) ([place])   . No pertinent past medical history     Patient Active Problem List   Diagnosis Date Noted  . Vaginal itching 01/29/2019  . Healthcare maintenance 11/27/2018  . Fibroids 11/27/2018  . Human immunodeficiency virus (HIV) disease ([place]) 11/06/2017  . Eczema 11/17/2011    Past Surgical History:  Procedure Laterality Date  . DILATION AND CURETTAGE, DIAGNOSTIC / THERAPEUTIC       OB History    Gravida  3   Para  0   Term      Preterm      AB  3   Living  0     SAB  2   TAB  1   Ectopic      Multiple      Live Births              Family History  Problem Relation Age of Onset  . Cancer Maternal Grandmother        throat  . Cancer Father 64       spinal   . Cancer Maternal Aunt 62       throat    Social History   Tobacco Use  . Smoking status: Current Some Day Smoker    Packs/day: 0.50    Years: 2.00    Pack years: 1.00    Types: Cigarettes  . Smokeless tobacco: Never Used  . Tobacco comment: decreased from one pack  daily     Substance Use Topics  . Alcohol use: Yes    Comment: Occasionally   . Drug use: Not Currently    Types: Marijuana    Home Medications Prior to Admission medications   Medication Sig Start Date End Date Taking? Authorizing Provider  abacavir-lamiVUDine (EPZICOM) 600-300 MG tablet Take 1 tablet by mouth daily. 04/15/19   Golden Circle, FNP  cetirizine (ZYRTEC ALLERGY) 10 MG tablet Take 1 tablet (10 mg total) by mouth daily. 07/05/19 08/04/19  Darr, Marguerita Beards, PA-C  darunavir-cobicistat (PREZCOBIX) 800-150 MG tablet Take 1 tablet by mouth daily with breakfast. Swallow whole. Do NOT crush, break or chew tablets. Take with food. 04/15/19   Golden Circle, FNP  ferrous sulfate 325 (65 FE) MG tablet Take 1 tablet (325 mg total) by mouth daily. 10/07/18   Larene Pickett, PA-C  fluticasone (FLONASE) 50 MCG/ACT nasal spray Place 2 sprays into both nostrils daily. 04/28/19   Kirichenko, Lahoma Rocker, PA-C  mupirocin cream (BACTROBAN) 2 % Apply 1  application topically 2 (two) times daily. 07/05/19   Darr, Marguerita Beards, PA-C    Allergies    Amoxicillin and Coconut flavor  Review of Systems   Review of Systems  Constitutional: Negative for fever.  HENT: Negative for congestion, drooling, facial swelling, hearing loss, trouble swallowing and voice change.   Eyes: Negative for visual disturbance.  Respiratory: Negative for shortness of breath.   Cardiovascular: Negative for chest pain.  Gastrointestinal: Negative for abdominal pain.  Genitourinary: Negative for difficulty urinating and genital sores.  Musculoskeletal: Negative for arthralgias.  Skin: Negative for rash.  Neurological: Negative for headaches.  Psychiatric/Behavioral: Negative for agitation.  All other systems reviewed and are negative.   Physical Exam Updated Vital Signs BP 122/83 (BP Location: Right Arm)   Pulse 80   Temp 98.3 F (36.8 C) (Oral)   Resp 16   LMP 07/12/2019   SpO2 100%   Physical Exam Vitals and  nursing note reviewed.  Constitutional:      General: [name]    Appearance: She is normal weight.  HENT:     Head: Normocephalic and atraumatic.     Nose: Nose normal.     Mouth/Throat:     Mouth: Mucous membranes are moist.     Pharynx: Oropharynx is clear. No oropharyngeal exudate.     Comments: Small tear left lateral posterior tongue 50mm no swelling Eyes:     Pupils: Pupils are equal, round, and reactive to light.  Cardiovascular:     Rate and Rhythm: Normal rate and regular rhythm.     Pulses: Normal pulses.     Heart sounds: Normal heart sounds.  Pulmonary:     Effort: Pulmonary effort is normal.     Breath sounds: Normal breath sounds.  Abdominal:     General: Abdomen is flat. Bowel sounds are normal.     Tenderness: There is no abdominal tenderness. There is no guarding or rebound.  Musculoskeletal:        General: Normal range of motion.     Cervical back: Normal range of motion and neck supple.  Lymphadenopathy:     Cervical: No cervical adenopathy.  Skin:    General: Skin is warm and dry.     Capillary Refill: Capillary refill takes less than 2 seconds.  Neurological:     General: No focal deficit present.     Mental Status: She is alert and oriented to person, place, and time.     Deep Tendon Reflexes: Reflexes normal.  Psychiatric:        Mood and Affect: Mood normal.        Behavior: Behavior normal.     ED Results / Procedures / Treatments   Labs (all labs ordered are listed, but only abnormal results are displayed) Labs Reviewed  GROUP A STREP BY PCR  SARS CORONAVIRUS 2 (TAT 6-24 HRS)    EKG None  Radiology No results found.  Procedures Procedures (including critical care time)  Medications Ordered in ED Medications  alum & mag hydroxide-simeth (MAALOX/MYLANTA) 200-200-20 MG/5ML suspension 30 mL (30 mLs Oral Given 08/02/19 0342)    And  lidocaine (XYLOCAINE) 2 % viscous mouth solution 15 mL (15 mLs Oral Given 08/02/19  0342)    ED Course  I have reviewed the triage vital signs and the nursing notes.  Pertinent labs & imaging results that were available during my care of the patient were reviewed by me and considered in my medical decision making (  see chart for details).    MDM Rules/Calculators/A&P                      Will start nystatin and I recommend tylenol.  It does not appear to be an ulcer it is more of a cut.It is certainly not an aphthous ulcer.  There is no LAN.  There are no other skin lesions.  It is not infected.  If symptoms persist it will need a biopsy.  I will send covid as covid can given unusual manifestations.  Home quarantine instructions given.  Patient is amenable to this plan.    Maria Nicholson was evaluated in Emergency Department on 08/02/2019 for the symptoms described in the history of present illness. She was evaluated in the context of the global COVID-19 pandemic, which necessitated consideration that the patient might be at risk for infection with the SARS-CoV-2 virus that causes COVID-19. Institutional protocols and algorithms that pertain to the evaluation of patients at risk for COVID-19 are in a state of rapid change based on information released by regulatory bodies including the CDC and federal and state organizations. These policies and algorithms were followed during the patient's care in the ED.  Final Clinical Impression(s) / ED Diagnoses Return for weakness, numbness, changes in vision or speech, fevers >100.4 unrelieved by medication, shortness of breath, intractable vomiting, or diarrhea, abdominal pain, Inability to tolerate liquids or food, cough, altered mental status or any concerns. No signs of systemic illness or infection. The patient is nontoxic-appearing on exam and vital signs are within normal limits.   I have reviewed the triage vital signs and the nursing notes. Pertinent labs &imaging results that were available during my care of the patient were  reviewed by me and considered in my medical decision making (see chart for details).  After history, exam, and medical workup I feel the patient has been appropriately medically screened and is safe for discharge home. Pertinent diagnoses were discussed with the patient. Patient was given return precautions   Marcellius Montagna, MD 08/02/19 [id number]

## 2019-08-02 NOTE — ED Triage Notes (Signed)
Patient reports left sore throat for 1 1/2 weeks with mild swelling , no fever or chills , no cough /respirations unlabored .

## 2019-08-05 LAB — COMPREHENSIVE METABOLIC PANEL
AG Ratio: 1.4 (calc) (ref 1.0–2.5)
ALT: 11 U/L (ref 6–29)
AST: 12 U/L (ref 10–30)
Albumin: 4.2 g/dL (ref 3.6–5.1)
Alkaline phosphatase (APISO): 62 U/L (ref 31–125)
BUN: 14 mg/dL (ref 7–25)
CO2: 26 mmol/L (ref 20–32)
Calcium: 9.7 mg/dL (ref 8.6–10.2)
Chloride: 105 mmol/L (ref 98–110)
Creat: 0.88 mg/dL (ref 0.50–1.10)
Globulin: 3 g/dL (calc) (ref 1.9–3.7)
Glucose, Bld: 86 mg/dL (ref 65–99)
Potassium: 3.9 mmol/L (ref 3.5–5.3)
Sodium: 138 mmol/L (ref 135–146)
Total Bilirubin: 0.3 mg/dL (ref 0.2–1.2)
Total Protein: 7.2 g/dL (ref 6.1–8.1)

## 2019-08-05 LAB — LIPID PANEL
Cholesterol: 161 mg/dL (ref <200)
HDL: 51 mg/dL (ref >=50)
LDL Cholesterol (Calc): 94 mg/dL (calc)
Non-HDL Cholesterol (Calc): 110 mg/dL (calc) (ref <130)
Total CHOL/HDL Ratio: 3.2 (calc) (ref <5.0)
Triglycerides: 70 mg/dL (ref <150)

## 2019-08-05 LAB — HIV-1 RNA QUANT-NO REFLEX-BLD
HIV 1 RNA Quant: <20 copies/mL
HIV-1 RNA Quant, Log: <1.3 Log copies/mL

## 2019-08-14 ENCOUNTER — Telehealth: Payer: Self-pay

## 2019-08-14 NOTE — Telephone Encounter (Signed)
COVID-19 Pre-Screening Questions:08/14/19  Do you currently have a fever (>100 F), chills or unexplained body aches?NO   Are you currently experiencing new cough, shortness of breath, sore throat, runny nose? NO .  Have you recently travelled outside the state of [place] in the last 14 days? NO  .  Have you been in contact with someone that is currently pending confirmation of Covid19 testing or has been confirmed to have the [place] virus?  NO   **If the patient answers NO to ALL questions -  advise the patient to please call the clinic before coming to the office should any symptoms develop.

## 2019-08-15 ENCOUNTER — Ambulatory Visit (INDEPENDENT_AMBULATORY_CARE_PROVIDER_SITE_OTHER): Payer: Self-pay | Admitting: Family

## 2019-08-15 ENCOUNTER — Encounter: Payer: Self-pay | Admitting: Family

## 2019-08-15 ENCOUNTER — Other Ambulatory Visit: Payer: Self-pay

## 2019-08-15 VITALS — BP 107/73 | HR 86 | Temp 98.5°F | Ht 66.0 in | Wt 255.0 lb

## 2019-08-15 DIAGNOSIS — Z0000 Encounter for general adult medical examination without abnormal findings: Secondary | ICD-10-CM

## 2019-08-15 DIAGNOSIS — B20 Human immunodeficiency virus [HIV] disease: Secondary | ICD-10-CM

## 2019-08-15 MED ORDER — ABACAVIR SULFATE-LAMIVUDINE 600-300 MG PO TABS: 1.0000 | ORAL_TABLET | Freq: Every day | ORAL | 5 refills | Status: DC

## 2019-08-15 MED ORDER — PREZCOBIX 800-150 MG PO TABS: 1.0000 | ORAL_TABLET | Freq: Every day | ORAL | 5 refills | Status: DC

## 2019-08-15 NOTE — Progress Notes (Signed)
Subjective:    Patient ID: Maria Nicholson, adult    DOB: [birth year], 34 y.o.   MRN: [id number]  Chief Complaint  Patient presents with  . Follow-up     HPI:  Maria Nicholson is a 34 y.o. adult with HIV disease who was last seen in the office on 04/15/2019 with good adherence and tolerance to her ART regimen of Prezcobix and Epzicom.  Viral load at the time was undetectable with CD4 count of 791.  Most recent blood work on 08/01/2019 with a viral load that remains undetectable and CD4 count of 852.  Healthcare maintenance due includes second dose of Pneumovax, tetanus, and Menveo.  Maria Nicholson continues to take her Epzicom and Prezcobix as prescribed with no adverse side effects or missed doses.  Overall feeling well today with no new concerns/complaints.  She has mentioned she is attempting to lose weight and has lost less than 1 pound through changing her diet. Denies fevers, chills, night sweats, headaches, changes in vision, neck pain/stiffness, nausea, diarrhea, vomiting, lesions or rashes.  Maria Nicholson remains covered through [employer] and has no problems obtaining her medication from the pharmacy.  Denies feelings of being down, depressed, or hopeless recently.  She has excellent insight into her current situation.  No recreational or illicit drug use.  She does smoke approximately one half a pack of cigarettes per day and uses alcohol on occasion.  She is sexually active and using condoms.   Allergies  Allergen Reactions  . Amoxicillin Hives    Did it involve swelling of the face/tongue/throat, SOB, or low BP? No Did it involve sudden or severe rash/hives, skin peeling, or any reaction on the inside of your mouth or nose? Yes Did you need to seek medical attention at a hospital or doctor's office? Yes When did it last happen?34 yrs old If all above answers are "NO", may proceed with cephalosporin use.   [name] Flavor Swelling      Outpatient Medications Prior to Visit   Medication Sig Dispense Refill  . ferrous sulfate 325 (65 FE) MG tablet Take 1 tablet (325 mg total) by mouth daily. 30 tablet 0  . fluticasone (FLONASE) 50 MCG/ACT nasal spray Place 2 sprays into both nostrils daily. 16 mL 2  . abacavir-lamiVUDine (EPZICOM) 600-300 MG tablet Take 1 tablet by mouth daily. 30 tablet 5  . darunavir-cobicistat (PREZCOBIX) 800-150 MG tablet Take 1 tablet by mouth daily with breakfast. Swallow whole. Do NOT crush, break or chew tablets. Take with food. 30 tablet 5  . cetirizine (ZYRTEC ALLERGY) 10 MG tablet Take 1 tablet (10 mg total) by mouth daily. 30 tablet 0  . mupirocin cream (BACTROBAN) 2 % Apply 1 application topically 2 (two) times daily. (Patient not taking: Reported on 08/15/2019) 15 g 0  . nystatin (MYCOSTATIN) 100000 UNIT/ML suspension Take 5 mLs (500,000 Units total) by mouth 4 (four) times daily. (Patient not taking: Reported on 08/15/2019) 60 mL 0   No facility-administered medications prior to visit.     Past Medical History:  Diagnosis Date  . Asthma   . Bronchitis   . HIV (human immunodeficiency virus infection) ([place])   . No pertinent past medical history      Past Surgical History:  Procedure Laterality Date  . DILATION AND CURETTAGE, DIAGNOSTIC / THERAPEUTIC         Review of Systems  Constitutional: Negative for appetite change, chills, diaphoresis, fatigue, fever and unexpected weight change.  Eyes:  Negative for acute change in vision  Respiratory: Negative for chest tightness, shortness of breath and wheezing.   Cardiovascular: Negative for chest pain.  Gastrointestinal: Negative for diarrhea, nausea and vomiting.  Genitourinary: Negative for dysuria.  Musculoskeletal: Negative for neck pain and neck stiffness.  Skin: Negative for rash.  Neurological: Negative for seizures, syncope, weakness and headaches.  Hematological: Negative for adenopathy. Does not bruise/bleed easily.  Psychiatric/Behavioral: Negative for  hallucinations.      Objective:    BP 107/73   Pulse 86   Temp 98.5 F (36.9 C)   Ht 5\' 6"  (1.676 m)   Wt 255 lb (115.7 kg)   SpO2 98%   BMI 41.16 kg/m  Nursing note and vital signs reviewed.  Physical Exam Constitutional:      General: [name]    Appearance: She is well-developed.  Eyes:     Conjunctiva/sclera: Conjunctivae normal.  Cardiovascular:     Rate and Rhythm: Normal rate and regular rhythm.     Heart sounds: Normal heart sounds. No murmur. No friction rub. No gallop.   Pulmonary:     Effort: Pulmonary effort is normal. No respiratory distress.     Breath sounds: Normal breath sounds. No wheezing or rales.  Chest:     Chest wall: No tenderness.  Abdominal:     General: Bowel sounds are normal.     Palpations: Abdomen is soft.     Tenderness: There is no abdominal tenderness.  Musculoskeletal:     Cervical back: Neck supple.  Lymphadenopathy:     Cervical: No cervical adenopathy.  Skin:    General: Skin is warm and dry.     Findings: No rash.  Neurological:     Mental Status: She is alert and oriented to person, place, and time.  Psychiatric:        Behavior: Behavior normal.        Thought Content: Thought content normal.        Judgment: Judgment normal.      Depression screen [facility] 2/9 04/15/2019 01/29/2019 11/06/2017 11/18/2011 11/15/2011  Decreased Interest 0 0 3 0 1  Down, Depressed, Hopeless 0 0 3 0 1  PHQ - 2 Score 0 0 6 0 2       Assessment & Plan:    Patient Active Problem List   Diagnosis Date Noted  . Vaginal itching 01/29/2019  . Healthcare maintenance 11/27/2018  . Fibroids 11/27/2018  . Human immunodeficiency virus (HIV) disease ([place]) 11/06/2017  . Eczema 11/17/2011     Problem List Items Addressed This Visit      Other   Human immunodeficiency virus (HIV) disease ([place])   Relevant Medications   abacavir-lamiVUDine (EPZICOM) 600-300 MG tablet   darunavir-cobicistat (PREZCOBIX) 800-150 MG tablet        I have discontinued Maria Nicholson's mupirocin cream and nystatin. I am also having her maintain her ferrous sulfate, fluticasone, cetirizine, abacavir-lamiVUDine, and Prezcobix.   Meds ordered this encounter  Medications  . abacavir-lamiVUDine (EPZICOM) 600-300 MG tablet    Sig: Take 1 tablet by mouth daily.    Dispense:  30 tablet    Refill:  5    Order Specific Question:   Supervising Provider    Answer:   Carlyle Basques [[id number]]  . darunavir-cobicistat (PREZCOBIX) 800-150 MG tablet    Sig: Take 1 tablet by mouth daily with breakfast. Swallow whole. Do NOT crush, break or chew tablets. Take with food.    Dispense:  30 tablet    Refill:  5    Order Specific Question:   Supervising Provider    Answer:   Carlyle Basques [[id number]]     Follow-up: Return in about 4 months (around 12/13/2019), or if symptoms worsen or fail to improve.   Terri Piedra, MSN, FNP-C Nurse Practitioner [facility] [place] number: [phone]

## 2019-08-15 NOTE — Assessment & Plan Note (Addendum)
   Due for vaccinations at next office visit  Discussed importance of safe sexual practice to reduce risk of STI.  Condoms declined.  Discussed basic information on weight management and resources provided in after visit summary.

## 2019-08-15 NOTE — Patient Instructions (Addendum)
Nice to see you.  Continue your Prezcobix and Epizicom daily.  Refills will be sent to the pharmacy.   Check out [facility]  [email or web address]  Also check out   [email or web address]  Plan for follow up in 4 months or sooner if needed with lab work 1-2 weeks prior to your appointment.  Have a great day and stay safe!

## 2019-08-15 NOTE — Assessment & Plan Note (Signed)
Maria Nicholson continues to have well-controlled HIV disease with her current ART regimen of Prezcobix and Epzicom.  No signs/symptoms of opportunistic infection or progressive HIV disease.  We reviewed her lab work and discussed the plan of care.  Financial assistance is up-to-date and will need to be renewed in July.  Continue current dose of Prezcobix and Epzicom.  We briefly discussed the possibility of long-acting injectable medications which she may be interested in with information provided in after visit summary.  Plan for follow-up in 4 months or sooner if needed with lab work 1 to 2 weeks prior to appointment.

## 2019-09-02 ENCOUNTER — Telehealth: Payer: Self-pay

## 2019-09-02 ENCOUNTER — Encounter (HOSPITAL_COMMUNITY): Payer: Self-pay | Admitting: Emergency Medicine

## 2019-09-02 ENCOUNTER — Other Ambulatory Visit: Payer: Self-pay

## 2019-09-02 ENCOUNTER — Ambulatory Visit (HOSPITAL_COMMUNITY)
Admission: EM | Admit: 2019-09-02 | Discharge: 2019-09-02 | Disposition: A | Discharge status: Home / Self Care | Payer: Self-pay | Attending: Family Medicine | Admitting: Family Medicine

## 2019-09-02 DIAGNOSIS — K1379 Other lesions of oral mucosa: Secondary | ICD-10-CM

## 2019-09-02 MED ORDER — CLOTRIMAZOLE 10 MG MT TROC: 10.0000 mg | Freq: Every day | OROMUCOSAL | 0 refills | Status: DC

## 2019-09-02 NOTE — ED Triage Notes (Addendum)
Patient has had thrush in the past 08/02/2019  Patient thinks she has this again.  Patient reports an area of discoloration on left side of back of tongue/throat as described by patient.  Patient has runny nose, and congested cough with phlegm

## 2019-09-02 NOTE — Telephone Encounter (Signed)
Patient called office today stating she is having discomfort on left side of tongue. States a month ago she was seen at ED for [facility]. States that 2-3 days ago she noticed a red bump on the back of tongue. Occasionally there is some discomfort/throbbing pain. Denies any issues swallowing.  Is concerned that this could be related to thrust. Would like to know what she should do for this and if FNP would prescribe something to help. Does not have a PCP at this time. Encouraged patient to establish care with PCP. [place]

## 2019-09-02 NOTE — Telephone Encounter (Signed)
It appears she may have had a small cut on the side of her tongue and was treated for thrush. She would need to be seen in order to determine what exactly is going on. Thanks!

## 2019-09-04 NOTE — ED Provider Notes (Signed)
[facility]   [id number] 09/02/19 Arrival Time: [id number]  ASSESSMENT & PLAN:  1. Oral cavity pain     No signs of peritonsillar abscess. Possible early thrush. Discussed.  Begin: Meds ordered this encounter  Medications  . clotrimazole (MYCELEX) 10 MG troche    Sig: Take 1 tablet (10 mg total) by mouth 5 (five) times daily.    Dispense:  70 tablet    Refill:  0    Monitor symptoms. May return here if not improving as anticipated.  Reviewed expectations re: course of current medical issues. Questions answered. Outlined signs and symptoms indicating need for more acute intervention. Patient verbalized understanding. After Visit Summary given.   SUBJECTIVE:  Maria Nicholson is a 34 y.o. adult who feels she may have oral thrush. H/O in the past "and it feels like it's starting again." Describes left tongue discomfort and discoloration; whitish. Mild burning. Has had a cold recently. Nasal congestion and occasional cough. Afebrile. No specific aggravating or alleviating factors reported. No new medications. Tolerating PO intake without n/v.   OBJECTIVE:  Vitals:   09/02/19 1526  BP: 116/73  Pulse: 81  Resp: 18  Temp: 99 F (37.2 C)  TempSrc: Oral  SpO2: 98%     General appearance: alert; no distress HEENT: throat with mild erythema and cobblestoning; uvula is midline; left side of tongue with whitish discoloration when compared to the right side; left tongue is TTP; no oral ulcers or lesions Neck: supple with FROM; no lymphadenopathy CV: RRR Lungs: speaks full sentences without difficulty Abd: soft; non-tender Skin: reveals no rash; warm and dry Psychological: alert and cooperative; normal mood and affect  Allergies  Allergen Reactions  . Amoxicillin Hives    Did it involve swelling of the face/tongue/throat, SOB, or low BP? No Did it involve sudden or severe rash/hives, skin peeling, or any reaction on the inside of your mouth or nose? Yes Did  you need to seek medical attention at a hospital or doctor's office? Yes When did it last happen?34 yrs old If all above answers are "NO", may proceed with cephalosporin use.   [name] Flavor Swelling    Past Medical History:  Diagnosis Date  . Asthma   . Bronchitis   . HIV (human immunodeficiency virus infection) ([place])   . No pertinent past medical history    Social History   Socioeconomic History  . Marital status: Single    Spouse name: Not on file  . Number of children: Not on file  . Years of education: Not on file  . Highest education level: Not on file  Occupational History  . Not on file  Tobacco Use  . Smoking status: Current Some Day Smoker    Packs/day: 0.50    Years: 2.00    Pack years: 1.00    Types: Cigarettes  . Smokeless tobacco: Never Used  . Tobacco comment: decreased from one pack daily     Substance and Sexual Activity  . Alcohol use: Yes    Comment: Occasionally   . Drug use: Not Currently    Types: Marijuana  . Sexual activity: Yes    Partners: Male    Birth control/protection: None, Condom    Comment: pt. given condoms  Other Topics Concern  . Not on file  Social History Narrative  . Not on file   Social Determinants of Health   Financial Resource Strain:   . Difficulty of Paying Living Expenses:   Food Insecurity:   .  Worried About Charity fundraiser in the Last Year:   . Arboriculturist in the Last Year:   Transportation Needs:   . Film/video editor (Medical):   Marland Kitchen Lack of Transportation (Non-Medical):   Physical Activity:   . Days of Exercise per Week:   . Minutes of Exercise per Session:   Stress:   . Feeling of Stress :   Social Connections:   . Frequency of Communication with Friends and Family:   . Frequency of Social Gatherings with Friends and Family:   . Attends Religious Services:   . Active Member of Clubs or Organizations:   . Attends Archivist Meetings:   Marland Kitchen Marital Status:   Intimate  Partner Violence:   . Fear of Current or Ex-Partner:   . Emotionally Abused:   Marland Kitchen Physically Abused:   . Sexually Abused:    Family History  Problem Relation Age of Onset  . Cancer Maternal Grandmother        throat  . Cancer Father 25       spinal   . Cancer Maternal Aunt 62       throat          Vanessa Kick, MD 09/04/19 1006

## 2019-09-10 ENCOUNTER — Ambulatory Visit (INDEPENDENT_AMBULATORY_CARE_PROVIDER_SITE_OTHER): Payer: Self-pay | Admitting: Family

## 2019-09-10 ENCOUNTER — Encounter: Payer: Self-pay | Admitting: Family

## 2019-09-10 ENCOUNTER — Other Ambulatory Visit: Payer: Self-pay

## 2019-09-10 VITALS — BP 119/77 | HR 93 | Temp 97.6°F | Ht 66.0 in | Wt 261.0 lb

## 2019-09-10 DIAGNOSIS — K148 Other diseases of tongue: Secondary | ICD-10-CM | POA: Insufficient documentation

## 2019-09-10 NOTE — Progress Notes (Signed)
Subjective:    Patient ID: Maria Nicholson, adult    DOB: February 10, 1986, 34 y.o.   MRN: [id number]  Chief Complaint  Patient presents with  . Follow-up    B20     HPI:  Maria Nicholson is a 34 y.o. adult with HIV disease who was last seen in the office on 08/15/2019 with good adherence and tolerance to her ART regimen of Epzicom and Prezcobix.  Viral load at the time was undetectable with CD4 count of 852. Was recently seen at Urgent Care with a lesion on her tongue. There was concern for early thrush and she was prescribed clotrimazole.   Ms. Bosier has been having concerns about tongue lesions since January with concern for thrush. She has been using the clotrimazole as prescribed with some improvement in her symptoms. Located on the left side of her tongue near the back. Described as red with discomfort. No trauma or injury to her tongue that she is aware of. No fevers, chills, or sweats. Has been using Oral B mouthwash and a Crest multipurpose mouthwash which she uses 3 times per day.    Allergies  Allergen Reactions  . Amoxicillin Hives    Did it involve swelling of the face/tongue/throat, SOB, or low BP? No Did it involve sudden or severe rash/hives, skin peeling, or any reaction on the inside of your mouth or nose? Yes Did you need to seek medical attention at a hospital or doctor's office? Yes When did it last happen?34 yrs old If all above answers are "NO", may proceed with cephalosporin use.   [name] Flavor Swelling      Outpatient Medications Prior to Visit  Medication Sig Dispense Refill  . abacavir-lamiVUDine (EPZICOM) 600-300 MG tablet Take 1 tablet by mouth daily. 30 tablet 5  . clotrimazole (MYCELEX) 10 MG troche Take 1 tablet (10 mg total) by mouth 5 (five) times daily. 70 tablet 0  . darunavir-cobicistat (PREZCOBIX) 800-150 MG tablet Take 1 tablet by mouth daily with breakfast. Swallow whole. Do NOT crush, break or chew tablets. Take with food. 30 tablet 5   . ferrous sulfate 325 (65 FE) MG tablet Take 1 tablet (325 mg total) by mouth daily. 30 tablet 0  . fluticasone (FLONASE) 50 MCG/ACT nasal spray Place 2 sprays into both nostrils daily. 16 mL 2  . cetirizine (ZYRTEC ALLERGY) 10 MG tablet Take 1 tablet (10 mg total) by mouth daily. 30 tablet 0   No facility-administered medications prior to visit.     Past Medical History:  Diagnosis Date  . Asthma   . Bronchitis   . HIV (human immunodeficiency virus infection) ([place])   . No pertinent past medical history      Past Surgical History:  Procedure Laterality Date  . DILATION AND CURETTAGE, DIAGNOSTIC / THERAPEUTIC         Review of Systems  Constitutional: Negative for appetite change, chills, diaphoresis, fatigue, fever and unexpected weight change.  Eyes:       Negative for acute change in vision  Respiratory: Negative for chest tightness, shortness of breath and wheezing.   Cardiovascular: Negative for chest pain.  Gastrointestinal: Negative for diarrhea, nausea and vomiting.  Genitourinary: Negative for dysuria.  Musculoskeletal: Negative for neck pain and neck stiffness.  Skin: Negative for rash.  Neurological: Negative for seizures, syncope, weakness and headaches.  Hematological: Negative for adenopathy. Does not bruise/bleed easily.  Psychiatric/Behavioral: Negative for hallucinations.      Objective:    BP  119/77   Pulse 93   Temp 97.6 F (36.4 C)   Ht 5\' 6"  (1.676 m)   Wt 261 lb (118.4 kg)   LMP 08/15/2019   SpO2 98%   BMI 42.13 kg/m  Nursing note and vital signs reviewed.  Physical Exam Constitutional:      General: [name]    Appearance: She is well-developed.  HENT:     Mouth/Throat:     Comments: Tongue appears pink. There is a small area on the left side toward the posterior aspect of the tongue that appears as a cut possible small ulcer.  Eyes:     Conjunctiva/sclera: Conjunctivae normal.  Cardiovascular:     Rate and  Rhythm: Normal rate and regular rhythm.     Heart sounds: Normal heart sounds. No murmur. No friction rub. No gallop.   Pulmonary:     Effort: Pulmonary effort is normal. No respiratory distress.     Breath sounds: Normal breath sounds. No wheezing or rales.  Chest:     Chest wall: No tenderness.  Musculoskeletal:     Cervical back: Neck supple.  Lymphadenopathy:     Cervical: No cervical adenopathy.  Skin:    General: Skin is warm and dry.     Findings: No rash.  Neurological:     Mental Status: She is alert and oriented to person, place, and time.  Psychiatric:        Behavior: Behavior normal.        Thought Content: Thought content normal.        Judgment: Judgment normal.     Depression screen [facility] 2/9 04/15/2019 01/29/2019 11/06/2017 11/18/2011 11/15/2011  Decreased Interest 0 0 3 0 1  Down, Depressed, Hopeless 0 0 3 0 1  PHQ - 2 Score 0 0 6 0 2       Assessment & Plan:    Patient Active Problem List   Diagnosis Date Noted  . Tongue lesion 09/10/2019  . Vaginal itching 01/29/2019  . Healthcare maintenance 11/27/2018  . Fibroids 11/27/2018  . Human immunodeficiency virus (HIV) disease ([place]) 11/06/2017  . Eczema 11/17/2011     Problem List Items Addressed This Visit      Other   Tongue lesion - Primary    Ms. Kraynak appears to have a small tongue lesion on the left side of her posterior tongue. There is no evidence of thrush at present. This is likely being exacerbated by her multiple mouthwashes. Recommend stopping mouthwashes for the next week. If symptoms worsen or do not improve will send for further evaluation.           I am having Maria Nicholson maintain her ferrous sulfate, fluticasone, cetirizine, abacavir-lamiVUDine, Prezcobix, and clotrimazole.    Follow-up: Return if symptoms worsen or fail to improve.   Terri Piedra, MSN, FNP-C Nurse Practitioner [facility] [place] number:  [phone]

## 2019-09-10 NOTE — Patient Instructions (Signed)
Nice to see you.  Stop using the mouthwashes for a couple of weeks.  If your symptoms worsen or do not improve please let us know.  There does not appear to be any need for the clotrimazole at this time.  Let us know if you have any questions.  Have a great day and stay safe!

## 2019-09-10 NOTE — Assessment & Plan Note (Signed)
Maria Nicholson appears to have a small tongue lesion on the left side of her posterior tongue. There is no evidence of thrush at present. This is likely being exacerbated by her multiple mouthwashes. Recommend stopping mouthwashes for the next week. If symptoms worsen or do not improve will send for further evaluation.

## 2019-10-22 ENCOUNTER — Ambulatory Visit: Payer: Self-pay

## 2019-10-22 ENCOUNTER — Telehealth: Payer: Self-pay

## 2019-10-22 NOTE — Telephone Encounter (Signed)
Patient called office today stating she missed her appt with OBGYN. States office never told her that location moved. Has not rescheduled her appt yet; left vm for the RN. Patient would like to know what she should do regarding white vaginal discharge. Advised patient to follow up with Urgent Care regarding her concern. Will go today. [place]

## 2019-10-23 ENCOUNTER — Ambulatory Visit (HOSPITAL_COMMUNITY)
Admission: EM | Admit: 2019-10-23 | Discharge: 2019-10-23 | Disposition: A | Discharge status: Home / Self Care | Payer: Self-pay | Attending: Emergency Medicine | Admitting: Emergency Medicine

## 2019-10-23 ENCOUNTER — Other Ambulatory Visit: Payer: Self-pay

## 2019-10-23 ENCOUNTER — Encounter (HOSPITAL_COMMUNITY): Payer: Self-pay | Admitting: Emergency Medicine

## 2019-10-23 DIAGNOSIS — Z3202 Encounter for pregnancy test, result negative: Secondary | ICD-10-CM

## 2019-10-23 DIAGNOSIS — B20 Human immunodeficiency virus [HIV] disease: Secondary | ICD-10-CM | POA: Insufficient documentation

## 2019-10-23 DIAGNOSIS — N760 Acute vaginitis: Secondary | ICD-10-CM | POA: Insufficient documentation

## 2019-10-23 DIAGNOSIS — N898 Other specified noninflammatory disorders of vagina: Secondary | ICD-10-CM | POA: Insufficient documentation

## 2019-10-23 LAB — POC URINE PREG, ED: Preg Test, Ur: NEGATIVE

## 2019-10-23 MED ORDER — FLUCONAZOLE 150 MG PO TABS: 150.0000 mg | ORAL_TABLET | ORAL | 0 refills | Status: DC

## 2019-10-23 NOTE — ED Triage Notes (Signed)
PT reports vaginal discharge for 1 week. Suspects yeast  Infection, but wants STD screening.   Nausea in the mornings for 3 weeks with intermittent vomiting.

## 2019-10-23 NOTE — ED Provider Notes (Signed)
[place]   MRN: [id number] DOB: 1986-03-03  Subjective:   Maria Nicholson is a 34 y.o. adult presenting for 1 week history of malodorous thick white discharge. She is worried about a yeast infection. Has a hx of yeast infections. Has a hx of HIV, does get regular follow up with ID. Has sex with 61 female partner, generally uses condoms for protection but the last one broke.   No current facility-administered medications for this encounter.  Current Outpatient Medications:  .  abacavir-lamiVUDine (EPZICOM) 600-300 MG tablet, Take 1 tablet by mouth daily., Disp: 30 tablet, Rfl: 5 .  cetirizine (ZYRTEC ALLERGY) 10 MG tablet, Take 1 tablet (10 mg total) by mouth daily., Disp: 30 tablet, Rfl: 0 .  darunavir-cobicistat (PREZCOBIX) 800-150 MG tablet, Take 1 tablet by mouth daily with breakfast. Swallow whole. Do NOT crush, break or chew tablets. Take with food., Disp: 30 tablet, Rfl: 5 .  ferrous sulfate 325 (65 FE) MG tablet, Take 1 tablet (325 mg total) by mouth daily., Disp: 30 tablet, Rfl: 0 .  clotrimazole (MYCELEX) 10 MG troche, Take 1 tablet (10 mg total) by mouth 5 (five) times daily., Disp: 70 tablet, Rfl: 0 .  fluticasone (FLONASE) 50 MCG/ACT nasal spray, Place 2 sprays into both nostrils daily., Disp: 16 mL, Rfl: 2   Allergies  Allergen Reactions  . Amoxicillin Hives    Did it involve swelling of the face/tongue/throat, SOB, or low BP? No Did it involve sudden or severe rash/hives, skin peeling, or any reaction on the inside of your mouth or nose? Yes Did you need to seek medical attention at a hospital or doctor's office? Yes When did it last happen?34 yrs old If all above answers are "NO", may proceed with cephalosporin use.   [name] Flavor Swelling    Past Medical History:  Diagnosis Date  . Asthma   . Bronchitis   . HIV (human immunodeficiency virus infection) ([place])   . No pertinent past medical history      Past Surgical History:  Procedure  Laterality Date  . DILATION AND CURETTAGE, DIAGNOSTIC / THERAPEUTIC      Family History  Problem Relation Age of Onset  . Cancer Maternal Grandmother        throat  . Cancer Father 23       spinal   . Cancer Maternal Aunt 62       throat    Social History   Tobacco Use  . Smoking status: Current Some Day Smoker    Packs/day: 0.50    Years: 2.00    Pack years: 1.00    Types: Cigarettes  . Smokeless tobacco: Never Used  . Tobacco comment: decreased from one pack daily     Substance Use Topics  . Alcohol use: Yes    Comment: Occasionally   . Drug use: Not Currently    Types: Marijuana    ROS   Objective:   Vitals: BP 107/77   Pulse 79   Temp 99 F (37.2 C) (Oral)   Resp 18   LMP 10/01/2019   SpO2 98%   Physical Exam Constitutional:      General: [name]    Appearance: Normal appearance. She is well-developed and normal weight. She is not ill-appearing, toxic-appearing or diaphoretic.  HENT:     Head: Normocephalic and atraumatic.     Right Ear: External ear normal.     Left Ear: External ear normal.  Nose: Nose normal.     Mouth/Throat:     Pharynx: Oropharynx is clear.  Eyes:     General: No scleral icterus.       Right eye: No discharge.        Left eye: No discharge.     Extraocular Movements: Extraocular movements intact.     Pupils: Pupils are equal, round, and reactive to light.  Cardiovascular:     Rate and Rhythm: Normal rate.  Pulmonary:     Effort: Pulmonary effort is normal.  Musculoskeletal:     Cervical back: Normal range of motion.  Neurological:     Mental Status: She is alert and oriented to person, place, and time.  Psychiatric:        Mood and Affect: Mood normal.        Behavior: Behavior normal.        Thought Content: Thought content normal.        Judgment: Judgment normal.     Results for orders placed or performed during the hospital encounter of 10/23/19 (from the past 24 hour(s))  POC urine  pregnancy     Status: None   Collection Time: 10/23/19  1:04 PM  Result Value Ref Range   Preg Test, Ur NEGATIVE NEGATIVE    Assessment and Plan :   PDMP not reviewed this encounter.  1. Vaginal discharge   2. Acute vaginitis   3. HIV disease ([place])     Start Diflucan to cover for yeast vaginitis.  Labs pending. Counseled patient on potential for adverse effects with medications prescribed/recommended today, ER and return-to-clinic precautions discussed, patient verbalized understanding.    Jaynee Eagles, PA-C 10/23/19 1336

## 2019-10-23 NOTE — ED Triage Notes (Signed)
LMP 4/13, denies dysuria and abdominal pain

## 2019-10-26 IMAGING — CT CT ABD-PELV W/ CM
2 of 3 series · 16 of 39 positions shown, 18 images · IV contrast (APPLIED)
Comparison: Abdominal CT dated 12/24/2005

CLINICAL DATA: 31-year-old female with nausea vomiting.

EXAM:
CT ABDOMEN AND PELVIS WITH CONTRAST
TECHNIQUE: Multidetector CT imaging of the abdomen and pelvis was performed
using the standard protocol following bolus administration of
intravenous contrast.
CONTRAST:  100mL KKPPZK-O88 IOPAMIDOL (KKPPZK-O88) INJECTION 61%

[Series 5: lung bases · axial · 0.98mm/px · z∈[+1186,+1296]mm · 13 of 25 slices shown, 15 images]
[im 2/25  soft-tissue]
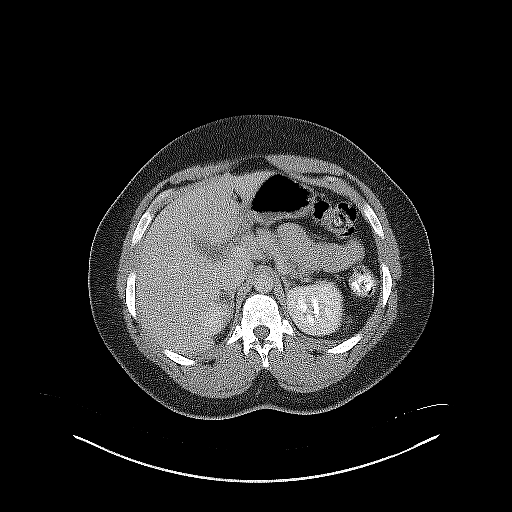
[im 2/25  bone]
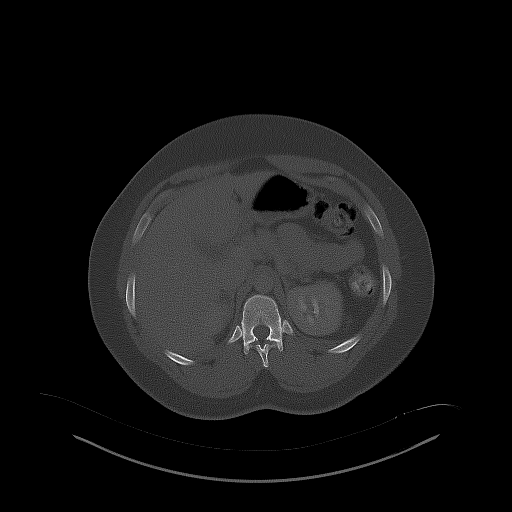
[im 4/25  soft-tissue]
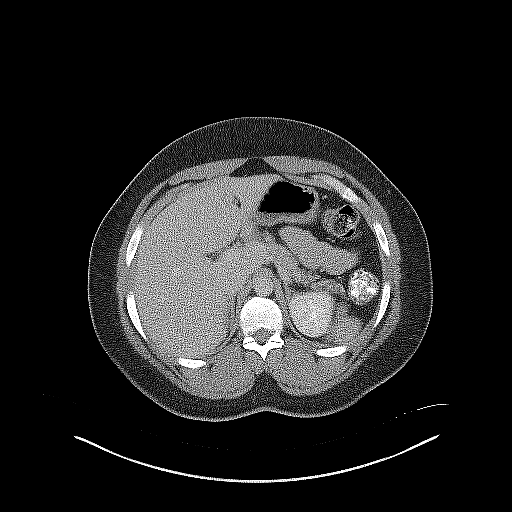
[im 6/25  soft-tissue]
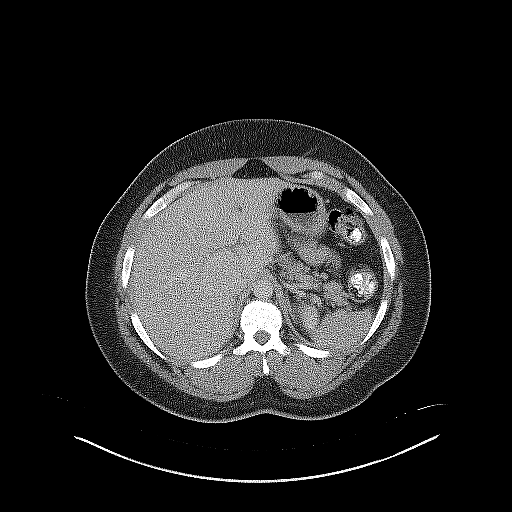
[im 8/25  soft-tissue]
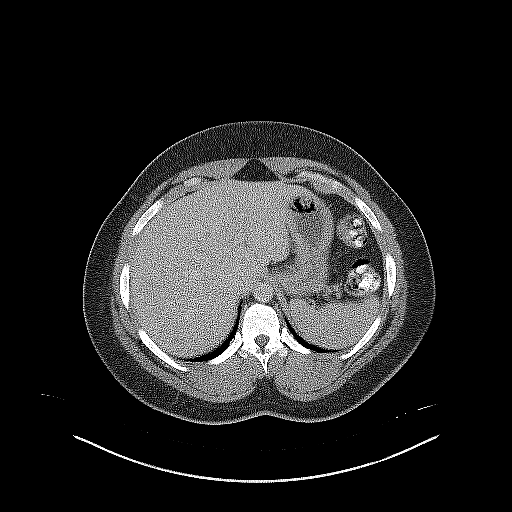
[im 9/25  soft-tissue]
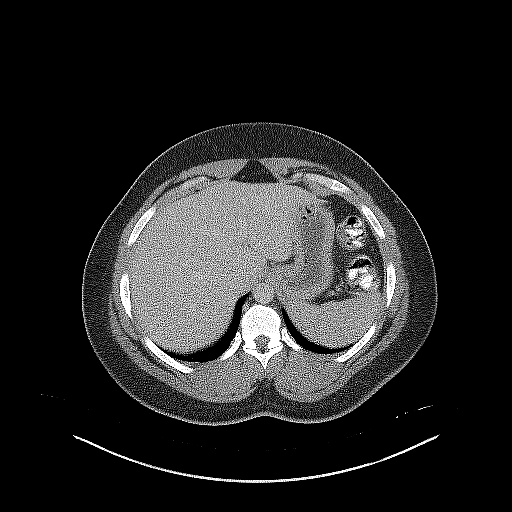
[im 11/25  soft-tissue]
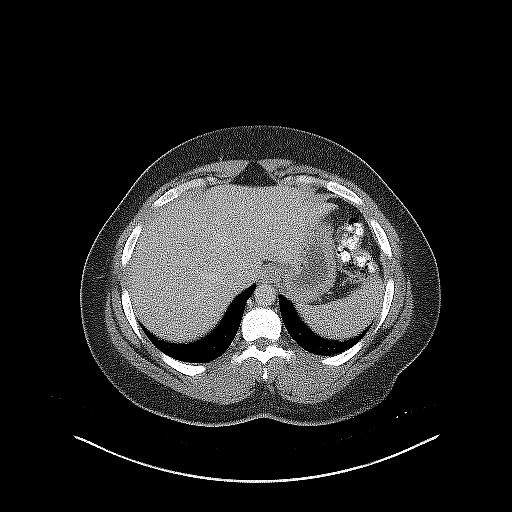
[im 13/25  soft-tissue]
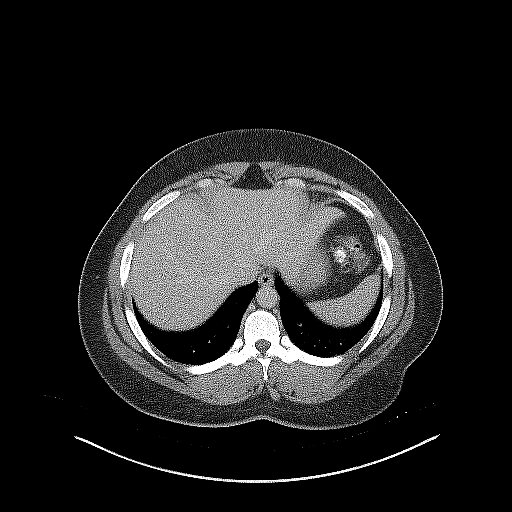
[im 15/25  soft-tissue]
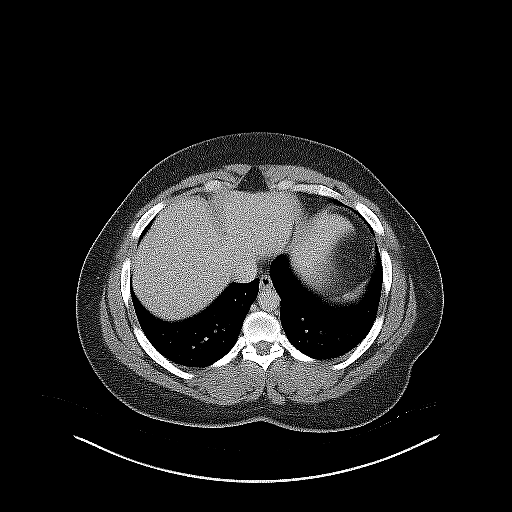
[im 17/25  soft-tissue]
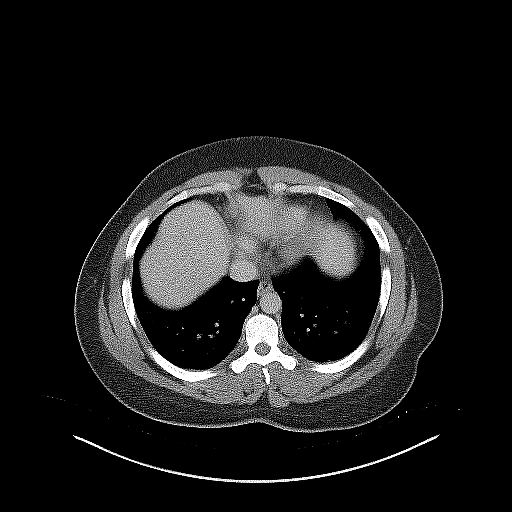
[im 17/25  bone]
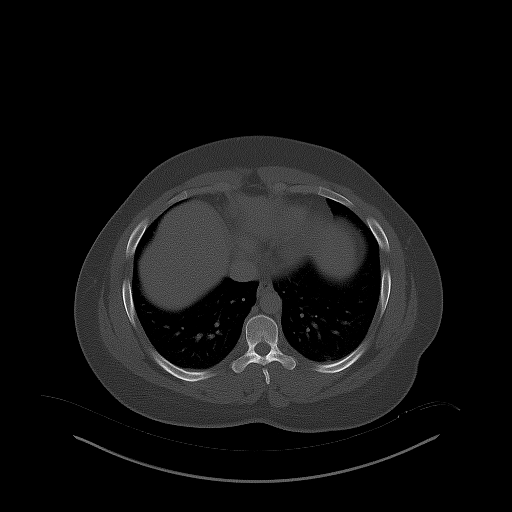
[im 18/25  soft-tissue]
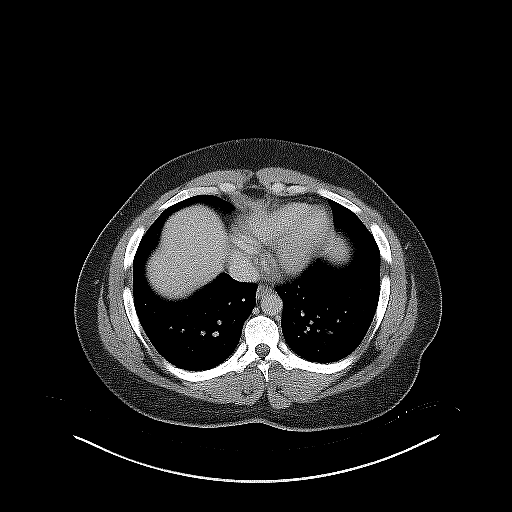
[im 20/25  soft-tissue]
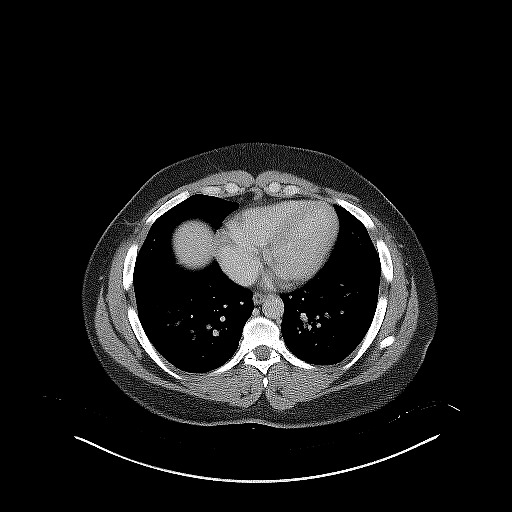
[im 22/25  soft-tissue]
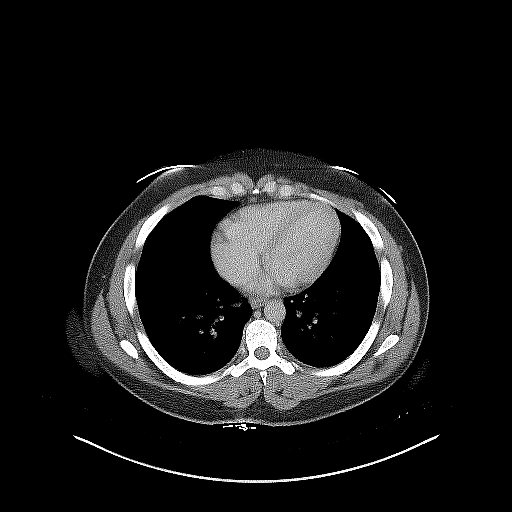
[im 24/25  soft-tissue]
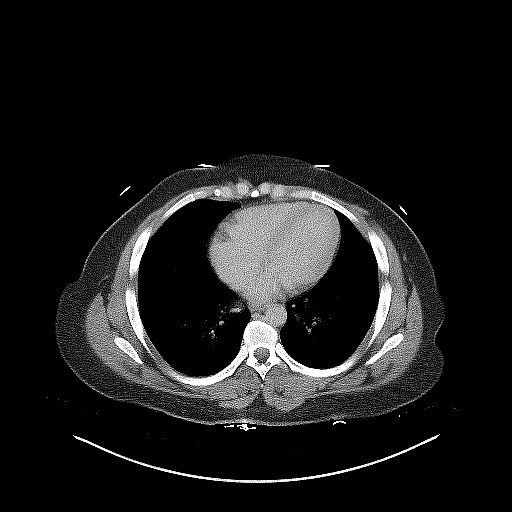

[Series 7: coronal soft tissue · coronal · 0.80mm/px · 3 of 101 slices shown]
[im 34/101  soft-tissue]
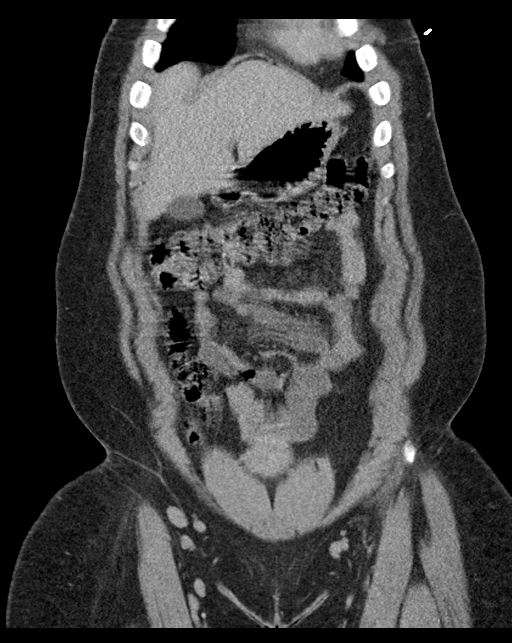
[im 45/101  soft-tissue]
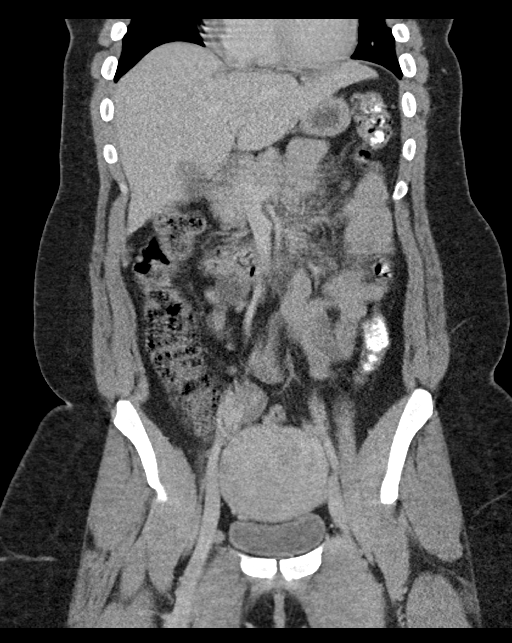
[im 56/101  soft-tissue]
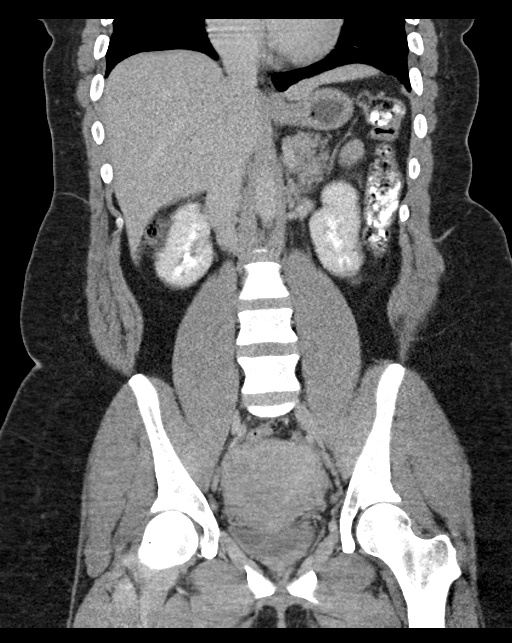

[16 of 39 positions shown; findings below may reference images not displayed]

FINDINGS: Evaluation of this exam is limited due to respiratory motion
artifact.

Lower chest: The visualized lung bases are clear.

No intra-free air.  Trace free fluid within the pelvis

Hepatobiliary: No focal liver abnormality is seen. No gallstones,
gallbladder wall thickening, or biliary dilatation.

Pancreas: Unremarkable. No pancreatic ductal dilatation or
surrounding inflammatory changes.

Spleen: Normal in size without focal abnormality.

Adrenals/Urinary Tract: Adrenal glands are unremarkable. Kidneys are
normal, without renal calculi, focal lesion, or hydronephrosis.
Bladder is unremarkable.

Stomach/Bowel: Stomach is within normal limits. Appendix appears
normal. No evidence of bowel wall thickening, distention, or
inflammatory changes.

Vascular/Lymphatic: No significant vascular findings are present. No
enlarged abdominal or pelvic lymph nodes.

Reproductive: The uterus is enlarged with multiple fibroids. The
largest fibroid measures approximately 6.3 x 7.5 cm in the right
uterine body. Pelvic ultrasound may provide better evaluation. The
right ovary is unremarkable. There is probable corpus luteum in the
right ovary. The left ovary is poorly visualized.

Other: Small fat containing umbilical hernia

Musculoskeletal: No acute or significant osseous findings.
IMPRESSION: 1. No acute intra-abdominal or pelvic pathology.
2. Myomatous uterus.

## 2019-10-28 ENCOUNTER — Telehealth (HOSPITAL_COMMUNITY): Payer: Self-pay

## 2019-10-28 LAB — CERVICOVAGINAL ANCILLARY ONLY
Bacterial Vaginitis (gardnerella): POSITIVE — AB
Candida Glabrata: NEGATIVE
Candida Vaginitis: POSITIVE — AB
Chlamydia: NEGATIVE
Comment: NEGATIVE
Comment: NEGATIVE
Comment: NEGATIVE
Comment: NEGATIVE
Comment: NEGATIVE
Comment: NORMAL
Neisseria Gonorrhea: NEGATIVE
Trichomonas: NEGATIVE

## 2019-10-28 MED ORDER — METRONIDAZOLE 500 MG PO TABS: 500.0000 mg | ORAL_TABLET | Freq: Two times a day (BID) | ORAL | 0 refills | Status: DC

## 2019-11-19 ENCOUNTER — Other Ambulatory Visit: Payer: Self-pay

## 2019-11-21 ENCOUNTER — Other Ambulatory Visit: Payer: Self-pay

## 2019-11-21 DIAGNOSIS — B20 Human immunodeficiency virus [HIV] disease: Secondary | ICD-10-CM

## 2019-11-22 LAB — T-HELPER CELL (CD4) - (RCID CLINIC ONLY)
CD4 % Helper T Cell: 38 % (ref 33–65)
CD4 T Cell Abs: 701 /uL (ref 400–1790)

## 2019-11-23 LAB — COMPREHENSIVE METABOLIC PANEL
AG Ratio: 1.3 (calc) (ref 1.0–2.5)
ALT: 9 U/L (ref 6–29)
AST: 13 U/L (ref 10–30)
Albumin: 3.8 g/dL (ref 3.6–5.1)
Alkaline phosphatase (APISO): 58 U/L (ref 31–125)
BUN: 14 mg/dL (ref 7–25)
CO2: 27 mmol/L (ref 20–32)
Calcium: 9.3 mg/dL (ref 8.6–10.2)
Chloride: 105 mmol/L (ref 98–110)
Creat: 0.97 mg/dL (ref 0.50–1.10)
Globulin: 2.9 g/dL (calc) (ref 1.9–3.7)
Glucose, Bld: 78 mg/dL (ref 65–99)
Potassium: 3.9 mmol/L (ref 3.5–5.3)
Sodium: 139 mmol/L (ref 135–146)
Total Bilirubin: 0.3 mg/dL (ref 0.2–1.2)
Total Protein: 6.7 g/dL (ref 6.1–8.1)

## 2019-11-23 LAB — HIV-1 RNA QUANT-NO REFLEX-BLD
HIV 1 RNA Quant: 30 copies/mL — ABNORMAL HIGH
HIV-1 RNA Quant, Log: 1.48 Log copies/mL — ABNORMAL HIGH

## 2019-11-27 ENCOUNTER — Telehealth: Payer: Self-pay

## 2019-11-27 ENCOUNTER — Ambulatory Visit (HOSPITAL_COMMUNITY)
Admission: EM | Admit: 2019-11-27 | Discharge: 2019-11-27 | Disposition: A | Discharge status: Home / Self Care | Payer: Self-pay | Attending: Urgent Care | Admitting: Urgent Care

## 2019-11-27 ENCOUNTER — Encounter: Payer: Self-pay | Admitting: Family

## 2019-11-27 ENCOUNTER — Other Ambulatory Visit: Payer: Self-pay

## 2019-11-27 DIAGNOSIS — N898 Other specified noninflammatory disorders of vagina: Secondary | ICD-10-CM | POA: Insufficient documentation

## 2019-11-27 LAB — POCT URINALYSIS DIP (DEVICE)
Bilirubin Urine: NEGATIVE
Glucose, UA: NEGATIVE mg/dL
Ketones, ur: NEGATIVE mg/dL
Leukocytes,Ua: NEGATIVE
Nitrite: NEGATIVE
Protein, ur: NEGATIVE mg/dL
Specific Gravity, Urine: 1.025 (ref 1.005–1.030)
Urobilinogen, UA: 0.2 mg/dL (ref 0.0–1.0)
pH: 5.5 (ref 5.0–8.0)

## 2019-11-27 LAB — POC URINE PREG, ED: Preg Test, Ur: NEGATIVE

## 2019-11-27 MED ORDER — NYSTATIN-TRIAMCINOLONE 100000-0.1 UNIT/GM-% EX CREA
TOPICAL_CREAM | CUTANEOUS | 0 refills | Status: DC
Start: 2019-11-27 — End: 2020-07-22

## 2019-11-27 NOTE — ED Triage Notes (Signed)
Pt presents today with burning with urination. Pt states that she was seen here on 5/5 and diagnosed with BV.

## 2019-11-27 NOTE — Discharge Instructions (Addendum)
Prescribing you some cream to help with vaginal irritation, itching and possible yeast infection. We will send a swab for STD screening and let she know if anything is positive on the results Follow up as needed for continued or worsening symptoms

## 2019-11-27 NOTE — ED Provider Notes (Signed)
[facility]    CSN: [id number] Arrival date & time: 11/27/19  1408      History   Chief Complaint No chief complaint on file.   HPI Maria Nicholson is a 34 y.o. female.   Pt presents with burning with urination x several days. Reports increased frequency of urination. Recently treated for BV and yeast infection. Denies abnormal vaginal discharge, pelvic pain, abdominal pain, nausea/vomiting. Menstrual cycle just ended. Concerned for STDs.   ROS per HPI      Past Medical History:  Diagnosis Date  . Asthma   . Bronchitis   . HIV (human immunodeficiency virus infection) ([place])   . No pertinent past medical history     Patient Active Problem List   Diagnosis Date Noted  . Tongue lesion 09/10/2019  . Vaginal itching 01/29/2019  . Healthcare maintenance 11/27/2018  . Fibroids 11/27/2018  . Human immunodeficiency virus (HIV) disease ([place]) 11/06/2017  . Eczema 11/17/2011    Past Surgical History:  Procedure Laterality Date  . DILATION AND CURETTAGE, DIAGNOSTIC / THERAPEUTIC      OB History    Gravida  3   Para  0   Term      Preterm      AB  3   Living  0     SAB  2   TAB  1   Ectopic      Multiple      Live Births               Home Medications    Prior to Admission medications   Medication Sig Start Date End Date Taking? Authorizing Provider  abacavir-lamiVUDine (EPZICOM) 600-300 MG tablet Take 1 tablet by mouth daily. 08/15/19  Yes Golden Circle, FNP  darunavir-cobicistat (PREZCOBIX) 800-150 MG tablet Take 1 tablet by mouth daily with breakfast. Swallow whole. Do NOT crush, break or chew tablets. Take with food. 08/15/19  Yes Golden Circle, FNP  cetirizine (ZYRTEC ALLERGY) 10 MG tablet Take 1 tablet (10 mg total) by mouth daily. 07/05/19 10/23/19  Darr, Marguerita Beards, PA-C  clotrimazole (MYCELEX) 10 MG troche Take 1 tablet (10 mg total) by mouth 5 (five) times daily. 09/02/19   Vanessa Kick, MD  ferrous sulfate 325 (65 FE) MG  tablet Take 1 tablet (325 mg total) by mouth daily. 10/07/18   Larene Pickett, PA-C  fluconazole (DIFLUCAN) 150 MG tablet Take 1 tablet (150 mg total) by mouth once a week. 10/23/19   Jaynee Eagles, PA-C  fluticasone (FLONASE) 50 MCG/ACT nasal spray Place 2 sprays into both nostrils daily. 04/28/19   Kirichenko, Tatyana, PA-C  metroNIDAZOLE (FLAGYL) 500 MG tablet Take 1 tablet (500 mg total) by mouth 2 (two) times daily. 10/28/19   Chase Picket, MD  nystatin-triamcinolone (MYCOLOG II) cream Apply to affected area daily 11/27/19   Orvan July, NP    Family History Family History  Problem Relation Age of Onset  . Cancer Maternal Grandmother        throat  . Cancer Father 29       spinal   . Cancer Maternal Aunt 62       throat    Social History Social History   Tobacco Use  . Smoking status: Current Some Day Smoker    Packs/day: 0.50    Years: 2.00    Pack years: 1.00    Types: Cigarettes  . Smokeless tobacco: Never Used  . Tobacco comment: decreased from one pack  daily     Substance Use Topics  . Alcohol use: Yes    Comment: Occasionally   . Drug use: Not Currently    Types: Marijuana     Allergies   Amoxicillin and Coconut flavor   Review of Systems Review of Systems  Constitutional: Negative.   Cardiovascular: Negative.   Gastrointestinal: Negative.   Genitourinary: Positive for dysuria and frequency. Negative for difficulty urinating, hematuria, pelvic pain, vaginal bleeding, vaginal discharge and vaginal pain.       Perineal Itching/irritation     Physical Exam Triage Vital Signs ED Triage Vitals  Enc Vitals Group     BP 11/27/19 1416 135/68     Pulse Rate 11/27/19 1416 88     Resp 11/27/19 1416 18     Temp 11/27/19 1416 98.8 F (37.1 C)     Temp Source 11/27/19 1416 Tympanic     SpO2 11/27/19 1416 100 %     Weight --      Height --      Head Circumference --      Peak Flow --      Pain Score 11/27/19 1417 10     Pain Loc --      Pain Edu? --       Excl. in [place]? --    No data found.  Updated Vital Signs BP 135/68 (BP Location: Right Arm)   Pulse 88   Temp 98.8 F (37.1 C) (Tympanic)   Resp 18   LMP 11/25/2019 (Exact Date)   SpO2 100%   Visual Acuity Right Eye Distance:   Left Eye Distance:   Bilateral Distance:    Right Eye Near:   Left Eye Near:    Bilateral Near:     Physical Exam Constitutional:      Appearance: Normal appearance. She is normal weight.  HENT:     Head: Normocephalic.  Pulmonary:     Effort: Pulmonary effort is normal.  Abdominal:     Palpations: Abdomen is soft.  Genitourinary:    Vagina: No vaginal discharge.  Musculoskeletal:        General: Normal range of motion.  Skin:    General: Skin is warm and dry.  Neurological:     Mental Status: She is alert.      UC Treatments / Results  Labs (all labs ordered are listed, but only abnormal results are displayed) Labs Reviewed  POCT URINALYSIS DIP (DEVICE) - Abnormal; Notable for the following components:      Result Value   Hgb urine dipstick MODERATE (*)    All other components within normal limits  POC URINE PREG, ED  CERVICOVAGINAL ANCILLARY ONLY    EKG   Radiology No results found.  Procedures Procedures (including critical care time)  Medications Ordered in UC Medications - No data to display  Initial Impression / Assessment and Plan / UC Course  I have reviewed the triage vital signs and the nursing notes.  Pertinent labs & imaging results that were available during my care of the patient were reviewed by me and considered in my medical decision making (see chart for details).     Vaginal irritation Urine did not show any signs of infection today.  Moderate hemoglobin most likely from menstrual cycle. We will give some cream for vaginal irritation Sending swab for testing Final Clinical Impressions(s) / UC Diagnoses   Final diagnoses:  Vaginal irritation     Discharge Instructions     Prescribing you  some cream to help with vaginal irritation, itching and possible yeast infection. We will send a swab for STD screening and let she know if anything is positive on the results Follow up as needed for continued or worsening symptoms     ED Prescriptions    Medication Sig Dispense Auth. Provider   nystatin-triamcinolone (MYCOLOG II) cream Apply to affected area daily 15 g Anniya Whiters A, NP     PDMP not reviewed this encounter.   Loura Halt A, NP 11/27/19 1545

## 2019-11-27 NOTE — Telephone Encounter (Signed)
Patient called office today with concerns regarding recent visit to urgent care. Patient states that she was prescribed antibiotics for yeast and bacterial vaginosis, but does not feel like they are working.  Is still having symptoms and would like to know what she should do. Advised patient to contact OBGYN for a follow up appointment. Will call office back if she cannot be seen by them. [place]

## 2019-11-28 LAB — CERVICOVAGINAL ANCILLARY ONLY
Bacterial Vaginitis (gardnerella): POSITIVE — AB
Candida Glabrata: NEGATIVE
Candida Vaginitis: NEGATIVE
Chlamydia: NEGATIVE
Comment: NEGATIVE
Comment: NEGATIVE
Comment: NEGATIVE
Comment: NEGATIVE
Comment: NEGATIVE
Comment: NORMAL
Neisseria Gonorrhea: NEGATIVE
Trichomonas: NEGATIVE

## 2019-11-29 ENCOUNTER — Telehealth (HOSPITAL_COMMUNITY): Payer: Self-pay | Admitting: Orthopedic Surgery

## 2019-11-29 MED ORDER — METRONIDAZOLE 500 MG PO TABS
500.0000 mg | ORAL_TABLET | Freq: Two times a day (BID) | ORAL | 0 refills | Status: DC
Start: 2019-11-29 — End: 2020-02-20

## 2019-12-02 ENCOUNTER — Ambulatory Visit: Payer: Self-pay

## 2019-12-03 ENCOUNTER — Telehealth: Payer: Self-pay

## 2019-12-03 NOTE — Telephone Encounter (Signed)
COVID-19 Pre-Screening Questions:12/03/19  Do you currently have a fever (>100 F), chills or unexplained body aches?NO  Are you currently experiencing new cough, shortness of breath, sore throat, runny nose? NO .  Marland Kitchen Have you recently travelled outside the state of [place] in the last 14 days? NO .  Have you been in contact with someone that is currently pending confirmation of Covid19 testing or has been confirmed to have the [place] virus?  NO  **If the patient answers NO to ALL questions -  advise the patient to please call the clinic before coming to the office should any symptoms develop.

## 2019-12-04 ENCOUNTER — Ambulatory Visit (INDEPENDENT_AMBULATORY_CARE_PROVIDER_SITE_OTHER): Payer: Self-pay | Admitting: Family

## 2019-12-04 ENCOUNTER — Other Ambulatory Visit: Payer: Self-pay

## 2019-12-04 ENCOUNTER — Encounter: Payer: Self-pay | Admitting: Family

## 2019-12-04 VITALS — BP 104/73 | HR 90 | Temp 98.5°F | Ht 66.0 in | Wt 258.0 lb

## 2019-12-04 DIAGNOSIS — B20 Human immunodeficiency virus [HIV] disease: Secondary | ICD-10-CM

## 2019-12-04 DIAGNOSIS — Z113 Encounter for screening for infections with a predominantly sexual mode of transmission: Secondary | ICD-10-CM

## 2019-12-04 DIAGNOSIS — Z0000 Encounter for general adult medical examination without abnormal findings: Secondary | ICD-10-CM

## 2019-12-04 MED ORDER — PREZCOBIX 800-150 MG PO TABS: 1.0000 | ORAL_TABLET | Freq: Every day | ORAL | 5 refills | Status: DC

## 2019-12-04 MED ORDER — ABACAVIR SULFATE-LAMIVUDINE 600-300 MG PO TABS: 1.0000 | ORAL_TABLET | Freq: Every day | ORAL | 5 refills | Status: DC

## 2019-12-04 NOTE — Assessment & Plan Note (Signed)
Ms. Cumpton continues to have well-controlled HIV disease with good adherence and tolerance to her ART regimen of Prezcobix and Epzicom.  No signs/symptoms of opportunistic infection or progressive HIV disease.  We reviewed her lab work and discussed the plan of care.  Continue current dose of Epzicom and Prezcobix.  Plan for follow-up in 3 months or sooner if needed with lab work 1 to 2 weeks prior to appointment.  Will need to renew financial assistance after July 1.

## 2019-12-04 NOTE — Progress Notes (Signed)
Subjective:    Patient ID: Maria Nicholson, female    DOB: [birth year], 34 y.o.   MRN: [id number]  Chief Complaint  Patient presents with  . Follow-up     HPI:  Maria Nicholson is a 34 y.o. female with HIV disease who was last seen in the office on 09/10/2019 for an acute office visit with concern for tongue lesion.  Most recent blood work completed on 11/21/2019 with a viral load that is undetectable and a CD4 count of 701.  Here today for routine follow-up.  Maria Nicholson continues to take her Prezcobix and Epzicom daily as prescribed with no adverse side effects or missed doses since her last office visit.  Overall feeling well today with no new concerns/complaints.  She is interested in receiving the Covid vaccine. Denies fevers, chills, night sweats, headaches, changes in vision, neck pain/stiffness, nausea, diarrhea, vomiting, lesions or rashes.  Capizzi has no problems obtaining her medication from the pharmacy.  Denies feelings of being down, depressed, or hopeless recently.  No recreational or illicit drug use with occasional alcohol consumption and approximately 1/4 pack of cigarettes per day.  She is currently sexually active and declines condoms.   Allergies  Allergen Reactions  . Amoxicillin Hives    Did it involve swelling of the face/tongue/throat, SOB, or low BP? No Did it involve sudden or severe rash/hives, skin peeling, or any reaction on the inside of your mouth or nose? Yes Did you need to seek medical attention at a hospital or doctor's office? Yes When did it last happen?34 yrs old If all above answers are "NO", may proceed with cephalosporin use.   [name] Flavor Swelling      Outpatient Medications Prior to Visit  Medication Sig Dispense Refill  . ferrous sulfate 325 (65 FE) MG tablet Take 1 tablet (325 mg total) by mouth daily. 30 tablet 0  . fluticasone (FLONASE) 50 MCG/ACT nasal spray Place 2 sprays into both nostrils daily. 16 mL 2  . metroNIDAZOLE  (FLAGYL) 500 MG tablet Take 1 tablet (500 mg total) by mouth 2 (two) times daily. 14 tablet 0  . nystatin-triamcinolone (MYCOLOG II) cream Apply to affected area daily 15 g 0  . abacavir-lamiVUDine (EPZICOM) 600-300 MG tablet Take 1 tablet by mouth daily. 30 tablet 5  . darunavir-cobicistat (PREZCOBIX) 800-150 MG tablet Take 1 tablet by mouth daily with breakfast. Swallow whole. Do NOT crush, break or chew tablets. Take with food. 30 tablet 5  . cetirizine (ZYRTEC ALLERGY) 10 MG tablet Take 1 tablet (10 mg total) by mouth daily. 30 tablet 0  . clotrimazole (MYCELEX) 10 MG troche Take 1 tablet (10 mg total) by mouth 5 (five) times daily. 70 tablet 0  . fluconazole (DIFLUCAN) 150 MG tablet Take 1 tablet (150 mg total) by mouth once a week. 2 tablet 0  . metroNIDAZOLE (FLAGYL) 500 MG tablet Take 1 tablet (500 mg total) by mouth 2 (two) times daily. 14 tablet 0   No facility-administered medications prior to visit.     Past Medical History:  Diagnosis Date  . Asthma   . Bronchitis   . HIV (human immunodeficiency virus infection) ([place])   . No pertinent past medical history      Past Surgical History:  Procedure Laterality Date  . DILATION AND CURETTAGE, DIAGNOSTIC / THERAPEUTIC         Review of Systems  Constitutional: Negative for appetite change, chills, diaphoresis, fatigue, fever and unexpected weight change.  Eyes:  Negative for acute change in vision  Respiratory: Negative for chest tightness, shortness of breath and wheezing.   Cardiovascular: Negative for chest pain.  Gastrointestinal: Negative for diarrhea, nausea and vomiting.  Genitourinary: Negative for dysuria, pelvic pain and vaginal discharge.  Musculoskeletal: Negative for neck pain and neck stiffness.  Skin: Negative for rash.  Neurological: Negative for seizures, syncope, weakness and headaches.  Hematological: Negative for adenopathy. Does not bruise/bleed easily.  Psychiatric/Behavioral: Negative for  hallucinations.      Objective:    BP 104/73   Pulse 90   Temp 98.5 F (36.9 C)   Ht 5\' 6"  (1.676 m)   Wt 258 lb (117 kg)   LMP 11/25/2019 (Exact Date)   SpO2 95%   BMI 41.64 kg/m  Nursing note and vital signs reviewed.  Physical Exam Constitutional:      General: [name]    Appearance: She is well-developed.  Eyes:     Conjunctiva/sclera: Conjunctivae normal.  Cardiovascular:     Rate and Rhythm: Normal rate and regular rhythm.     Heart sounds: Normal heart sounds. No murmur heard.  No friction rub. No gallop.   Pulmonary:     Effort: Pulmonary effort is normal. No respiratory distress.     Breath sounds: Normal breath sounds. No wheezing or rales.  Chest:     Chest wall: No tenderness.  Abdominal:     General: Bowel sounds are normal.     Palpations: Abdomen is soft.     Tenderness: There is no abdominal tenderness.  Musculoskeletal:     Cervical back: Neck supple.  Lymphadenopathy:     Cervical: No cervical adenopathy.  Skin:    General: Skin is warm and dry.     Findings: No rash.  Neurological:     Mental Status: She is alert and oriented to person, place, and time.  Psychiatric:        Behavior: Behavior normal.        Thought Content: Thought content normal.        Judgment: Judgment normal.      Depression screen [facility] 2/9 04/15/2019 01/29/2019 11/06/2017 11/18/2011 11/15/2011  Decreased Interest 0 0 3 0 1  Down, Depressed, Hopeless 0 0 3 0 1  PHQ - 2 Score 0 0 6 0 2       Assessment & Plan:    Patient Active Problem List   Diagnosis Date Noted  . Tongue lesion 09/10/2019  . Vaginal itching 01/29/2019  . Healthcare maintenance 11/27/2018  . Fibroids 11/27/2018  . Human immunodeficiency virus (HIV) disease ([place]) 11/06/2017  . Eczema 11/17/2011     Problem List Items Addressed This Visit      Other   Human immunodeficiency virus (HIV) disease ([place]) - Primary    Maria Nicholson continues to have well-controlled HIV disease  with good adherence and tolerance to her ART regimen of Prezcobix and Epzicom.  No signs/symptoms of opportunistic infection or progressive HIV disease.  We reviewed her lab work and discussed the plan of care.  Continue current dose of Epzicom and Prezcobix.  Plan for follow-up in 3 months or sooner if needed with lab work 1 to 2 weeks prior to appointment.  Will need to renew financial assistance after July 1.      Relevant Medications   abacavir-lamiVUDine (EPZICOM) 600-300 MG tablet   darunavir-cobicistat (PREZCOBIX) 800-150 MG tablet   Other Relevant Orders   HIV-1 RNA quant-no reflex-bld   T-helper  cell (CD4)- ([facility] only)   Comprehensive metabolic panel   Healthcare maintenance     Discussed importance of safe sexual practice to reduce risk of STI.  Condoms declined.  Discussed/recommended Covid vaccine with information provided in after visit summary.       Other Visit Diagnoses    Screening for STDs (sexually transmitted diseases)       Relevant Orders   RPR      I have discontinued Suriya T. Pita's clotrimazole and fluconazole. I am also having her maintain her ferrous sulfate, fluticasone, cetirizine, nystatin-triamcinolone, metroNIDAZOLE, abacavir-lamiVUDine, and Prezcobix.   Meds ordered this encounter  Medications  . abacavir-lamiVUDine (EPZICOM) 600-300 MG tablet    Sig: Take 1 tablet by mouth daily.    Dispense:  30 tablet    Refill:  5    Order Specific Question:   Supervising Provider    Answer:   Carlyle Basques [[id number]]  . darunavir-cobicistat (PREZCOBIX) 800-150 MG tablet    Sig: Take 1 tablet by mouth daily with breakfast. Swallow whole. Do NOT crush, break or chew tablets. Take with food.    Dispense:  30 tablet    Refill:  5    Order Specific Question:   Supervising Provider    Answer:   Carlyle Basques [[id number]]     Follow-up: Return in about 4 months (around 04/04/2020), or if symptoms worsen or fail to improve.   Terri Piedra, MSN, FNP-C  Nurse Practitioner [facility] [place] number: [phone]

## 2019-12-04 NOTE — Assessment & Plan Note (Signed)
   Discussed importance of safe sexual practice to reduce risk of STI.  Condoms declined.  Discussed/recommended Covid vaccine with information provided in after visit summary.

## 2019-12-04 NOTE — Patient Instructions (Addendum)
Nice to see you.  Continue to take your Prezcobix and Epzicom as prescribed.  Refills have been sent to the pharmacy.  Recommend COVID vaccination when able through pharmacy or  [email or web address].   Schedule an appoint for UMAP/ADAP renewal after July 1st.   Plan for follow up in 4 months or sooner if needed with lab work 1-2 weeks prior to your appoint.  Have a great day and stay safe!

## 2020-01-01 IMAGING — CR DG CHEST 2V
2 series · 2 of 2 positions shown · non-contrast
Comparison: April 11, 2017

CLINICAL DATA: Cough and headache

EXAM:
CHEST - 2 VIEW

[chest pa]
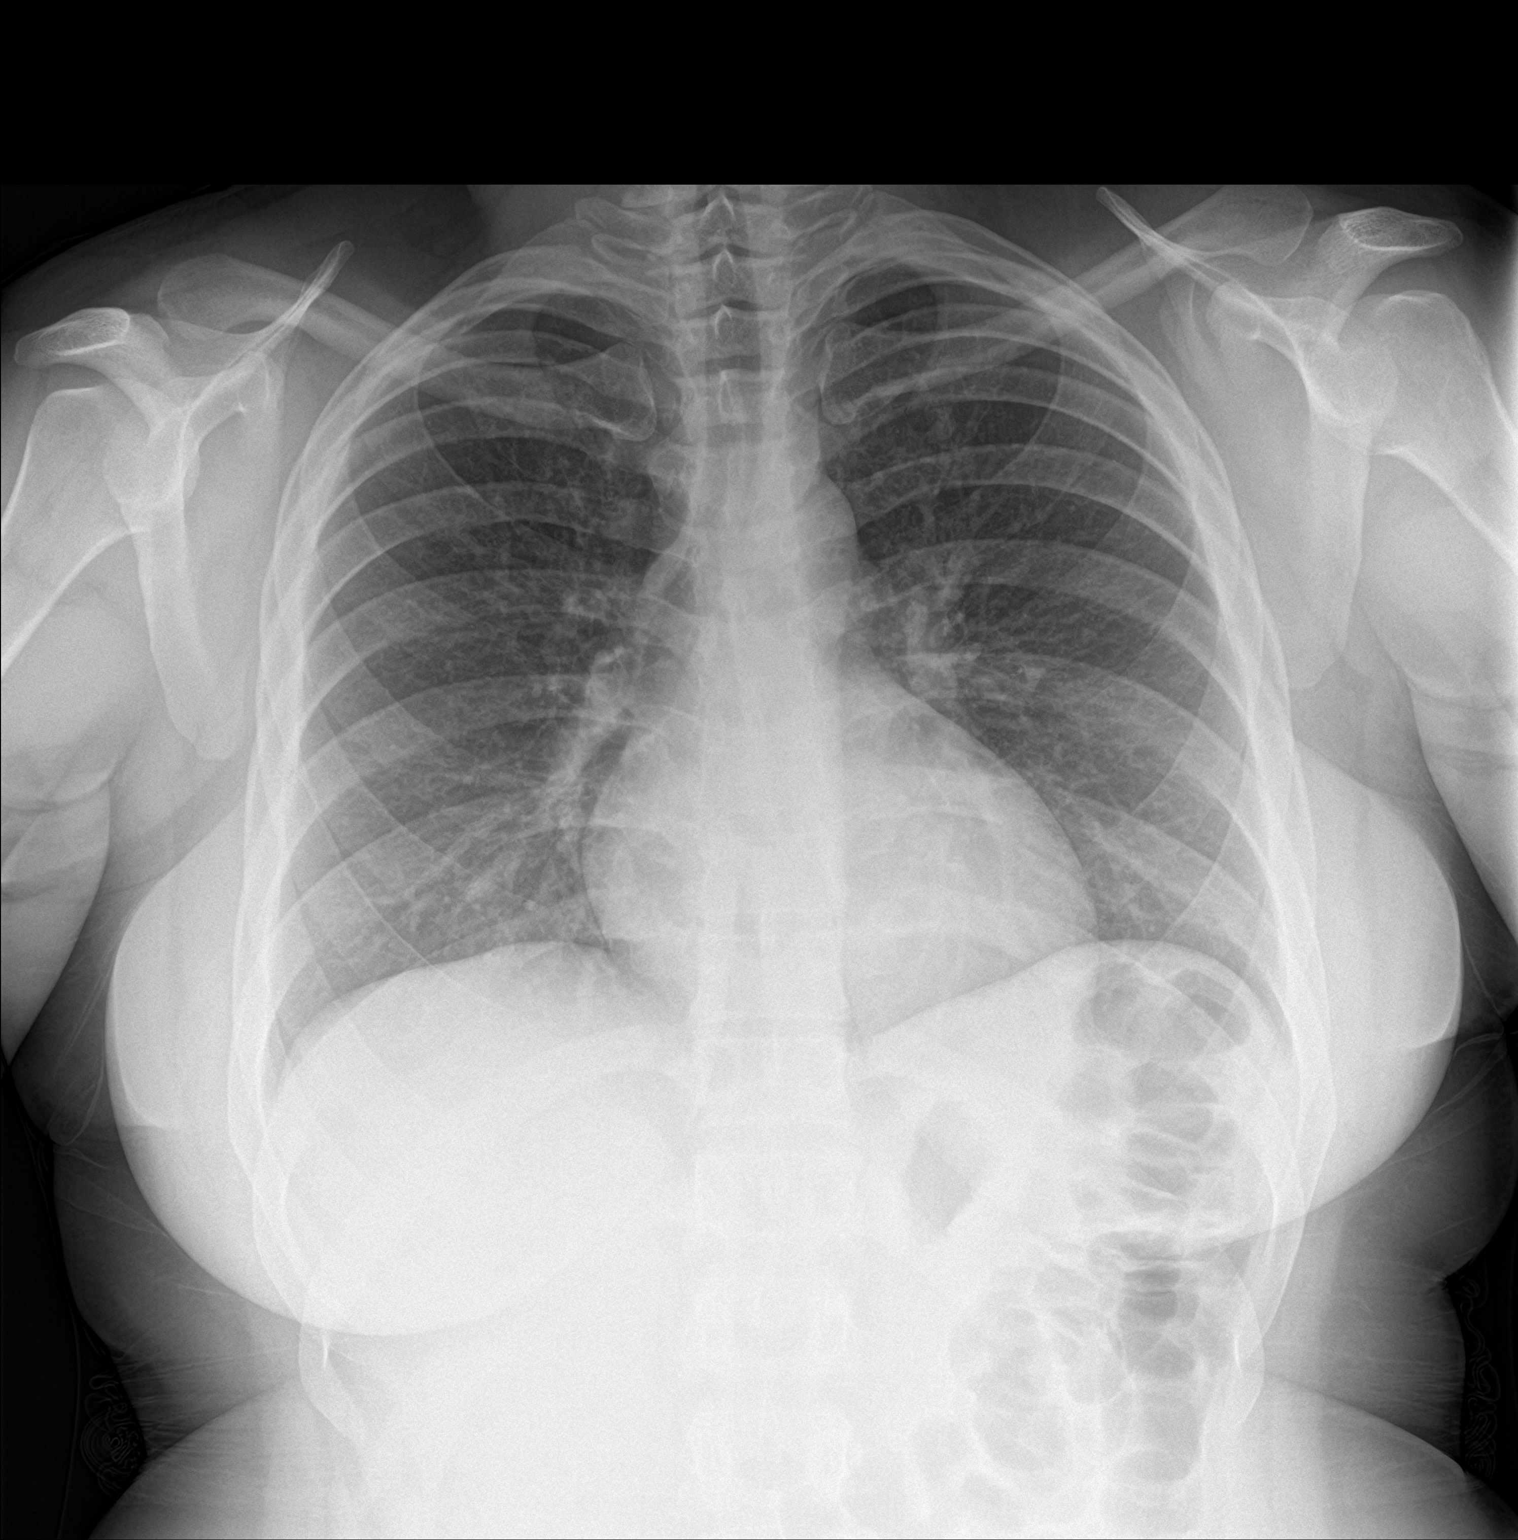

[chest lat]
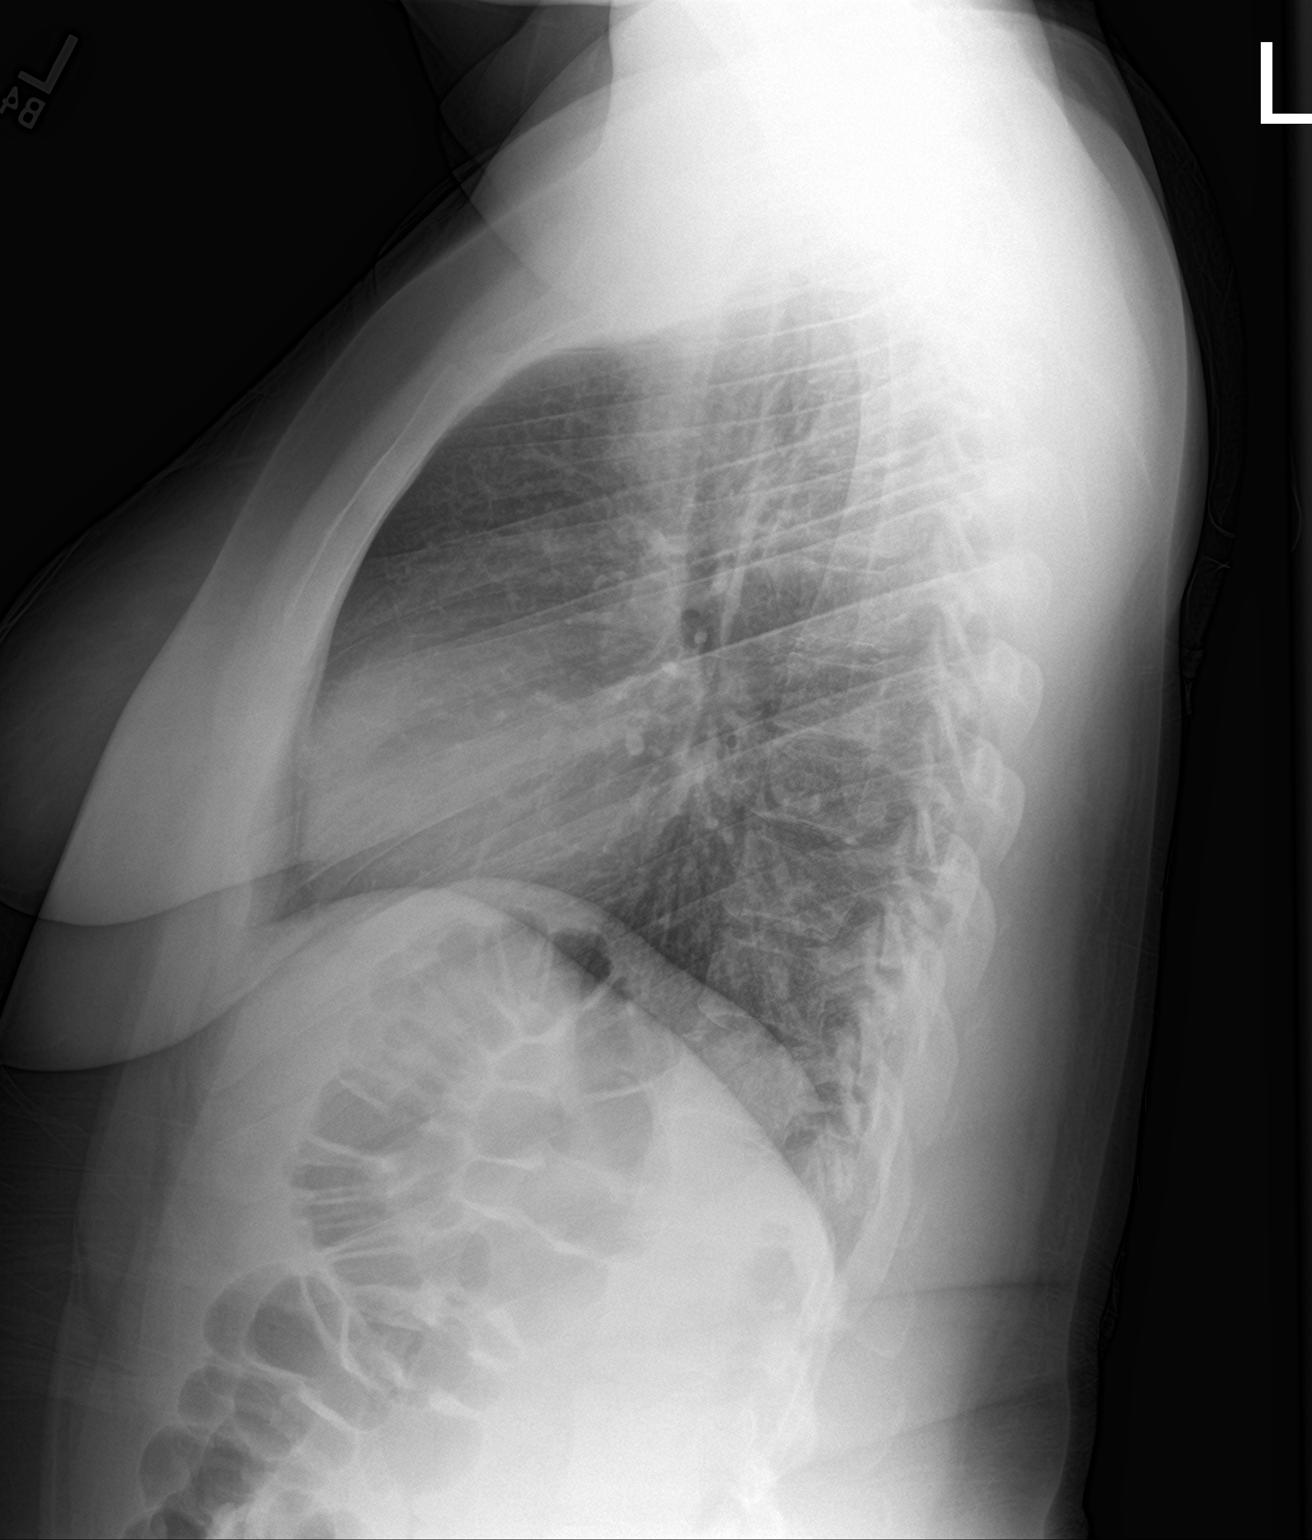

[2 of 2 positions shown; findings below may reference images not displayed]

FINDINGS: Lungs are clear. Heart size and pulmonary vascularity are normal. No
adenopathy. No pneumothorax. No bone lesions.
IMPRESSION: No edema or consolidation.

## 2020-01-02 ENCOUNTER — Ambulatory Visit: Payer: Self-pay

## 2020-01-23 ENCOUNTER — Ambulatory Visit: Payer: Self-pay

## 2020-01-23 ENCOUNTER — Ambulatory Visit (HOSPITAL_COMMUNITY)
Admission: EM | Admit: 2020-01-23 | Discharge: 2020-01-23 | Disposition: A | Discharge status: Home / Self Care | Payer: Self-pay | Attending: Family Medicine | Admitting: Family Medicine

## 2020-01-23 ENCOUNTER — Other Ambulatory Visit: Payer: Self-pay

## 2020-01-23 ENCOUNTER — Encounter (HOSPITAL_COMMUNITY): Payer: Self-pay | Admitting: Emergency Medicine

## 2020-01-23 DIAGNOSIS — R350 Frequency of micturition: Secondary | ICD-10-CM

## 2020-01-23 DIAGNOSIS — N390 Urinary tract infection, site not specified: Secondary | ICD-10-CM

## 2020-01-23 DIAGNOSIS — R300 Dysuria: Secondary | ICD-10-CM

## 2020-01-23 LAB — POCT URINALYSIS DIPSTICK, ED / UC
Bilirubin Urine: NEGATIVE
Glucose, UA: NEGATIVE mg/dL
Hgb urine dipstick: NEGATIVE
Ketones, ur: NEGATIVE mg/dL
Leukocytes,Ua: NEGATIVE
Nitrite: NEGATIVE
Protein, ur: NEGATIVE mg/dL
Specific Gravity, Urine: 1.025 (ref 1.005–1.030)
Urobilinogen, UA: 0.2 mg/dL (ref 0.0–1.0)
pH: 5.5 (ref 5.0–8.0)

## 2020-01-23 NOTE — Discharge Instructions (Signed)
Your UA was normal  Increase fluid intake   May take AZO as needed for urinary discomfort  Follow up with this office or with primary care

## 2020-01-23 NOTE — ED Triage Notes (Signed)
Pt c/o burning with urinating x 2 days. She states she has also felt nauseous.

## 2020-01-23 NOTE — ED Provider Notes (Signed)
[facility]   CC: UTI  SUBJECTIVE:  Maria Nicholson is a 34 y.o. female who complains of urinary frequency, and dysuria for the last 2 days. Patient denies a precipitating event, recent sexual encounter, excessive caffeine intake.  Localizes the pain to the lower abdomen. Pain is intermittent and describes it as burning. Has not tried OTC medications. Symptoms are made worse with urination.  Admits to similar symptoms in the past.  Denies fever, chills, nausea, vomiting, abdominal pain, flank pain, abnormal vaginal discharge or bleeding, hematuria.    LMP: Patient's last menstrual period was 01/13/2020.  ROS: As in HPI.  All other pertinent ROS negative.     Past Medical History:  Diagnosis Date  . Asthma   . Bronchitis   . HIV (human immunodeficiency virus infection) ([place])   . No pertinent past medical history    Past Surgical History:  Procedure Laterality Date  . DILATION AND CURETTAGE, DIAGNOSTIC / THERAPEUTIC     Allergies  Allergen Reactions  . Amoxicillin Hives    Did it involve swelling of the face/tongue/throat, SOB, or low BP? No Did it involve sudden or severe rash/hives, skin peeling, or any reaction on the inside of your mouth or nose? Yes Did you need to seek medical attention at a hospital or doctor's office? Yes When did it last happen?34 yrs old If all above answers are "NO", may proceed with cephalosporin use.   Marland Kitchen Coconut Flavor Swelling   No current facility-administered medications on file prior to encounter.   Current Outpatient Medications on File Prior to Encounter  Medication Sig Dispense Refill  . abacavir-lamiVUDine (EPZICOM) 600-300 MG tablet Take 1 tablet by mouth daily. 30 tablet 5  . darunavir-cobicistat (PREZCOBIX) 800-150 MG tablet Take 1 tablet by mouth daily with breakfast. Swallow whole. Do NOT crush, break or chew tablets. Take with food. 30 tablet 5  . ferrous sulfate 325 (65 FE) MG tablet Take 1 tablet (325 mg total) by  mouth daily. 30 tablet 0  . fluticasone (FLONASE) 50 MCG/ACT nasal spray Place 2 sprays into both nostrils daily. 16 mL 2  . nystatin-triamcinolone (MYCOLOG II) cream Apply to affected area daily 15 g 0  . cetirizine (ZYRTEC ALLERGY) 10 MG tablet Take 1 tablet (10 mg total) by mouth daily. 30 tablet 0  . metroNIDAZOLE (FLAGYL) 500 MG tablet Take 1 tablet (500 mg total) by mouth 2 (two) times daily. 14 tablet 0   Social History   Socioeconomic History  . Marital status: Single    Spouse name: Not on file  . Number of children: Not on file  . Years of education: Not on file  . Highest education level: Not on file  Occupational History  . Not on file  Tobacco Use  . Smoking status: Current Some Day Smoker    Packs/day: 0.50    Years: 2.00    Pack years: 1.00    Types: Cigarettes  . Smokeless tobacco: Never Used  . Tobacco comment: decreased from one pack daily     Vaping Use  . Vaping Use: Never used  Substance and Sexual Activity  . Alcohol use: Yes    Comment: Occasionally   . Drug use: Not Currently    Types: Marijuana  . Sexual activity: Yes    Partners: Male    Birth control/protection: None, Condom    Comment: pt. given condoms  Other Topics Concern  . Not on file  Social History Narrative  . Not on file  Social Determinants of Health   Financial Resource Strain:   . Difficulty of Paying Living Expenses:   Food Insecurity:   . Worried About Charity fundraiser in the Last Year:   . Arboriculturist in the Last Year:   Transportation Needs:   . Film/video editor (Medical):   Marland Kitchen Lack of Transportation (Non-Medical):   Physical Activity:   . Days of Exercise per Week:   . Minutes of Exercise per Session:   Stress:   . Feeling of Stress :   Social Connections:   . Frequency of Communication with Friends and Family:   . Frequency of Social Gatherings with Friends and Family:   . Attends Religious Services:   . Active Member of Clubs or Organizations:   .  Attends Archivist Meetings:   Marland Kitchen Marital Status:   Intimate Partner Violence:   . Fear of Current or Ex-Partner:   . Emotionally Abused:   Marland Kitchen Physically Abused:   . Sexually Abused:    Family History  Problem Relation Age of Onset  . Cancer Maternal Grandmother        throat  . Cancer Father 35       spinal   . Cancer Maternal Aunt 62       throat    OBJECTIVE:  Vitals:   01/23/20 1945  BP: 115/65  Pulse: 85  Resp: 16  Temp: 98.2 F (36.8 C)  TempSrc: Oral  SpO2: 100%   General appearance: AOx3 in no acute distress HEENT: NCAT.  Oropharynx clear.  Lungs: clear to auscultation bilaterally without adventitious breath sounds Heart: regular rate and rhythm.  Radial pulses 2+ symmetrical bilaterally Abdomen: soft; non-distended; no tenderness; bowel sounds present; no guarding or rebound tenderness Back: no CVA tenderness Extremities: no edema; symmetrical with no gross deformities Skin: warm and dry Neurologic: Ambulates from chair to exam table without difficulty Psychological: alert and cooperative; normal mood and affect  Labs Reviewed  POCT URINALYSIS DIPSTICK, ED / UC    ASSESSMENT & PLAN:  1. Dysuria   2. Urinary frequency     No orders of the defined types were placed in this encounter.  UA negative for infection Push fluids and get plenty of rest.   Take AZO as needed for symptomatic relief Follow up with PCP if symptoms persists Return here or go to ER if you have any new or worsening symptoms such as fever, worsening abdominal pain, nausea/vomiting, flank pain  Outlined signs and symptoms indicating need for more acute intervention. Patient verbalized understanding. After Visit Summary given.     Faustino Congress, NP 01/23/20 2020

## 2020-01-24 ENCOUNTER — Encounter: Payer: Self-pay | Admitting: Family

## 2020-02-11 ENCOUNTER — Ambulatory Visit: Payer: Self-pay | Admitting: Obstetrics

## 2020-02-20 ENCOUNTER — Ambulatory Visit (INDEPENDENT_AMBULATORY_CARE_PROVIDER_SITE_OTHER): Payer: Self-pay | Admitting: Obstetrics

## 2020-02-20 ENCOUNTER — Encounter: Payer: Self-pay | Admitting: Obstetrics

## 2020-02-20 ENCOUNTER — Other Ambulatory Visit: Payer: Self-pay

## 2020-02-20 ENCOUNTER — Other Ambulatory Visit (HOSPITAL_COMMUNITY)
Admission: RE | Admit: 2020-02-20 | Discharge: 2020-02-20 | Disposition: A | Discharge status: Home / Self Care | Payer: Self-pay | Source: Ambulatory Visit | Attending: Obstetrics | Admitting: Obstetrics

## 2020-02-20 VITALS — BP 127/75 | HR 95 | Ht 67.0 in | Wt 261.0 lb

## 2020-02-20 DIAGNOSIS — Z21 Asymptomatic human immunodeficiency virus [HIV] infection status: Secondary | ICD-10-CM

## 2020-02-20 DIAGNOSIS — N898 Other specified noninflammatory disorders of vagina: Secondary | ICD-10-CM | POA: Insufficient documentation

## 2020-02-20 MED ORDER — FLUCONAZOLE 200 MG PO TABS: 200.0000 mg | ORAL_TABLET | ORAL | 2 refills | Status: DC

## 2020-02-20 MED ORDER — METRONIDAZOLE 0.75 % VA GEL: 1.0000 | Freq: Two times a day (BID) | VAGINAL | 5 refills | Status: DC

## 2020-02-20 NOTE — Progress Notes (Signed)
Patient ID: Maria Nicholson, female   DOB: 08-Jun-1986, 34 y.o.   MRN: [id number]  Chief Complaint  Patient presents with  . Vaginitis    HPI Maria Nicholson is a 34 y.o. female.  VAGINAL DISCHARGE  HPI  Past Medical History:  Diagnosis Date  . Asthma   . Bronchitis   . HIV (human immunodeficiency virus infection) ([place])   . No pertinent past medical history     Past Surgical History:  Procedure Laterality Date  . DILATION AND CURETTAGE, DIAGNOSTIC / THERAPEUTIC      Family History  Problem Relation Age of Onset  . Cancer Maternal Grandmother        throat  . Cancer Father 30       spinal   . Cancer Maternal Aunt 62       throat    Social History Social History   Tobacco Use  . Smoking status: Current Some Day Smoker    Packs/day: 0.50    Years: 2.00    Pack years: 1.00    Types: Cigarettes  . Smokeless tobacco: Never Used  . Tobacco comment: decreased from one pack daily     Vaping Use  . Vaping Use: Never used  Substance Use Topics  . Alcohol use: Yes    Comment: Occasionally   . Drug use: Not Currently    Types: Marijuana    Allergies  Allergen Reactions  . Amoxicillin Hives    Did it involve swelling of the face/tongue/throat, SOB, or low BP? No Did it involve sudden or severe rash/hives, skin peeling, or any reaction on the inside of your mouth or nose? Yes Did you need to seek medical attention at a hospital or doctor's office? Yes When did it last happen?34 yrs old If all above answers are "NO", may proceed with cephalosporin use.   Marland Kitchen Coconut Flavor Swelling    Current Outpatient Medications  Medication Sig Dispense Refill  . abacavir-lamiVUDine (EPZICOM) 600-300 MG tablet Take 1 tablet by mouth daily. 30 tablet 5  . darunavir-cobicistat (PREZCOBIX) 800-150 MG tablet Take 1 tablet by mouth daily with breakfast. Swallow whole. Do NOT crush, break or chew tablets. Take with food. 30 tablet 5  . ferrous sulfate 325 (65 FE) MG tablet Take 1  tablet (325 mg total) by mouth daily. 30 tablet 0  . fluticasone (FLONASE) 50 MCG/ACT nasal spray Place 2 sprays into both nostrils daily. 16 mL 2  . cetirizine (ZYRTEC ALLERGY) 10 MG tablet Take 1 tablet (10 mg total) by mouth daily. 30 tablet 0  . fluconazole (DIFLUCAN) 200 MG tablet Take 1 tablet (200 mg total) by mouth every 3 (three) days. 3 tablet 2  . metroNIDAZOLE (METROGEL VAGINAL) 0.75 % vaginal gel Place 1 Applicatorful vaginally 2 (two) times daily. 70 g 5  . nystatin-triamcinolone (MYCOLOG II) cream Apply to affected area daily (Patient not taking: Reported on 02/20/2020) 15 g 0   No current facility-administered medications for this visit.    Review of Systems Review of Systems Constitutional: negative for fatigue and weight loss Respiratory: negative for cough and wheezing Cardiovascular: negative for chest pain, fatigue and palpitations Gastrointestinal: negative for abdominal pain and change in bowel habits Genitourinary: POSITIVE FOR VAGINAL DISCHARGE Integument/breast: negative for nipple discharge Musculoskeletal:negative for myalgias Neurological: negative for gait problems and tremors Behavioral/Psych: negative for abusive relationship, depression Endocrine: negative for temperature intolerance      Blood pressure 127/75, pulse 95, height 5\' 7"  (1.702 m), weight 261 lb (  118.4 kg), last menstrual period 02/12/2020.  Physical Exam Physical Exam General:   alert and no distress  Skin:   no rash or abnormalities  Lungs:   clear to auscultation bilaterally  Heart:   regular rate and rhythm, S1, S2 normal, no murmur, click, rub or gallop  Breasts:   normal without suspicious masses, skin or nipple changes or axillary nodes  Abdomen:  normal findings: no organomegaly, soft, non-tender and no hernia  Pelvis:  External genitalia: normal general appearance Urinary system: urethral meatus normal and bladder without fullness, nontender Vaginal: normal without tenderness,  induration or masses Cervix: normal appearance Adnexa: normal bimanual exam Uterus: anteverted and non-tender, normal size    50% of 20 min visit spent on counseling and coordination of care.   Data Reviewed Wet Prep Cultures  Assessment     1. Vaginal discharge Rx: - Cervicovaginal ancillary only( [place]) - metroNIDAZOLE (METROGEL VAGINAL) 0.75 % vaginal gel; Place 1 Applicatorful vaginally 2 (two) times daily.  Dispense: 70 g; Refill: 5 - fluconazole (DIFLUCAN) 200 MG tablet; Take 1 tablet (200 mg total) by mouth every 3 (three) days.  Dispense: 3 tablet; Refill: 2  2. HIV infection, asymptomatic ([place])   Plan  Follow up in 3 months for Annual / Pap    Meds ordered this encounter  Medications  . metroNIDAZOLE (METROGEL VAGINAL) 0.75 % vaginal gel    Sig: Place 1 Applicatorful vaginally 2 (two) times daily.    Dispense:  70 g    Refill:  5  . fluconazole (DIFLUCAN) 200 MG tablet    Sig: Take 1 tablet (200 mg total) by mouth every 3 (three) days.    Dispense:  3 tablet    Refill:  2      Shelly Bombard, MD 02/20/2020 5:03 PM

## 2020-02-20 NOTE — Progress Notes (Signed)
Patient presents for problem visit.  [id number] recurrent BV and Yeast infection. Pt states she is prone to them after her cycles.   Pt request STD Screening on swab as well.

## 2020-02-25 ENCOUNTER — Other Ambulatory Visit: Payer: Self-pay | Admitting: Obstetrics

## 2020-02-25 LAB — CERVICOVAGINAL ANCILLARY ONLY
Bacterial Vaginitis (gardnerella): POSITIVE — AB
Candida Glabrata: NEGATIVE
Candida Vaginitis: NEGATIVE
Chlamydia: NEGATIVE
Comment: NEGATIVE
Comment: NEGATIVE
Comment: NEGATIVE
Comment: NEGATIVE
Comment: NEGATIVE
Comment: NORMAL
Neisseria Gonorrhea: NEGATIVE
Trichomonas: NEGATIVE

## 2020-02-29 ENCOUNTER — Ambulatory Visit (HOSPITAL_COMMUNITY)
Admission: EM | Admit: 2020-02-29 | Discharge: 2020-02-29 | Disposition: A | Discharge status: Home / Self Care | Payer: Self-pay | Attending: Emergency Medicine | Admitting: Emergency Medicine

## 2020-02-29 ENCOUNTER — Encounter (HOSPITAL_COMMUNITY): Payer: Self-pay

## 2020-02-29 ENCOUNTER — Other Ambulatory Visit: Payer: Self-pay

## 2020-02-29 DIAGNOSIS — H1032 Unspecified acute conjunctivitis, left eye: Secondary | ICD-10-CM

## 2020-02-29 MED ORDER — POLYMYXIN B-TRIMETHOPRIM 10000-0.1 UNIT/ML-% OP SOLN: 1.0000 [drp] | Freq: Four times a day (QID) | OPHTHALMIC | 0 refills | Status: AC

## 2020-02-29 NOTE — Discharge Instructions (Signed)
Use the antibiotic eyedrops as prescribed.    Follow-up with your eye doctor for a recheck in 1 to 2 days if your symptoms are not improving.    Go to the emergency department if you have acute eye pain, changes in your vision, or other concerning symptoms.    

## 2020-02-29 NOTE — ED Provider Notes (Signed)
[place]    CSN: [id number] Arrival date & time: 02/29/20  1102      History   Chief Complaint Chief Complaint  Patient presents with  . Conjunctivitis    HPI Maria Nicholson is a 34 y.o. female.   Patient presents with left eye itching x1 week.  She reports purulent drainage, crusting, mild edema x1 day.  She denies acute eye pain or changes in her vision.  No known injury.  No sensation of foreign body.  She denies fever, chills, cough, shortness of breath, or other symptoms.  No treatments attempted at home.  The history is provided by the patient.    Past Medical History:  Diagnosis Date  . Asthma   . Bronchitis   . HIV (human immunodeficiency virus infection) ([place])   . No pertinent past medical history     Patient Active Problem List   Diagnosis Date Noted  . Tongue lesion 09/10/2019  . Vaginal itching 01/29/2019  . Healthcare maintenance 11/27/2018  . Fibroids 11/27/2018  . Human immunodeficiency virus (HIV) disease ([place]) 11/06/2017  . Eczema 11/17/2011    Past Surgical History:  Procedure Laterality Date  . DILATION AND CURETTAGE, DIAGNOSTIC / THERAPEUTIC      OB History    Gravida  3   Para  0   Term      Preterm      AB  3   Living  0     SAB  2   TAB  1   Ectopic      Multiple      Live Births               Home Medications    Prior to Admission medications   Medication Sig Start Date End Date Taking? Authorizing Provider  abacavir-lamiVUDine (EPZICOM) 600-300 MG tablet Take 1 tablet by mouth daily. 12/04/19   Golden Circle, FNP  cetirizine (ZYRTEC ALLERGY) 10 MG tablet Take 1 tablet (10 mg total) by mouth daily. 07/05/19 10/23/19  Darr, Marguerita Beards, PA-C  darunavir-cobicistat (PREZCOBIX) 800-150 MG tablet Take 1 tablet by mouth daily with breakfast. Swallow whole. Do NOT crush, break or chew tablets. Take with food. 12/04/19   Golden Circle, FNP  ferrous sulfate 325 (65 FE) MG tablet Take 1 tablet (325 mg total)  by mouth daily. 10/07/18   Larene Pickett, PA-C  fluconazole (DIFLUCAN) 200 MG tablet Take 1 tablet (200 mg total) by mouth every 3 (three) days. 02/20/20   Shelly Bombard, MD  fluticasone (FLONASE) 50 MCG/ACT nasal spray Place 2 sprays into both nostrils daily. 04/28/19   Kirichenko, Tatyana, PA-C  metroNIDAZOLE (METROGEL VAGINAL) 0.75 % vaginal gel Place 1 Applicatorful vaginally 2 (two) times daily. 02/20/20   Shelly Bombard, MD  nystatin-triamcinolone (MYCOLOG II) cream Apply to affected area daily Patient not taking: Reported on 02/20/2020 11/27/19   Loura Halt A, NP  trimethoprim-polymyxin b (POLYTRIM) ophthalmic solution Place 1 drop into both eyes 4 (four) times daily for 7 days. 02/29/20 03/07/20  Sharion Balloon, NP    Family History Family History  Problem Relation Age of Onset  . Cancer Maternal Grandmother        throat  . Cancer Father 87       spinal   . Cancer Maternal Aunt 62       throat    Social History Social History   Tobacco Use  . Smoking status: Current Some Day Smoker  Packs/day: 0.50    Years: 2.00    Pack years: 1.00    Types: Cigarettes  . Smokeless tobacco: Never Used  . Tobacco comment: decreased from one pack daily     Vaping Use  . Vaping Use: Never used  Substance Use Topics  . Alcohol use: Yes    Comment: Occasionally   . Drug use: Not Currently    Types: Marijuana     Allergies   Amoxicillin and Coconut flavor   Review of Systems Review of Systems  Constitutional: Negative for chills and fever.  HENT: Negative for ear pain and sore throat.   Eyes: Positive for discharge, redness and itching. Negative for pain and visual disturbance.  Respiratory: Negative for cough and shortness of breath.   Cardiovascular: Negative for chest pain and palpitations.  Gastrointestinal: Negative for abdominal pain and vomiting.  Genitourinary: Negative for dysuria and hematuria.  Musculoskeletal: Negative for arthralgias and back pain.  Skin:  Negative for color change and rash.  Neurological: Negative for seizures and syncope.  All other systems reviewed and are negative.    Physical Exam Triage Vital Signs ED Triage Vitals [02/29/20 1226]  Enc Vitals Group     BP 113/67     Pulse Rate 91     Resp 16     Temp 98.7 F (37.1 C)     Temp Source Oral     SpO2 99 %     Weight 240 lb (108.9 kg)     Height 5\' 7"  (1.702 m)     Head Circumference      Peak Flow      Pain Score 0     Pain Loc      Pain Edu?      Excl. in [place]?    No data found.  Updated Vital Signs BP 113/67   Pulse 91   Temp 98.7 F (37.1 C) (Oral)   Resp 16   Ht 5\' 7"  (1.702 m)   Wt 240 lb (108.9 kg)   LMP 02/12/2020 (Exact Date)   SpO2 99%   BMI 37.59 kg/m   Visual Acuity Right Eye Distance:   Left Eye Distance:   Bilateral Distance:    Right Eye Near:   Left Eye Near:    Bilateral Near:     Physical Exam Vitals and nursing note reviewed.  Constitutional:      General: [name]    Appearance: She is well-developed.  HENT:     Head: Normocephalic and atraumatic.     Right Ear: Tympanic membrane normal.     Left Ear: Tympanic membrane normal.     Nose: Nose normal.     Mouth/Throat:     Mouth: Mucous membranes are moist.     Pharynx: Oropharynx is clear.  Eyes:     General: Lids are normal. Vision grossly intact.     Extraocular Movements: Extraocular movements intact.     Conjunctiva/sclera:     Left eye: Left conjunctiva is injected. Exudate present.     Pupils: Pupils are equal, round, and reactive to light.  Cardiovascular:     Rate and Rhythm: Normal rate and regular rhythm.     Heart sounds: No murmur heard.   Pulmonary:     Effort: Pulmonary effort is normal. No respiratory distress.     Breath sounds: Normal breath sounds.  Abdominal:     Palpations: Abdomen is soft.     Tenderness: There is no  abdominal tenderness. There is no guarding or rebound.  Musculoskeletal:     Cervical back: Neck  supple.  Skin:    General: Skin is warm and dry.     Findings: No rash.  Neurological:     General: No focal deficit present.     Mental Status: She is alert and oriented to person, place, and time.     Gait: Gait normal.  Psychiatric:        Mood and Affect: Mood normal.        Behavior: Behavior normal.      UC Treatments / Results  Labs (all labs ordered are listed, but only abnormal results are displayed) Labs Reviewed - No data to display  EKG   Radiology No results found.  Procedures Procedures (including critical care time)  Medications Ordered in UC Medications - No data to display  Initial Impression / Assessment and Plan / UC Course  I have reviewed the triage vital signs and the nursing notes.  Pertinent labs & imaging results that were available during my care of the patient were reviewed by me and considered in my medical decision making (see chart for details).   Acute bacterial conjunctivitis of the left eye.  Treating with Polytrim eyedrops.  Instructed patient to follow-up with her eye care provider in 1 to 2 days if her symptoms are not improving.  Instructed her to go to the ED if she has acute eye pain, changes in her vision, or other concerning symptoms.  Patient agrees to plan of care.   Final Clinical Impressions(s) / UC Diagnoses   Final diagnoses:  Acute bacterial conjunctivitis of left eye     Discharge Instructions     Use the antibiotic eyedrops as prescribed.    Follow-up with your eye doctor for a recheck in 1 to 2 days if your symptoms are not improving.    Go to the emergency department if you have acute eye pain, changes in your vision, or other concerning symptoms.       ED Prescriptions    Medication Sig Dispense Auth. Provider   trimethoprim-polymyxin b (POLYTRIM) ophthalmic solution Place 1 drop into both eyes 4 (four) times daily for 7 days. 10 mL Sharion Balloon, NP     PDMP not reviewed this encounter.   Sharion Balloon, NP 02/29/20 1240

## 2020-02-29 NOTE — ED Triage Notes (Signed)
Pt states her left eye started itching a week ago, then last night the lower part of eye started swelling and she woke up with yellow drainage from eye today. Pt has 1+ swelling of bottom of left eye and has yellow drainage in duct of left eye.

## 2020-03-01 ENCOUNTER — Ambulatory Visit (HOSPITAL_COMMUNITY): Payer: Self-pay

## 2020-03-06 ENCOUNTER — Encounter (HOSPITAL_COMMUNITY): Payer: Self-pay

## 2020-03-06 ENCOUNTER — Other Ambulatory Visit: Payer: Self-pay

## 2020-03-06 ENCOUNTER — Ambulatory Visit (HOSPITAL_COMMUNITY)
Admission: EM | Admit: 2020-03-06 | Discharge: 2020-03-06 | Disposition: A | Discharge status: Home / Self Care | Payer: Self-pay | Attending: Emergency Medicine | Admitting: Emergency Medicine

## 2020-03-06 DIAGNOSIS — Z20822 Contact with and (suspected) exposure to covid-19: Secondary | ICD-10-CM | POA: Insufficient documentation

## 2020-03-06 DIAGNOSIS — J45909 Unspecified asthma, uncomplicated: Secondary | ICD-10-CM | POA: Insufficient documentation

## 2020-03-06 DIAGNOSIS — J029 Acute pharyngitis, unspecified: Secondary | ICD-10-CM

## 2020-03-06 DIAGNOSIS — F17210 Nicotine dependence, cigarettes, uncomplicated: Secondary | ICD-10-CM | POA: Insufficient documentation

## 2020-03-06 DIAGNOSIS — Z79899 Other long term (current) drug therapy: Secondary | ICD-10-CM | POA: Insufficient documentation

## 2020-03-06 DIAGNOSIS — R0981 Nasal congestion: Secondary | ICD-10-CM

## 2020-03-06 DIAGNOSIS — J069 Acute upper respiratory infection, unspecified: Secondary | ICD-10-CM | POA: Insufficient documentation

## 2020-03-06 DIAGNOSIS — Z21 Asymptomatic human immunodeficiency virus [HIV] infection status: Secondary | ICD-10-CM | POA: Insufficient documentation

## 2020-03-06 LAB — POCT RAPID STREP A, ED / UC: Streptococcus, Group A Screen (Direct): NEGATIVE

## 2020-03-06 MED ORDER — DM-GUAIFENESIN ER 30-600 MG PO TB12
1.0000 | ORAL_TABLET | Freq: Two times a day (BID) | ORAL | 0 refills | Status: DC
Start: 2020-03-06 — End: 2020-05-25

## 2020-03-06 MED ORDER — BENZONATATE 200 MG PO CAPS: 200.0000 mg | ORAL_CAPSULE | Freq: Three times a day (TID) | ORAL | 0 refills | Status: AC | PRN

## 2020-03-06 NOTE — ED Triage Notes (Addendum)
Pt c/o congestion, runny nose, post-nasal drip with yellow secretions, dysphagia, HA, low-grade fever with Tmax 99.3 onset yesterday.  Denies cough, n/v/d, SOB. Took cough medicine yesterday. No OTC meds today. Cervical lymph nodes slightly enlarge. Oropharynx mild erythema, tonsils mildly enlarged.

## 2020-03-06 NOTE — ED Provider Notes (Signed)
[place]    CSN: [id number] Arrival date & time: 03/06/20  1821      History   Chief Complaint Chief Complaint  Patient presents with  . Sore Throat  . Nasal Congestion    HPI Maria Nicholson is a 34 y.o. female history of HIV, asthma, presenting today for evaluation of URI symptoms.  Patient reports beginning yesterday she began to develop ear and throat itching, she began using nasal spray.  Today she has felt chills, productive cough and low-grade fevers.  She has been resting drinking fluids and has maintained normal energy level.  She denies any sick contacts.  Denies chest pain or shortness of breath.  Reports that she has felt her lymph node slightly enlarged.  Reports decreasing tobacco use from 10 cigarettes to 3 cigarettes recently.  Taking HIV medicine regularly, counts undetectable.   HPI  Past Medical History:  Diagnosis Date  . Asthma   . Bronchitis   . HIV (human immunodeficiency virus infection) ([place])   . No pertinent past medical history     Patient Active Problem List   Diagnosis Date Noted  . Tongue lesion 09/10/2019  . Vaginal itching 01/29/2019  . Healthcare maintenance 11/27/2018  . Fibroids 11/27/2018  . Human immunodeficiency virus (HIV) disease ([place]) 11/06/2017  . Eczema 11/17/2011    Past Surgical History:  Procedure Laterality Date  . DILATION AND CURETTAGE, DIAGNOSTIC / THERAPEUTIC      OB History    Gravida  3   Para  0   Term      Preterm      AB  3   Living  0     SAB  2   TAB  1   Ectopic      Multiple      Live Births               Home Medications    Prior to Admission medications   Medication Sig Start Date End Date Taking? Authorizing Provider  abacavir-lamiVUDine (EPZICOM) 600-300 MG tablet Take 1 tablet by mouth daily. 12/04/19  Yes Golden Circle, FNP  darunavir-cobicistat (PREZCOBIX) 800-150 MG tablet Take 1 tablet by mouth daily with breakfast. Swallow whole. Do NOT crush, break  or chew tablets. Take with food. 12/04/19  Yes Golden Circle, FNP  ferrous sulfate 325 (65 FE) MG tablet Take 1 tablet (325 mg total) by mouth daily. 10/07/18  Yes Larene Pickett, PA-C  fluticasone (FLONASE) 50 MCG/ACT nasal spray Place 2 sprays into both nostrils daily. 04/28/19  Yes Kirichenko, Tatyana, PA-C  cetirizine (ZYRTEC ALLERGY) 10 MG tablet Take 1 tablet (10 mg total) by mouth daily. 07/05/19 03/06/20 Yes Darr, Marguerita Beards, PA-C  benzonatate (TESSALON) 200 MG capsule Take 1 capsule (200 mg total) by mouth 3 (three) times daily as needed for up to 7 days for cough. 03/06/20 03/13/20  Mara Favero C, PA-C  dextromethorphan-guaiFENesin (MUCINEX DM) 30-600 MG 12hr tablet Take 1 tablet by mouth 2 (two) times daily. 03/06/20   Dierdre Mccalip C, PA-C  fluconazole (DIFLUCAN) 200 MG tablet Take 1 tablet (200 mg total) by mouth every 3 (three) days. 02/20/20   Shelly Bombard, MD  metroNIDAZOLE (METROGEL VAGINAL) 0.75 % vaginal gel Place 1 Applicatorful vaginally 2 (two) times daily. 02/20/20   Shelly Bombard, MD  nystatin-triamcinolone (MYCOLOG II) cream Apply to affected area daily Patient not taking: Reported on 02/20/2020 11/27/19   Orvan July, NP  trimethoprim-polymyxin b (POLYTRIM)  ophthalmic solution Place 1 drop into both eyes 4 (four) times daily for 7 days. 02/29/20 03/07/20  Sharion Balloon, NP    Family History Family History  Problem Relation Age of Onset  . Cancer Maternal Grandmother        throat  . Cancer Father 33       spinal   . Cancer Maternal Aunt 62       throat    Social History Social History   Tobacco Use  . Smoking status: Current Some Day Smoker    Packs/day: 0.50    Years: 2.00    Pack years: 1.00    Types: Cigarettes  . Smokeless tobacco: Never Used  . Tobacco comment: decreased from one pack daily     Vaping Use  . Vaping Use: Never used  Substance Use Topics  . Alcohol use: Yes    Comment: Occasionally   . Drug use: Not Currently    Types: Marijuana      Allergies   Amoxicillin and Coconut flavor   Review of Systems Review of Systems  Constitutional: Positive for chills and fatigue. Negative for activity change, appetite change and fever.  HENT: Positive for congestion, rhinorrhea, sinus pressure and sore throat. Negative for ear pain and trouble swallowing.   Eyes: Negative for discharge and redness.  Respiratory: Positive for cough. Negative for chest tightness and shortness of breath.   Cardiovascular: Negative for chest pain.  Gastrointestinal: Negative for abdominal pain, diarrhea, nausea and vomiting.  Musculoskeletal: Negative for myalgias.  Skin: Negative for rash.  Neurological: Negative for dizziness, light-headedness and headaches.     Physical Exam Triage Vital Signs ED Triage Vitals  Enc Vitals Group     BP 03/06/20 2036 132/87     Pulse Rate 03/06/20 2036 91     Resp 03/06/20 2036 18     Temp 03/06/20 2036 99.4 F (37.4 C)     Temp Source 03/06/20 2036 Oral     SpO2 03/06/20 2036 100 %     Weight --      Height --      Head Circumference --      Peak Flow --      Pain Score 03/06/20 2037 4     Pain Loc --      Pain Edu? --      Excl. in [place]? --    No data found.  Updated Vital Signs BP 132/87 (BP Location: Left Wrist)   Pulse 91   Temp 99.4 F (37.4 C) (Oral)   Resp 18   LMP 02/12/2020 (Exact Date)   SpO2 100%   Visual Acuity Right Eye Distance:   Left Eye Distance:   Bilateral Distance:    Right Eye Near:   Left Eye Near:    Bilateral Near:     Physical Exam Vitals and nursing note reviewed.  Constitutional:      Appearance: She is well-developed.     Comments: No acute distress  HENT:     Head: Normocephalic and atraumatic.     Ears:     Comments: Bilateral ears without tenderness to palpation of external auricle, tragus and mastoid, EAC's without erythema or swelling, TM's with good bony landmarks and cone of light. Non erythematous.     Nose: Nose normal.     Mouth/Throat:      Comments: Oral mucosa pink and moist, no tonsillar enlargement or exudate. Posterior pharynx patent and nonerythematous, no uvula deviation or swelling. Normal  phonation. Eyes:     Conjunctiva/sclera: Conjunctivae normal.  Cardiovascular:     Rate and Rhythm: Normal rate.  Pulmonary:     Effort: Pulmonary effort is normal. No respiratory distress.     Comments: Breathing comfortably at rest, CTABL, no wheezing, rales or other adventitious sounds auscultated Abdominal:     General: There is no distension.  Musculoskeletal:        General: Normal range of motion.     Cervical back: Neck supple.  Skin:    General: Skin is warm and dry.  Neurological:     Mental Status: She is alert and oriented to person, place, and time.      UC Treatments / Results  Labs (all labs ordered are listed, but only abnormal results are displayed) Labs Reviewed  SARS CORONAVIRUS 2 (TAT 6-24 HRS)  CULTURE, GROUP A STREP [facility])  POCT RAPID STREP A, ED / UC    EKG   Radiology No results found.  Procedures Procedures (including critical care time)  Medications Ordered in UC Medications - No data to display  Initial Impression / Assessment and Plan / UC Course  I have reviewed the triage vital signs and the nursing notes.  Pertinent labs & imaging results that were available during my care of the patient were reviewed by me and considered in my medical decision making (see chart for details).     Strep test negative, suspect likely viral URI, Covid PCR pending.  Continue symptomatic and supportive care rest and fluids.Discussed strict return precautions. Patient verbalized understanding and is agreeable with plan.  Final Clinical Impressions(s) / UC Diagnoses   Final diagnoses:  Viral URI with cough     Discharge Instructions     Strep negative, COVID pending Start daily cetirizine/zyrtec Benzonatate/tessalon as needed for cough Mucinex dm twice daily for congestion and cough  Rest and fluids Tylenol and ibuprofen as needed Follow up if any symptoms not improving or worsening     ED Prescriptions    Medication Sig Dispense Auth. Provider   benzonatate (TESSALON) 200 MG capsule Take 1 capsule (200 mg total) by mouth 3 (three) times daily as needed for up to 7 days for cough. 28 capsule Dallen Bunte C, PA-C   dextromethorphan-guaiFENesin (MUCINEX DM) 30-600 MG 12hr tablet Take 1 tablet by mouth 2 (two) times daily. 20 tablet Kariel Skillman, [place] C, PA-C     PDMP not reviewed this encounter.   Janith Lima, [place] 03/07/20 [phone]

## 2020-03-06 NOTE — Discharge Instructions (Signed)
Strep negative, COVID pending Start daily cetirizine/zyrtec Benzonatate/tessalon as needed for cough Mucinex dm twice daily for congestion and cough Rest and fluids Tylenol and ibuprofen as needed Follow up if any symptoms not improving or worsening

## 2020-03-07 LAB — SARS CORONAVIRUS 2 (TAT 6-24 HRS): SARS Coronavirus 2: NEGATIVE

## 2020-03-09 LAB — CULTURE, GROUP A STREP (THRC)

## 2020-03-12 ENCOUNTER — Ambulatory Visit (HOSPITAL_COMMUNITY)
Admission: EM | Admit: 2020-03-12 | Discharge: 2020-03-12 | Disposition: A | Discharge status: Home / Self Care | Payer: Self-pay | Attending: Urgent Care | Admitting: Urgent Care

## 2020-03-12 ENCOUNTER — Ambulatory Visit (INDEPENDENT_AMBULATORY_CARE_PROVIDER_SITE_OTHER): Payer: Self-pay

## 2020-03-12 ENCOUNTER — Encounter (HOSPITAL_COMMUNITY): Payer: Self-pay | Admitting: Emergency Medicine

## 2020-03-12 ENCOUNTER — Other Ambulatory Visit: Payer: Self-pay

## 2020-03-12 DIAGNOSIS — J3089 Other allergic rhinitis: Secondary | ICD-10-CM

## 2020-03-12 DIAGNOSIS — R05 Cough: Secondary | ICD-10-CM

## 2020-03-12 DIAGNOSIS — R059 Cough, unspecified: Secondary | ICD-10-CM

## 2020-03-12 DIAGNOSIS — J4530 Mild persistent asthma, uncomplicated: Secondary | ICD-10-CM

## 2020-03-12 DIAGNOSIS — J019 Acute sinusitis, unspecified: Secondary | ICD-10-CM

## 2020-03-12 DIAGNOSIS — J3489 Other specified disorders of nose and nasal sinuses: Secondary | ICD-10-CM

## 2020-03-12 MED ORDER — PREDNISONE 20 MG PO TABS
ORAL_TABLET | ORAL | 0 refills | Status: DC
Start: 2020-03-12 — End: 2020-03-27

## 2020-03-12 NOTE — ED Triage Notes (Addendum)
patient seen Saturday for symptoms.  Patient was instructed on otc medicines and tested for covid.    Patient believes this is a sinus infection and needs an antibiotic.  Cough is worse, productive cough-yellow and green phlegm.    covid test negative

## 2020-03-12 NOTE — ED Provider Notes (Signed)
[place]  MRN: [id number] DOB: 17-Jul-1985  Subjective:   Maria Nicholson is a 34 y.o. female presenting for 6 day hx of acute onset persistent sinus pressure, cough, chest congestion. Has HIV, has done well with her HIV management. Has albuterol inhaler but is not using it. Uses Flonase, oral antihistamine. Negative for COVID, strep. Denies fever, chest pain, shob.   No current facility-administered medications for this encounter.  Current Outpatient Medications:  .  abacavir-lamiVUDine (EPZICOM) 600-300 MG tablet, Take 1 tablet by mouth daily., Disp: 30 tablet, Rfl: 5 .  benzonatate (TESSALON) 200 MG capsule, Take 1 capsule (200 mg total) by mouth 3 (three) times daily as needed for up to 7 days for cough., Disp: 28 capsule, Rfl: 0 .  darunavir-cobicistat (PREZCOBIX) 800-150 MG tablet, Take 1 tablet by mouth daily with breakfast. Swallow whole. Do NOT crush, break or chew tablets. Take with food., Disp: 30 tablet, Rfl: 5 .  dextromethorphan-guaiFENesin (MUCINEX DM) 30-600 MG 12hr tablet, Take 1 tablet by mouth 2 (two) times daily., Disp: 20 tablet, Rfl: 0 .  ferrous sulfate 325 (65 FE) MG tablet, Take 1 tablet (325 mg total) by mouth daily., Disp: 30 tablet, Rfl: 0 .  fluconazole (DIFLUCAN) 200 MG tablet, Take 1 tablet (200 mg total) by mouth every 3 (three) days., Disp: 3 tablet, Rfl: 2 .  fluticasone (FLONASE) 50 MCG/ACT nasal spray, Place 2 sprays into both nostrils daily., Disp: 16 mL, Rfl: 2 .  metroNIDAZOLE (METROGEL VAGINAL) 0.75 % vaginal gel, Place 1 Applicatorful vaginally 2 (two) times daily., Disp: 70 g, Rfl: 5 .  nystatin-triamcinolone (MYCOLOG II) cream, Apply to affected area daily (Patient not taking: Reported on 02/20/2020), Disp: 15 g, Rfl: 0   Allergies  Allergen Reactions  . Amoxicillin Hives    Did it involve swelling of the face/tongue/throat, SOB, or low BP? No Did it involve sudden or severe rash/hives, skin peeling, or any reaction on the inside  of your mouth or nose? Yes Did you need to seek medical attention at a hospital or doctor's office? Yes When did it last happen?34 yrs old If all above answers are "NO", may proceed with cephalosporin use.   [name] Flavor Swelling    Past Medical History:  Diagnosis Date  . Asthma   . Bronchitis   . HIV (human immunodeficiency virus infection) ([place])   . No pertinent past medical history      Past Surgical History:  Procedure Laterality Date  . DILATION AND CURETTAGE, DIAGNOSTIC / THERAPEUTIC      Family History  Problem Relation Age of Onset  . Cancer Maternal Grandmother        throat  . Cancer Father 47       spinal   . Cancer Maternal Aunt 62       throat    Social History   Tobacco Use  . Smoking status: Current Some Day Smoker    Packs/day: 0.50    Years: 2.00    Pack years: 1.00    Types: Cigarettes  . Smokeless tobacco: Never Used  . Tobacco comment: decreased from one pack daily     Vaping Use  . Vaping Use: Never used  Substance Use Topics  . Alcohol use: Yes    Comment: Occasionally   . Drug use: Not Currently    Types: Marijuana    ROS   Objective:   Vitals: BP 103/72 (BP Location: Right Arm) Comment (BP Location): large cuff  Pulse 76   Temp 99 F (37.2 C) (Oral)   Resp 18   LMP 03/07/2020   SpO2 99%   Physical Exam Constitutional:      General: [name]    Appearance: Normal appearance. She is well-developed. She is not ill-appearing, toxic-appearing or diaphoretic.  HENT:     Head: Normocephalic and atraumatic.     Nose: Nose normal.     Mouth/Throat:     Mouth: Mucous membranes are moist.     Pharynx: No oropharyngeal exudate or posterior oropharyngeal erythema.  Eyes:     General: No scleral icterus.       Right eye: No discharge.        Left eye: No discharge.     Extraocular Movements: Extraocular movements intact.     Conjunctiva/sclera: Conjunctivae normal.     Pupils: Pupils are equal,  round, and reactive to light.  Cardiovascular:     Rate and Rhythm: Normal rate and regular rhythm.     Pulses: Normal pulses.     Heart sounds: Normal heart sounds. No murmur heard.  No friction rub. No gallop.   Pulmonary:     Effort: Pulmonary effort is normal. No respiratory distress.     Breath sounds: Normal breath sounds. No stridor. No wheezing, rhonchi or rales.  Skin:    General: Skin is warm and dry.     Findings: No rash.  Neurological:     General: No focal deficit present.     Mental Status: She is alert and oriented to person, place, and time.  Psychiatric:        Mood and Affect: Mood normal.        Behavior: Behavior normal.        Thought Content: Thought content normal.        Judgment: Judgment normal.    DG Chest 2 View  Result Date: 03/12/2020 CLINICAL DATA:  Cough EXAM: CHEST - 2 VIEW COMPARISON:  September 15, 2018 FINDINGS: The heart size and mediastinal contours are within normal limits. Both lungs are clear. The visualized skeletal structures are unremarkable. IMPRESSION: No active cardiopulmonary disease. Electronically Signed   By: Abelardo Diesel M.D.   On: 03/12/2020 09:21   Assessment and Plan :   PDMP not reviewed this encounter.  1. Acute sinusitis, recurrence not specified, unspecified location   2. Cough   3. Sinus pressure   4. Allergic rhinitis due to other allergic trigger, unspecified seasonality   5. Mild persistent asthma without complication     Use oral prednisone course. Hold Flonase, use antihistamine, supportive care otherwise. Start scheduling albuterol inhaler. Recheck in 2-3 days and if still symptomatic, start doxycycline to address bacterial sinusitis.  Vital signs and physical exam findings otherwise stable for outpatient management.  Counseled patient on potential for adverse effects with medications prescribed/recommended today, ER and return-to-clinic precautions discussed, patient verbalized understanding.    Jaynee Eagles,  [place] 03/12/20 [phone]

## 2020-03-12 NOTE — Discharge Instructions (Signed)
For sore throat or cough try using a honey-based tea. Use 3 teaspoons of honey with juice squeezed from half lemon. Place shaved pieces of ginger into 1/2-1 cup of water and warm over stove top. Then mix the ingredients and repeat every 4 hours as needed. Please take Tylenol 500mg -650mg  once every 6 hours for fevers, aches and pains. Hydrate very well with at least 2 liters (64 ounces) of water. Eat light meals such as soups (chicken and noodles, chicken wild rice, vegetable).  Do not eat any foods that you are allergic to.  Start an antihistamine like Zyrtec, Allegra or Claritin for postnasal drainage, sinus congestion.  You can continue using Mucinex.  I would like for you to start the steroid course for the next 5 days.  Take 2 tablets in the morning regardless.  If you still have symptoms over the weekend then come back as at this point I think you would need an antibiotic course to address bacterial sinusitis.  However, if your symptoms get better with our treatment plan then there is no need for follow-up.

## 2020-03-24 ENCOUNTER — Other Ambulatory Visit: Payer: Self-pay

## 2020-03-27 ENCOUNTER — Other Ambulatory Visit: Payer: Self-pay

## 2020-03-27 ENCOUNTER — Ambulatory Visit (HOSPITAL_COMMUNITY)
Admission: EM | Admit: 2020-03-27 | Discharge: 2020-03-27 | Disposition: A | Discharge status: Home / Self Care | Payer: HRSA Program | Attending: Internal Medicine | Admitting: Internal Medicine

## 2020-03-27 ENCOUNTER — Encounter (HOSPITAL_COMMUNITY): Payer: Self-pay | Admitting: *Deleted

## 2020-03-27 DIAGNOSIS — R059 Cough, unspecified: Secondary | ICD-10-CM | POA: Insufficient documentation

## 2020-03-27 DIAGNOSIS — F17210 Nicotine dependence, cigarettes, uncomplicated: Secondary | ICD-10-CM | POA: Insufficient documentation

## 2020-03-27 DIAGNOSIS — Z20822 Contact with and (suspected) exposure to covid-19: Secondary | ICD-10-CM | POA: Insufficient documentation

## 2020-03-27 DIAGNOSIS — J45909 Unspecified asthma, uncomplicated: Secondary | ICD-10-CM | POA: Diagnosis not present

## 2020-03-27 DIAGNOSIS — Z880 Allergy status to penicillin: Secondary | ICD-10-CM | POA: Insufficient documentation

## 2020-03-27 DIAGNOSIS — Z21 Asymptomatic human immunodeficiency virus [HIV] infection status: Secondary | ICD-10-CM | POA: Insufficient documentation

## 2020-03-27 DIAGNOSIS — Z79899 Other long term (current) drug therapy: Secondary | ICD-10-CM | POA: Diagnosis not present

## 2020-03-27 DIAGNOSIS — J019 Acute sinusitis, unspecified: Secondary | ICD-10-CM | POA: Diagnosis present

## 2020-03-27 LAB — SARS CORONAVIRUS 2 (TAT 6-24 HRS): SARS Coronavirus 2: NEGATIVE

## 2020-03-27 MED ORDER — DOXYCYCLINE HYCLATE 100 MG PO CAPS
100.0000 mg | ORAL_CAPSULE | Freq: Two times a day (BID) | ORAL | 0 refills | Status: AC
Start: 2020-03-27 — End: 2020-04-03

## 2020-03-27 NOTE — ED Triage Notes (Signed)
Pt also reports a sore throat pain 10/10

## 2020-03-27 NOTE — ED Provider Notes (Signed)
[place]    CSN: [id number] Arrival date & time: 03/27/20  0802      History   Chief Complaint Chief Complaint  Patient presents with  . Facial Pain    recent sinus infection    HPI Maria Nicholson is a 34 y.o. female comes to the urgent care with persistent nasal congestion, productive cough over the past couple of weeks.  Patient was seen here on 23 September for acute sinusitis.  Patient was treated conservatively.  According to the patient her symptoms have not improved.  She is continuing to have thick nasal discharge which is discolored, sore throat and cough which is worse at night.  She has facial pain and pressure over her forehead.  She continues to use Mucinex, Flonase with no improvement in her symptoms.  She has returned to be reevaluated.   Patient denies any fever, chills, body aches, loss of taste or smell.  She is not vaccinated against COVID-19 virus.  HPI  Past Medical History:  Diagnosis Date  . Asthma   . Bronchitis   . HIV (human immunodeficiency virus infection) ([place])   . No pertinent past medical history     Patient Active Problem List   Diagnosis Date Noted  . Tongue lesion 09/10/2019  . Vaginal itching 01/29/2019  . Healthcare maintenance 11/27/2018  . Fibroids 11/27/2018  . Human immunodeficiency virus (HIV) disease ([place]) 11/06/2017  . Eczema 11/17/2011    Past Surgical History:  Procedure Laterality Date  . DILATION AND CURETTAGE, DIAGNOSTIC / THERAPEUTIC      OB History    Gravida  3   Para  0   Term      Preterm      AB  3   Living  0     SAB  2   TAB  1   Ectopic      Multiple      Live Births               Home Medications    Prior to Admission medications   Medication Sig Start Date End Date Taking? Authorizing Provider  abacavir-lamiVUDine (EPZICOM) 600-300 MG tablet Take 1 tablet by mouth daily. 12/04/19  Yes Golden Circle, FNP  darunavir-cobicistat (PREZCOBIX) 800-150 MG tablet Take 1  tablet by mouth daily with breakfast. Swallow whole. Do NOT crush, break or chew tablets. Take with food. 12/04/19  Yes Golden Circle, FNP  dextromethorphan-guaiFENesin (MUCINEX DM) 30-600 MG 12hr tablet Take 1 tablet by mouth 2 (two) times daily. 03/06/20  Yes Wieters, Hallie C, PA-C  ferrous sulfate 325 (65 FE) MG tablet Take 1 tablet (325 mg total) by mouth daily. 10/07/18  Yes Larene Pickett, PA-C  fluticasone (FLONASE) 50 MCG/ACT nasal spray Place 2 sprays into both nostrils daily. 04/28/19  Yes Kirichenko, Tatyana, PA-C  metroNIDAZOLE (METROGEL VAGINAL) 0.75 % vaginal gel Place 1 Applicatorful vaginally 2 (two) times daily. 02/20/20  Yes Shelly Bombard, MD  nystatin-triamcinolone [facility] II) cream Apply to affected area daily 11/27/19  Yes Bast, Traci A, NP  doxycycline (VIBRAMYCIN) 100 MG capsule Take 1 capsule (100 mg total) by mouth 2 (two) times daily for 7 days. 03/27/20 04/03/20  Chase Picket, MD  cetirizine (ZYRTEC ALLERGY) 10 MG tablet Take 1 tablet (10 mg total) by mouth daily. 07/05/19 03/06/20  Darr, Marguerita Beards, PA-C    Family History Family History  Problem Relation Age of Onset  . Cancer Maternal Grandmother  throat  . Cancer Father 35       spinal   . Cancer Maternal Aunt 62       throat    Social History Social History   Tobacco Use  . Smoking status: Current Some Day Smoker    Packs/day: 0.50    Years: 2.00    Pack years: 1.00    Types: Cigarettes  . Smokeless tobacco: Never Used  . Tobacco comment: decreased from one pack daily     Vaping Use  . Vaping Use: Never used  Substance Use Topics  . Alcohol use: Yes    Comment: Occasionally   . Drug use: Not Currently    Types: Marijuana     Allergies   Amoxicillin and Coconut flavor   Review of Systems Review of Systems  Constitutional: Negative for activity change, chills and fever.  HENT: Positive for congestion, facial swelling, postnasal drip, sore throat and voice change. Negative for ear  discharge and ear pain.   Eyes: Negative for itching.  Respiratory: Positive for cough. Negative for shortness of breath.   Gastrointestinal: Negative for abdominal pain, diarrhea, rectal pain and vomiting.  Neurological: Positive for headaches.     Physical Exam Triage Vital Signs ED Triage Vitals  Enc Vitals Group     BP 03/27/20 0829 (!) 102/59     Pulse Rate 03/27/20 0829 86     Resp --      Temp 03/27/20 0829 98.9 F (37.2 C)     Temp Source 03/27/20 0829 Oral     SpO2 03/27/20 0829 100 %     Weight 03/27/20 0824 239 lb (108.4 kg)     Height 03/27/20 0824 5\' 7"  (1.702 m)     Head Circumference --      Peak Flow --      Pain Score 03/27/20 0824 10     Pain Loc --      Pain Edu? --      Excl. in [place]? --    No data found.  Updated Vital Signs BP (!) 102/59 (BP Location: Left Arm)   Pulse 86   Temp 98.9 F (37.2 C) (Oral)   Ht 5\' 7"  (1.702 m)   Wt 108.4 kg   LMP 03/07/2020   SpO2 100%   BMI 37.43 kg/m   Visual Acuity Right Eye Distance:   Left Eye Distance:   Bilateral Distance:    Right Eye Near:   Left Eye Near:    Bilateral Near:     Physical Exam   UC Treatments / Results  Labs (all labs ordered are listed, but only abnormal results are displayed) Labs Reviewed  SARS CORONAVIRUS 2 (TAT 6-24 HRS)    EKG   Radiology No results found.  Procedures Procedures (including critical care time)  Medications Ordered in UC Medications - No data to display  Initial Impression / Assessment and Plan / UC Course  I have reviewed the triage vital signs and the nursing notes.  Pertinent labs & imaging results that were available during my care of the patient were reviewed by me and considered in my medical decision making (see chart for details).    1.Acute sinusitis with symptoms greater than 10 days: Doxycycline 100 mg twice daily for 7 days Continue using Flonase, saline nasal rinse and Tylenol as needed Continue Mucinex use Return precautions  given Covid PCR test sent Please quarantine until COVID-19 results are available   Final Clinical Impressions(s) / UC Diagnoses  Final diagnoses:  Acute sinusitis with symptoms > 10 days     Discharge Instructions     Continue nasal rinse  Continue mucinex, nasal spray and as needed tylenol   ED Prescriptions    Medication Sig Dispense Auth. Provider   doxycycline (VIBRAMYCIN) 100 MG capsule Take 1 capsule (100 mg total) by mouth 2 (two) times daily for 7 days. 14 capsule Demetrick Eichenberger, Myrene Galas, MD     PDMP not reviewed this encounter.   Chase Picket, MD 03/27/20 [phone]

## 2020-03-27 NOTE — ED Triage Notes (Signed)
PT reports finishing the  steroids but still has sinus infection and is asking for anti-bx. Pt has a productive cough. Pt last seen 9-23

## 2020-03-27 NOTE — Discharge Instructions (Signed)
Continue nasal rinse  Continue mucinex, nasal spray and as needed tylenol

## 2020-04-07 ENCOUNTER — Other Ambulatory Visit: Payer: Self-pay

## 2020-04-07 DIAGNOSIS — Z113 Encounter for screening for infections with a predominantly sexual mode of transmission: Secondary | ICD-10-CM

## 2020-04-07 DIAGNOSIS — B20 Human immunodeficiency virus [HIV] disease: Secondary | ICD-10-CM

## 2020-04-08 ENCOUNTER — Ambulatory Visit (HOSPITAL_COMMUNITY)
Admission: EM | Admit: 2020-04-08 | Discharge: 2020-04-08 | Disposition: A | Discharge status: Home / Self Care | Payer: HRSA Program | Attending: Family Medicine | Admitting: Family Medicine

## 2020-04-08 ENCOUNTER — Other Ambulatory Visit: Payer: Self-pay

## 2020-04-08 ENCOUNTER — Encounter (HOSPITAL_COMMUNITY): Payer: Self-pay | Admitting: Emergency Medicine

## 2020-04-08 DIAGNOSIS — J45909 Unspecified asthma, uncomplicated: Secondary | ICD-10-CM | POA: Diagnosis not present

## 2020-04-08 DIAGNOSIS — J069 Acute upper respiratory infection, unspecified: Secondary | ICD-10-CM

## 2020-04-08 DIAGNOSIS — Z79899 Other long term (current) drug therapy: Secondary | ICD-10-CM | POA: Diagnosis not present

## 2020-04-08 DIAGNOSIS — R52 Pain, unspecified: Secondary | ICD-10-CM | POA: Insufficient documentation

## 2020-04-08 DIAGNOSIS — F17210 Nicotine dependence, cigarettes, uncomplicated: Secondary | ICD-10-CM | POA: Diagnosis not present

## 2020-04-08 DIAGNOSIS — B20 Human immunodeficiency virus [HIV] disease: Secondary | ICD-10-CM | POA: Insufficient documentation

## 2020-04-08 DIAGNOSIS — U071 COVID-19: Secondary | ICD-10-CM | POA: Insufficient documentation

## 2020-04-08 DIAGNOSIS — R110 Nausea: Secondary | ICD-10-CM | POA: Insufficient documentation

## 2020-04-08 LAB — RESP PANEL BY RT PCR (RSV, FLU A&B, COVID)
Influenza A by PCR: NEGATIVE
Influenza B by PCR: NEGATIVE
Respiratory Syncytial Virus by PCR: NEGATIVE
SARS Coronavirus 2 by RT PCR: POSITIVE — AB

## 2020-04-08 LAB — T-HELPER CELL (CD4) - (RCID CLINIC ONLY)
CD4 % Helper T Cell: 49 % (ref 33–65)
CD4 T Cell Abs: 374 /uL — ABNORMAL LOW (ref 400–1790)

## 2020-04-08 MED ORDER — ONDANSETRON 4 MG PO TBDP
4.0000 mg | ORAL_TABLET | Freq: Once | ORAL | Status: AC
  Administered 2020-04-08: 4 mg via ORAL

## 2020-04-08 MED ORDER — ONDANSETRON 4 MG PO TBDP
ORAL_TABLET | ORAL | Status: AC
  Filled 2020-04-08: qty 1

## 2020-04-08 MED ORDER — ONDANSETRON 4 MG PO TBDP
4.0000 mg | ORAL_TABLET | Freq: Three times a day (TID) | ORAL | 0 refills | Status: DC | PRN
Start: 2020-04-08 — End: 2020-05-25

## 2020-04-08 NOTE — Discharge Instructions (Signed)
Tylenol as needed.  Small frequent sips of fluids- Pedialyte, Gatorade, water, broth- to maintain hydration.   Zofran as needed for nausea and vomiting.  Complete course of antibiotics as previously prescribed.  We will retest you for covid, flu and rsv.  We will notify of you any positive findings or if any changes to treatment are needed. If normal or otherwise without concern to your results, we will not call you. Please log on to your MyChart to review your results if interested in so.   Please continue to follow up with your ID physician as scheduled.  Return if any worsening of symptoms.

## 2020-04-08 NOTE — ED Triage Notes (Addendum)
Patient presents to urgent care today with symptoms of nausea, nasal congestion with loss of smell, body aches and fever. Highest fever was 101 F. Symptoms began on Sunday. They have tried Mucinex and tessalon perles for cough with some relief of symptoms. Pt states that she was recently seen for sinus infection and is still taking her antibiotics.

## 2020-04-08 NOTE — ED Provider Notes (Signed)
[place]    CSN: [id number] Arrival date & time: 04/08/20  1506      History   Chief Complaint Chief Complaint  Patient presents with  . Nausea  . Nasal Congestion  . Generalized Body Aches  . Fever    HPI Maria Nicholson is a 34 y.o. female.   Maria Nicholson presents with complaints of fevers, nausea, body aches, cough, congestion, loss of taste and smell. Started on Sunday 10/17, she didn't feel well. Temp of 101. Nausea. Body aches. Has been taking mucinex which hasn't helped. Has been taking tessalon for a slight cough. Cough worsened by Monday 10/18, which chills as well. Yesterday- started eating again finally. However, coughing would cause vomiting. Today, can't smell or taste, and with stomach pain. Still with nausea. Vomited x2 today. Diarrhea Sunday and Monday, no further. Still urinating. Keeping fluids down. Her brother had sinus symptoms and was at her house the day before her symptoms started. Started on doxycyline on 10/8 to treat sinusitis, she filled the prescription a few days later, she did get sinus symptom relief for around a week. She has two pills left of this course. HIV positive, takes her medications regularly. States she got her blood tested yesterday and she states her cd4 count had dropped. No history of covid-19 and has not received vaccination. She doesn't work outside the home. No current chest pain. No shortness of breath .    ROS per HPI, negative if not otherwise mentioned.      Past Medical History:  Diagnosis Date  . Asthma   . Bronchitis   . HIV (human immunodeficiency virus infection) ([place])   . No pertinent past medical history     Patient Active Problem List   Diagnosis Date Noted  . Tongue lesion 09/10/2019  . Vaginal itching 01/29/2019  . Healthcare maintenance 11/27/2018  . Fibroids 11/27/2018  . Human immunodeficiency virus (HIV) disease ([place]) 11/06/2017  . Eczema 11/17/2011    Past Surgical History:   Procedure Laterality Date  . DILATION AND CURETTAGE, DIAGNOSTIC / THERAPEUTIC      OB History    Gravida  3   Para  0   Term      Preterm      AB  3   Living  0     SAB  2   TAB  1   Ectopic      Multiple      Live Births               Home Medications    Prior to Admission medications   Medication Sig Start Date End Date Taking? Authorizing Provider  abacavir-lamiVUDine (EPZICOM) 600-300 MG tablet Take 1 tablet by mouth daily. 12/04/19  Yes Golden Circle, FNP  darunavir-cobicistat (PREZCOBIX) 800-150 MG tablet Take 1 tablet by mouth daily with breakfast. Swallow whole. Do NOT crush, break or chew tablets. Take with food. 12/04/19  Yes Golden Circle, FNP  ferrous sulfate 325 (65 FE) MG tablet Take 1 tablet (325 mg total) by mouth daily. 10/07/18  Yes Larene Pickett, PA-C  fluticasone (FLONASE) 50 MCG/ACT nasal spray Place 2 sprays into both nostrils daily. 04/28/19  Yes Kirichenko, Tatyana, PA-C  dextromethorphan-guaiFENesin (MUCINEX DM) 30-600 MG 12hr tablet Take 1 tablet by mouth 2 (two) times daily. 03/06/20   Wieters, Hallie C, PA-C  metroNIDAZOLE (METROGEL VAGINAL) 0.75 % vaginal gel Place 1 Applicatorful vaginally 2 (two) times daily. 02/20/20   Baltazar Najjar  A, MD  nystatin-triamcinolone (MYCOLOG II) cream Apply to affected area daily 11/27/19   Loura Halt A, NP  ondansetron (ZOFRAN-ODT) 4 MG disintegrating tablet Take 1 tablet (4 mg total) by mouth every 8 (eight) hours as needed for nausea or vomiting. 04/08/20   Zigmund Gottron, NP  cetirizine (ZYRTEC ALLERGY) 10 MG tablet Take 1 tablet (10 mg total) by mouth daily. 07/05/19 03/06/20  Darr, Marguerita Beards, PA-C    Family History Family History  Problem Relation Age of Onset  . Cancer Maternal Grandmother        throat  . Cancer Father 72       spinal   . Cancer Maternal Aunt 62       throat    Social History Social History   Tobacco Use  . Smoking status: Current Some Day Smoker    Packs/day:  0.50    Years: 2.00    Pack years: 1.00    Types: Cigarettes  . Smokeless tobacco: Never Used  . Tobacco comment: decreased from one pack daily     Vaping Use  . Vaping Use: Never used  Substance Use Topics  . Alcohol use: Yes    Comment: Occasionally   . Drug use: Not Currently    Types: Marijuana     Allergies   Amoxicillin and Coconut flavor   Review of Systems Review of Systems   Physical Exam Triage Vital Signs ED Triage Vitals  Enc Vitals Group     BP 04/08/20 1659 105/66     Pulse Rate 04/08/20 1659 87     Resp 04/08/20 1659 19     Temp 04/08/20 1659 98.5 F (36.9 C)     Temp Source 04/08/20 1659 Oral     SpO2 04/08/20 1659 98 %     Weight --      Height --      Head Circumference --      Peak Flow --      Pain Score 04/08/20 1655 10     Pain Loc --      Pain Edu? --      Excl. in [place]? --    No data found.  Updated Vital Signs BP 105/66 (BP Location: Right Arm)   Pulse 87   Temp 98.5 F (36.9 C) (Oral)   Resp 19   LMP 04/03/2020   SpO2 98%   Visual Acuity Right Eye Distance:   Left Eye Distance:   Bilateral Distance:    Right Eye Near:   Left Eye Near:    Bilateral Near:     Physical Exam Constitutional:      General: [name]    Appearance: She is well-developed.  HENT:     Head: Normocephalic and atraumatic.     Right Ear: Tympanic membrane, ear canal and external ear normal.     Left Ear: Tympanic membrane, ear canal and external ear normal.     Nose: Nose normal.     Mouth/Throat:     Pharynx: Uvula midline.     Tonsils: No tonsillar exudate.  Eyes:     Conjunctiva/sclera: Conjunctivae normal.     Pupils: Pupils are equal, round, and reactive to light.  Cardiovascular:     Rate and Rhythm: Normal rate and regular rhythm.     Heart sounds: Normal heart sounds.  Pulmonary:     Effort: Pulmonary effort is normal.     Breath sounds: Normal breath sounds.  Skin:  General: Skin is warm and dry.   Neurological:     Mental Status: She is alert and oriented to person, place, and time.      UC Treatments / Results  Labs (all labs ordered are listed, but only abnormal results are displayed) Labs Reviewed  RESP PANEL BY RT PCR (RSV, FLU A&B, COVID) - Abnormal; Notable for the following components:      Result Value   SARS Coronavirus 2 by RT PCR POSITIVE (*)    All other components within normal limits    EKG   Radiology No results found.  Procedures Procedures (including critical care time)  Medications Ordered in UC Medications  ondansetron (ZOFRAN-ODT) disintegrating tablet 4 mg (4 mg Oral Given 04/08/20 1745)    Initial Impression / Assessment and Plan / UC Course  I have reviewed the triage vital signs and the nursing notes.  Pertinent labs & imaging results that were available during my care of the patient were reviewed by me and considered in my medical decision making (see chart for details).     Concern for covid-19, was tested 10/8 and was negative, had sinus symptoms at that time which improved with antibiotics, but now with new symptoms. Supportive cares recommended. Return precautions provided. Patient verbalized understanding and agreeable to plan.   Final Clinical Impressions(s) / UC Diagnoses   Final diagnoses:  Upper respiratory tract infection, unspecified type  Nausea  Body aches     Discharge Instructions     Tylenol as needed.  Small frequent sips of fluids- Pedialyte, Gatorade, water, broth- to maintain hydration.   Zofran as needed for nausea and vomiting.  Complete course of antibiotics as previously prescribed.  We will retest you for covid, flu and rsv.  We will notify of you any positive findings or if any changes to treatment are needed. If normal or otherwise without concern to your results, we will not call you. Please log on to your MyChart to review your results if interested in so.   Please continue to follow up with your ID  physician as scheduled.  Return if any worsening of symptoms.     ED Prescriptions    Medication Sig Dispense Auth. Provider   ondansetron (ZOFRAN-ODT) 4 MG disintegrating tablet Take 1 tablet (4 mg total) by mouth every 8 (eight) hours as needed for nausea or vomiting. 12 tablet Zigmund Gottron, NP     PDMP not reviewed this encounter.   Zigmund Gottron, NP 04/10/20 [phone]

## 2020-04-08 NOTE — ED Notes (Signed)
Obtained covid specimen and placed in lab

## 2020-04-09 ENCOUNTER — Telehealth: Payer: Self-pay | Admitting: Family

## 2020-04-09 ENCOUNTER — Other Ambulatory Visit: Payer: Self-pay | Admitting: Family

## 2020-04-09 ENCOUNTER — Telehealth (HOSPITAL_COMMUNITY): Payer: Self-pay

## 2020-04-09 ENCOUNTER — Telehealth: Payer: Self-pay | Admitting: *Deleted

## 2020-04-09 ENCOUNTER — Ambulatory Visit (HOSPITAL_COMMUNITY)
Admission: RE | Admit: 2020-04-09 | Discharge: 2020-04-09 | Disposition: A | Discharge status: Home / Self Care | Payer: Self-pay | Source: Ambulatory Visit | Attending: Pulmonary Disease | Admitting: Pulmonary Disease

## 2020-04-09 DIAGNOSIS — Z609 Problem related to social environment, unspecified: Secondary | ICD-10-CM

## 2020-04-09 DIAGNOSIS — B20 Human immunodeficiency virus [HIV] disease: Secondary | ICD-10-CM | POA: Insufficient documentation

## 2020-04-09 DIAGNOSIS — E669 Obesity, unspecified: Secondary | ICD-10-CM | POA: Insufficient documentation

## 2020-04-09 LAB — COMPREHENSIVE METABOLIC PANEL
AG Ratio: 1.2 (calc) (ref 1.0–2.5)
ALT: 13 U/L (ref 6–29)
AST: 15 U/L (ref 10–30)
Albumin: 4.1 g/dL (ref 3.6–5.1)
Alkaline phosphatase (APISO): 56 U/L (ref 31–125)
BUN: 9 mg/dL (ref 7–25)
CO2: 25 mmol/L (ref 20–32)
Calcium: 9.4 mg/dL (ref 8.6–10.2)
Chloride: 104 mmol/L (ref 98–110)
Creat: 1 mg/dL (ref 0.50–1.10)
Globulin: 3.3 g/dL (calc) (ref 1.9–3.7)
Glucose, Bld: 89 mg/dL (ref 65–99)
Potassium: 4 mmol/L (ref 3.5–5.3)
Sodium: 137 mmol/L (ref 135–146)
Total Bilirubin: 0.2 mg/dL (ref 0.2–1.2)
Total Protein: 7.4 g/dL (ref 6.1–8.1)

## 2020-04-09 LAB — HIV-1 RNA QUANT-NO REFLEX-BLD
HIV 1 RNA Quant: 33 Copies/mL — ABNORMAL HIGH
HIV-1 RNA Quant, Log: 1.51 Log cps/mL — ABNORMAL HIGH

## 2020-04-09 LAB — RPR: RPR Ser Ql: NONREACTIVE

## 2020-04-09 MED ORDER — SODIUM CHLORIDE 0.9 % IV SOLN: Freq: Once | INTRAVENOUS | Status: AC

## 2020-04-09 MED ORDER — ALBUTEROL SULFATE HFA 108 (90 BASE) MCG/ACT IN AERS: 2.0000 | INHALATION_SPRAY | Freq: Once | RESPIRATORY_TRACT | Status: DC | PRN

## 2020-04-09 MED ORDER — SODIUM CHLORIDE 0.9 % IV SOLN: INTRAVENOUS | Status: DC | PRN

## 2020-04-09 MED ORDER — FAMOTIDINE IN NACL 20-0.9 MG/50ML-% IV SOLN: 20.0000 mg | Freq: Once | INTRAVENOUS | Status: DC | PRN

## 2020-04-09 MED ORDER — DIPHENHYDRAMINE HCL 50 MG/ML IJ SOLN: 50.0000 mg | Freq: Once | INTRAMUSCULAR | Status: DC | PRN

## 2020-04-09 MED ORDER — METHYLPREDNISOLONE SODIUM SUCC 125 MG IJ SOLR: 125.0000 mg | Freq: Once | INTRAMUSCULAR | Status: DC | PRN

## 2020-04-09 MED ORDER — EPINEPHRINE 0.3 MG/0.3ML IJ SOAJ: 0.3000 mg | Freq: Once | INTRAMUSCULAR | Status: DC | PRN

## 2020-04-09 NOTE — Telephone Encounter (Signed)
Called to Discuss with patient about Covid symptoms and the use of the monoclonal antibody infusion for those with mild to moderate Covid symptoms and at a high risk of hospitalization.     Pt appears to qualify for this infusion due to co-morbid conditions and/or a member of an at-risk group in accordance with the FDA Emergency Use Authorization.    Maria Nicholson was seen at the [facility] urgent care center on 10/20 with fever, nausea, body aches, and cough.  Symptom onset was 10/17.  Covid positive test on 04/16/2020.  Qualifying risk factors include BMI greater than 25, HIV disease, and high social risk vulnerability score.   I spoke with Maria Nicholson regarding the risks, benefits, and financial information associated with monoclonal antibody therapy.  She wishes to continue with treatment at this time.  Hello Maria Nicholson,   You have been scheduled to receive the monoclonal antibody therapy at [facility]: 04/09/20 @ 2:30pm  If you have been tested outside of a [facility] - you MUST bring a copy of your positive test with you the morning of your appointment. You may take a photo of this and upload to your MyChart portal or have the testing facility fax the result to [phone]    The address for the infusion clinic site is:  --GPS address is [address] - the parking is located near [employer] building where you will see  COVID19 Infusion feather banner marking the entrance to parking.   (see photos below)            --Enter into the 2nd entrance where the "wave, flag banner" is at the road. Turn into this 2nd entrance and immediately turn left to park in 1 of the 5 parking spots.   --Please stay in your car and call the desk for assistance inside [phone].   --Average time in department is roughly 90 minutes to 2 hours for monoclonal treatment - this includes preparation of the medication, IV start and the required 1 hour monitoring after the infusion.     Should you develop worsening shortness of breath, chest pain or severe breathing problems please do not wait for this appointment and go to the Emergency room for evaluation and treatment. You will undergo another oxygen screen before your infusion to ensure this is the best treatment option for you. There is a chance that the best decision may be to send you to the Emergency Room for evaluation at the time of your appointment.   The day of your visit you should: Marland Kitchen Get plenty of rest the night before and drink plenty of water . Eat a light meal/snack before coming and take your medications as prescribed  . Wear warm, comfortable clothes with a shirt that can roll-up over the elbow (will need IV start).  . Wear a mask  . Consider bringing some activity to help pass the time   The medication itself is free, paid for by government COVID funding relief, and many commercial insurers are waiving bills related to COVID treatment, however some have sent bills to patients later for the administration fees related to the medication.  Please contact your insurance agent to discuss prior to your appointment if you would like further details about billing specific to your policy.    The CPT code is [phone] for your reference.    I hope this helps find you feeling better,  Mauricio Po

## 2020-04-09 NOTE — Progress Notes (Signed)
I connected by phone with Maria Nicholson on 04/09/2020 at 1:23 PM to discuss the potential use of a new treatment for mild to moderate COVID-19 viral infection in non-hospitalized patients.  This patient is a 34 y.o. female that meets the FDA criteria for Emergency Use Authorization of COVID monoclonal antibody casirivimab/imdevimab or bamlanivimab/eteseviamb.  Has a (+) direct SARS-CoV-2 viral test result  Has mild or moderate COVID-19   Is NOT hospitalized due to COVID-19  Is within 10 days of symptom onset  Has at least one of the high risk factor(s) for progression to severe COVID-19 and/or hospitalization as defined in EUA.  Specific high risk criteria : BMI > 25, Immunosuppressive Disease or Treatment and Other high risk medical condition per CDC:  high social risk vulnerability score   I have spoken and communicated the following to the patient or parent/caregiver regarding COVID monoclonal antibody treatment:  1. FDA has authorized the emergency use for the treatment of mild to moderate COVID-19 in adults and pediatric patients with positive results of direct SARS-CoV-2 viral testing who are 59 years of age and older weighing at least 40 kg, and who are at high risk for progressing to severe COVID-19 and/or hospitalization.  2. The significant known and potential risks and benefits of COVID monoclonal antibody, and the extent to which such potential risks and benefits are unknown.  3. Information on available alternative treatments and the risks and benefits of those alternatives, including clinical trials.  4. Patients treated with COVID monoclonal antibody should continue to self-isolate and use infection control measures (e.g., wear mask, isolate, social distance, avoid sharing personal items, clean and disinfect "high touch" surfaces, and frequent handwashing) according to CDC guidelines.   5. The patient or parent/caregiver has the option to accept or refuse COVID monoclonal  antibody treatment.  After reviewing this information with the patient, the patient has agreed to receive one of the available covid 19 monoclonal antibodies and will be provided an appropriate fact sheet prior to infusion.   Mauricio Po, FNP 04/09/2020 1:23 PM

## 2020-04-09 NOTE — Discharge Instructions (Signed)

## 2020-04-09 NOTE — Telephone Encounter (Signed)
Patient called to ask Maria Nicholson questions about the monoclonal infusion. RN let her know that Maria Nicholson is actually one of the providers screening patients for the infusion.  This made Maria Nicholson feel relieved. Maria Nicholson has added her to the list  Landis Gandy, RN

## 2020-04-09 NOTE — Progress Notes (Signed)
  Diagnosis: COVID-19  Physician: Dr. Joya Gaskins  Procedure: Covid Infusion Clinic Med: bamlanivimab\etesevimab infusion - Provided patient with bamlanimivab\etesevimab fact sheet for patients, parents and caregivers prior to infusion.  Complications: No immediate complications noted.  Discharge: Discharged home   Winfred Leeds 04/09/2020

## 2020-04-09 NOTE — Telephone Encounter (Signed)
Called to Discuss with patient about Covid symptoms and the use of the monoclonal antibody infusion for those with mild to moderate Covid symptoms and at a high risk of hospitalization.     Pt appears to qualify for this infusion due to co-morbid conditions and/or a member of an at-risk group in accordance with the FDA Emergency Use Authorization.

## 2020-04-21 ENCOUNTER — Ambulatory Visit (INDEPENDENT_AMBULATORY_CARE_PROVIDER_SITE_OTHER): Payer: Self-pay | Admitting: Family

## 2020-04-21 ENCOUNTER — Encounter: Payer: Self-pay | Admitting: Family

## 2020-04-21 ENCOUNTER — Other Ambulatory Visit: Payer: Self-pay

## 2020-04-21 VITALS — BP 94/64 | HR 83 | Temp 98.2°F | Ht 67.0 in | Wt 260.0 lb

## 2020-04-21 DIAGNOSIS — B20 Human immunodeficiency virus [HIV] disease: Secondary | ICD-10-CM

## 2020-04-21 DIAGNOSIS — Z0000 Encounter for general adult medical examination without abnormal findings: Secondary | ICD-10-CM

## 2020-04-21 DIAGNOSIS — Z79899 Other long term (current) drug therapy: Secondary | ICD-10-CM

## 2020-04-21 NOTE — Progress Notes (Signed)
Subjective:    Patient ID: Maria Nicholson, female    DOB: Jul 09, 1985, 33 y.o.   MRN: [id number]  Chief Complaint  Patient presents with  . New Patient (Initial Visit)    declined condoms     HPI:  Maria Nicholson is a 34 y.o. female with HIV disease who was last seen in the office on 12/04/2019 with good adherence and tolerance to her ART regimen of Prezcobix and Epzicom.  Viral load at the time was undetectable with CD4 count of 701.  Most recent blood work completed on 04/07/2020 with a viral load that remains undetectable and CD4 count of 374.  She subsequently contracted COVID-19 and was infused with monoclonal antibodies.  Here today for routine follow-up.  Maria Nicholson continues to take her Prezcobix and Epzicom daily as prescribed with no adverse side effects or missed doses since her last office visit.  Overall she is feeling well today with no new concerns/complaints.  She is grateful for receiving the monoclonal antibodies as they have made significant amounts of improvement in her symptoms and the way she feels. Denies fevers, chills, night sweats, headaches, changes in vision, neck pain/stiffness, nausea, diarrhea, vomiting, lesions or rashes.  Maria Nicholson is no problems obtaining her medication from the pharmacy and remains covered through [employer].  Denies feelings of being down, depressed, or hopeless recently.  No recreational or illicit drug use.  She is a social smoker and drinks alcohol on occasion.  Declines vaccinations including influenza today.  Due for routine dental care.    Allergies  Allergen Reactions  . Amoxicillin Hives    Did it involve swelling of the face/tongue/throat, SOB, or low BP? No Did it involve sudden or severe rash/hives, skin peeling, or any reaction on the inside of your mouth or nose? Yes Did you need to seek medical attention at a hospital or doctor's office? Yes When did it last happen?34 yrs old If all above answers are "NO", may proceed  with cephalosporin use.   [name] Flavor Swelling      Outpatient Medications Prior to Visit  Medication Sig Dispense Refill  . abacavir-lamiVUDine (EPZICOM) 600-300 MG tablet Take 1 tablet by mouth daily. 30 tablet 5  . darunavir-cobicistat (PREZCOBIX) 800-150 MG tablet Take 1 tablet by mouth daily with breakfast. Swallow whole. Do NOT crush, break or chew tablets. Take with food. 30 tablet 5  . dextromethorphan-guaiFENesin (MUCINEX DM) 30-600 MG 12hr tablet Take 1 tablet by mouth 2 (two) times daily. 20 tablet 0  . ferrous sulfate 325 (65 FE) MG tablet Take 1 tablet (325 mg total) by mouth daily. 30 tablet 0  . fluticasone (FLONASE) 50 MCG/ACT nasal spray Place 2 sprays into both nostrils daily. 16 mL 2  . metroNIDAZOLE (METROGEL VAGINAL) 0.75 % vaginal gel Place 1 Applicatorful vaginally 2 (two) times daily. 70 g 5  . nystatin-triamcinolone (MYCOLOG II) cream Apply to affected area daily 15 g 0  . ondansetron (ZOFRAN-ODT) 4 MG disintegrating tablet Take 1 tablet (4 mg total) by mouth every 8 (eight) hours as needed for nausea or vomiting. 12 tablet 0   No facility-administered medications prior to visit.     Past Medical History:  Diagnosis Date  . Asthma   . Bronchitis   . HIV (human immunodeficiency virus infection) ([place])   . No pertinent past medical history      Past Surgical History:  Procedure Laterality Date  . DILATION AND CURETTAGE, DIAGNOSTIC / THERAPEUTIC  Review of Systems  Constitutional: Negative for appetite change, chills, diaphoresis, fatigue, fever and unexpected weight change.  Eyes:       Negative for acute change in vision  Respiratory: Negative for chest tightness, shortness of breath and wheezing.   Cardiovascular: Negative for chest pain.  Gastrointestinal: Negative for diarrhea, nausea and vomiting.  Genitourinary: Negative for dysuria, pelvic pain and vaginal discharge.  Musculoskeletal: Negative for neck pain and neck stiffness.   Skin: Negative for rash.  Neurological: Negative for seizures, syncope, weakness and headaches.  Hematological: Negative for adenopathy. Does not bruise/bleed easily.  Psychiatric/Behavioral: Negative for hallucinations.      Objective:    BP 94/64   Pulse 83   Temp 98.2 F (36.8 C) (Oral)   Ht 5\' 7"  (1.702 m)   Wt 260 lb (117.9 kg)   LMP 04/03/2020   SpO2 100%   BMI 40.72 kg/m  Nursing note and vital signs reviewed.  Physical Exam Constitutional:      General: [name]    Appearance: She is well-developed.  Eyes:     Conjunctiva/sclera: Conjunctivae normal.  Cardiovascular:     Rate and Rhythm: Normal rate and regular rhythm.     Heart sounds: Normal heart sounds. No murmur heard.  No friction rub. No gallop.   Pulmonary:     Effort: Pulmonary effort is normal. No respiratory distress.     Breath sounds: Normal breath sounds. No wheezing or rales.  Chest:     Chest wall: No tenderness.  Abdominal:     General: Bowel sounds are normal.     Palpations: Abdomen is soft.     Tenderness: There is no abdominal tenderness.  Musculoskeletal:     Cervical back: Neck supple.  Lymphadenopathy:     Cervical: No cervical adenopathy.  Skin:    General: Skin is warm and dry.     Findings: No rash.  Neurological:     Mental Status: She is alert and oriented to person, place, and time.  Psychiatric:        Behavior: Behavior normal.        Thought Content: Thought content normal.        Judgment: Judgment normal.      Depression screen [facility] 2/9 04/21/2020 04/15/2019 01/29/2019 11/06/2017 11/18/2011  Decreased Interest 0 0 0 3 0  Down, Depressed, Hopeless 0 0 0 3 0  PHQ - 2 Score 0 0 0 6 0       Assessment & Plan:    Patient Active Problem List   Diagnosis Date Noted  . Tongue lesion 09/10/2019  . Vaginal itching 01/29/2019  . Healthcare maintenance 11/27/2018  . Fibroids 11/27/2018  . Human immunodeficiency virus (HIV) disease ([place]) 11/06/2017   . Eczema 11/17/2011     Problem List Items Addressed This Visit    None       I am having [name] maintain her ferrous sulfate, fluticasone, nystatin-triamcinolone, abacavir-lamiVUDine, Prezcobix, metroNIDAZOLE, dextromethorphan-guaiFENesin, and ondansetron.   No orders of the defined types were placed in this encounter.    Follow-up: No follow-ups on file.   Terri Piedra, MSN, FNP-C Nurse Practitioner [facility] [place] number: [phone]

## 2020-04-21 NOTE — Patient Instructions (Signed)
Nice to see.   We will continue your current dose of Epizcom and Prezcobix.   Refills will be sent to the pharmacy.   Plan for follow up in 3 months or sooner if needed with lab work on the same day.   Have a great day and stay safe!

## 2020-04-22 MED ORDER — ABACAVIR SULFATE-LAMIVUDINE 600-300 MG PO TABS: 1.0000 | ORAL_TABLET | Freq: Every day | ORAL | 5 refills | Status: DC

## 2020-04-22 MED ORDER — PREZCOBIX 800-150 MG PO TABS: 1.0000 | ORAL_TABLET | Freq: Every day | ORAL | 5 refills | Status: DC

## 2020-04-22 NOTE — Assessment & Plan Note (Signed)
Maria Nicholson continues to have well-controlled HIV disease with good adherence and tolerance to her ART regimen of Epzicom and Prezcobix.  No signs/symptoms of opportunistic infection or progressive HIV disease.  We reviewed her lab work and discussed plan of care.  Continue current dose of Epzicom and Prezcobix.  Will need to renew ADAP in January.  Plan for follow-up in 3 months or sooner if needed with lab work 1 to 2 weeks prior to appointment.

## 2020-04-22 NOTE — Assessment & Plan Note (Signed)
   Discussed importance of safe sexual practice to reduce risk of STI.  Condoms declined.  Interested in receiving Covid vaccine however she must wait 90 days after receiving monoclonal antibody treatment.  Routine dental care due with [facility] information provided.   Pap smear up-to-date per recommendations.

## 2020-05-25 ENCOUNTER — Encounter (HOSPITAL_COMMUNITY): Payer: Self-pay

## 2020-05-25 ENCOUNTER — Ambulatory Visit (HOSPITAL_COMMUNITY)
Admission: EM | Admit: 2020-05-25 | Discharge: 2020-05-25 | Disposition: A | Discharge status: Home / Self Care | Payer: Self-pay | Attending: Internal Medicine | Admitting: Internal Medicine

## 2020-05-25 ENCOUNTER — Other Ambulatory Visit: Payer: Self-pay

## 2020-05-25 DIAGNOSIS — Z21 Asymptomatic human immunodeficiency virus [HIV] infection status: Secondary | ICD-10-CM | POA: Insufficient documentation

## 2020-05-25 DIAGNOSIS — Z79899 Other long term (current) drug therapy: Secondary | ICD-10-CM | POA: Insufficient documentation

## 2020-05-25 DIAGNOSIS — B9789 Other viral agents as the cause of diseases classified elsewhere: Secondary | ICD-10-CM | POA: Insufficient documentation

## 2020-05-25 DIAGNOSIS — Z20822 Contact with and (suspected) exposure to covid-19: Secondary | ICD-10-CM | POA: Insufficient documentation

## 2020-05-25 DIAGNOSIS — F17210 Nicotine dependence, cigarettes, uncomplicated: Secondary | ICD-10-CM | POA: Insufficient documentation

## 2020-05-25 DIAGNOSIS — R051 Acute cough: Secondary | ICD-10-CM | POA: Insufficient documentation

## 2020-05-25 DIAGNOSIS — J218 Acute bronchiolitis due to other specified organisms: Secondary | ICD-10-CM | POA: Insufficient documentation

## 2020-05-25 DIAGNOSIS — R0789 Other chest pain: Secondary | ICD-10-CM | POA: Insufficient documentation

## 2020-05-25 DIAGNOSIS — Z8616 Personal history of COVID-19: Secondary | ICD-10-CM | POA: Insufficient documentation

## 2020-05-25 DIAGNOSIS — Z880 Allergy status to penicillin: Secondary | ICD-10-CM | POA: Insufficient documentation

## 2020-05-25 LAB — RESP PANEL BY RT-PCR (FLU A&B, COVID) ARPGX2
Influenza A by PCR: NEGATIVE
Influenza B by PCR: NEGATIVE
SARS Coronavirus 2 by RT PCR: NEGATIVE

## 2020-05-25 MED ORDER — BENZONATATE 100 MG PO CAPS: 100.0000 mg | ORAL_CAPSULE | Freq: Three times a day (TID) | ORAL | 0 refills | Status: DC

## 2020-05-25 MED ORDER — IBUPROFEN 600 MG PO TABS: 600.0000 mg | ORAL_TABLET | Freq: Four times a day (QID) | ORAL | 0 refills | Status: AC | PRN

## 2020-05-25 NOTE — ED Triage Notes (Addendum)
Pt presents with nasal congestion and cough x 2 days. Robitussin gives some relief. Denies fever.  Pt reports she tested positive COVID October 2021.

## 2020-05-25 NOTE — Discharge Instructions (Signed)
Please take medications as prescribed Please check your results using MyChart We will call you with significant lab results and recommended treatments.

## 2020-05-25 NOTE — ED Provider Notes (Signed)
[facility]    CSN: [id number] Arrival date & time: 05/25/20  1134      History   Chief Complaint Chief Complaint  Patient presents with  . Nasal Congestion  . Cough    HPI Maria Nicholson is a 34 y.o. female comes to the urgent care with complaints of nasal congestion cough of 2 days duration.  Patient states onset of symptoms was fairly sudden and has been persistent.  She has tried Robitussin with no improvement.  She denies any wheezing.  She has some shortness of breath with activity.   No febrile episodes.  She admits to having sick contact.  Patient tested positive for COVID-19 infection in October 2021.  She received monoclonal antibody infusion and is due to vaccinated in January 2022.  Patient has some chest tightness but no chest pain or chest pressure.  No nausea, vomiting or diarrhea.  HPI  Past Medical History:  Diagnosis Date  . Asthma   . Bronchitis   . HIV (human immunodeficiency virus infection) ([place])   . No pertinent past medical history     Patient Active Problem List   Diagnosis Date Noted  . Tongue lesion 09/10/2019  . Vaginal itching 01/29/2019  . Healthcare maintenance 11/27/2018  . Fibroids 11/27/2018  . Human immunodeficiency virus (HIV) disease ([place]) 11/06/2017  . Eczema 11/17/2011    Past Surgical History:  Procedure Laterality Date  . DILATION AND CURETTAGE, DIAGNOSTIC / THERAPEUTIC      OB History    Gravida  3   Para  0   Term      Preterm      AB  3   Living  0     SAB  2   TAB  1   Ectopic      Multiple      Live Births               Home Medications    Prior to Admission medications   Medication Sig Start Date End Date Taking? Authorizing Provider  abacavir-lamiVUDine (EPZICOM) 600-300 MG tablet Take 1 tablet by mouth daily. 04/22/20   Golden Circle, FNP  benzonatate (TESSALON) 100 MG capsule Take 1 capsule (100 mg total) by mouth every 8 (eight) hours. 05/25/20   Toriana Sponsel, Myrene Galas, MD    darunavir-cobicistat (PREZCOBIX) 800-150 MG tablet Take 1 tablet by mouth daily with breakfast. Swallow whole. Do NOT crush, break or chew tablets. Take with food. 04/22/20   Golden Circle, FNP  ferrous sulfate 325 (65 FE) MG tablet Take 1 tablet (325 mg total) by mouth daily. 10/07/18   Larene Pickett, PA-C  fluticasone (FLONASE) 50 MCG/ACT nasal spray Place 2 sprays into both nostrils daily. 04/28/19   Kirichenko, Tatyana, PA-C  ibuprofen (ADVIL) 600 MG tablet Take 1 tablet (600 mg total) by mouth every 6 (six) hours as needed. 05/25/20   Dixie Jafri, Myrene Galas, MD  metroNIDAZOLE (METROGEL VAGINAL) 0.75 % vaginal gel Place 1 Applicatorful vaginally 2 (two) times daily. 02/20/20   Shelly Bombard, MD  nystatin-triamcinolone (MYCOLOG II) cream Apply to affected area daily 11/27/19   Loura Halt A, NP  cetirizine (ZYRTEC ALLERGY) 10 MG tablet Take 1 tablet (10 mg total) by mouth daily. 07/05/19 03/06/20  Darr, Edison Nasuti, PA-C    Family History Family History  Problem Relation Age of Onset  . Cancer Maternal Grandmother        throat  . Cancer Father 35  spinal   . Cancer Maternal Aunt 62       throat    Social History Social History   Tobacco Use  . Smoking status: Current Some Day Smoker    Packs/day: 0.50    Years: 2.00    Pack years: 1.00    Types: Cigarettes  . Smokeless tobacco: Never Used  . Tobacco comment: decreased from one pack daily     Vaping Use  . Vaping Use: Never used  Substance Use Topics  . Alcohol use: Yes    Comment: Occasionally   . Drug use: Not Currently    Types: Marijuana     Allergies   Amoxicillin and Coconut flavor   Review of Systems Review of Systems  Constitutional: Positive for fatigue.  HENT: Positive for congestion and rhinorrhea.   Respiratory: Positive for cough, chest tightness and shortness of breath. Negative for choking and wheezing.   Cardiovascular: Negative for chest pain.  Gastrointestinal: Negative.   Neurological: Negative.       Physical Exam Triage Vital Signs ED Triage Vitals  Enc Vitals Group     BP 05/25/20 1345 132/85     Pulse Rate 05/25/20 1345 (!) 105     Resp 05/25/20 1345 (!) 27     Temp 05/25/20 1345 99.3 F (37.4 C)     Temp Source 05/25/20 1345 Oral     SpO2 05/25/20 1345 99 %     Weight --      Height --      Head Circumference --      Peak Flow --      Pain Score 05/25/20 1343 0     Pain Loc --      Pain Edu? --      Excl. in [place]? --    No data found.  Updated Vital Signs BP 118/78 (BP Location: Right Arm)   Pulse 100   Temp 99.3 F (37.4 C) (Oral)   Resp (!) 22   LMP 04/29/2020 (Exact Date)   SpO2 100%   Visual Acuity Right Eye Distance:   Left Eye Distance:   Bilateral Distance:    Right Eye Near:   Left Eye Near:    Bilateral Near:     Physical Exam Vitals and nursing note reviewed.  Constitutional:      Appearance: She is ill-appearing.  HENT:     Right Ear: Tympanic membrane normal.  Cardiovascular:     Rate and Rhythm: Normal rate and regular rhythm.     Pulses: Normal pulses.     Heart sounds: Normal heart sounds. No murmur heard.  No gallop.   Pulmonary:     Effort: Respiratory distress present.     Breath sounds: No wheezing or rhonchi.  Chest:     Chest wall: No tenderness.  Abdominal:     General: Bowel sounds are normal.     Palpations: Abdomen is soft.  Musculoskeletal:        General: Normal range of motion.     Cervical back: Normal range of motion.  Lymphadenopathy:     Cervical: No cervical adenopathy.  Skin:    General: Skin is warm and dry.     Capillary Refill: Capillary refill takes less than 2 seconds.  Neurological:     Mental Status: She is alert.      UC Treatments / Results  Labs (all labs ordered are listed, but only abnormal results are displayed) Labs Reviewed  RESP PANEL BY RT-PCR (FLU  A&B, COVID) [id number]    EKG   Radiology No results found.  Procedures Procedures (including critical care  time)  Medications Ordered in UC Medications - No data to display  Initial Impression / Assessment and Plan / UC Course  I have reviewed the triage vital signs and the nursing notes.  Pertinent labs & imaging results that were available during my care of the patient were reviewed by me and considered in my medical decision making (see chart for details).     1.  Acute viral bronchitis: Respiratory PCR for Covid/influenza Tessalon Perles as needed for cough Ibuprofen as needed for body aches Return precautions given Patient is advised to quarantine until COVID-19 test results are available. Final Clinical Impressions(s) / UC Diagnoses   Final diagnoses:  Acute viral bronchiolitis     Discharge Instructions     Please take medications as prescribed Please check your results using MyChart We will call you with significant lab results and recommended treatments.   ED Prescriptions    Medication Sig Dispense Auth. Provider   benzonatate (TESSALON) 100 MG capsule Take 1 capsule (100 mg total) by mouth every 8 (eight) hours. 21 capsule Lilliam Chamblee, Myrene Galas, MD   ibuprofen (ADVIL) 600 MG tablet Take 1 tablet (600 mg total) by mouth every 6 (six) hours as needed. 30 tablet Shevon Sian, Myrene Galas, MD     PDMP not reviewed this encounter.   Chase Picket, MD 05/25/20 [phone]

## 2020-07-22 ENCOUNTER — Other Ambulatory Visit: Payer: Self-pay

## 2020-07-22 ENCOUNTER — Encounter: Payer: Self-pay | Admitting: Family

## 2020-07-22 ENCOUNTER — Ambulatory Visit (INDEPENDENT_AMBULATORY_CARE_PROVIDER_SITE_OTHER): Payer: Self-pay | Admitting: Family

## 2020-07-22 VITALS — Temp 98.0°F | Wt 257.8 lb

## 2020-07-22 DIAGNOSIS — Z0000 Encounter for general adult medical examination without abnormal findings: Secondary | ICD-10-CM

## 2020-07-22 DIAGNOSIS — Z113 Encounter for screening for infections with a predominantly sexual mode of transmission: Secondary | ICD-10-CM

## 2020-07-22 DIAGNOSIS — Z79899 Other long term (current) drug therapy: Secondary | ICD-10-CM

## 2020-07-22 DIAGNOSIS — B20 Human immunodeficiency virus [HIV] disease: Secondary | ICD-10-CM

## 2020-07-22 MED ORDER — PREZCOBIX 800-150 MG PO TABS: 1.0000 | ORAL_TABLET | Freq: Every day | ORAL | 5 refills | Status: DC

## 2020-07-22 MED ORDER — ABACAVIR SULFATE-LAMIVUDINE 600-300 MG PO TABS: 1.0000 | ORAL_TABLET | Freq: Every day | ORAL | 5 refills | Status: DC

## 2020-07-22 NOTE — Assessment & Plan Note (Signed)
Ms. Yaw continues to have well-controlled HIV disease with good adherence and tolerance to her ART regimen of Epzicom and Prezcobix.  No signs/symptoms of opportunistic infection or progressive HIV disease.  We reviewed previous lab work and discussed plan of care.  Continue current dose of Epzicom and Prezcobix.  Check blood work today.  Plan for follow-up in 3 months or sooner if needed with lab work on the same day.

## 2020-07-22 NOTE — Patient Instructions (Addendum)
Nice to see you.  Continue Prezcobix and Epizicom daily.  Refills are at the pharmacy.  Schedule an appointment to renew financial assistance.  Plan for follow up in 3 months or sooner if needed with lab work on the same day.   Have a great day and stay safe!

## 2020-07-22 NOTE — Progress Notes (Signed)
Subjective:    Patient ID: Maria Nicholson, female    DOB: [birth year], 35 y.o.   MRN: [id number]  Chief Complaint  Patient presents with  . Follow-up     HPI:  Maria Nicholson is a 35 y.o. female with HIV disease last seen on 04/21/2020 with well-controlled virus and good adherence and tolerance to her ART regimen of Epzicom and Prezcobix.  Viral load at the time was undetectable CD4 count of 374.  Diagnosed with Covid in the interim and treated with monoclonal antibodies.  Here today for routine follow-up.  [name] continues to take her Epzicom and Prezcobix daily as prescribed with no adverse side effects or missed doses since her last office visit.  Overall feeling well today with no new concerns/complaints. Denies fevers, chills, night sweats, headaches, changes in vision, neck pain/stiffness, nausea, diarrhea, vomiting, lesions or rashes.  [name] continues to have no problems obtaining medication from the pharmacy.  Denies feelings of being down, depressed, or hopeless recently.  No recreational or illicit drug use with occasional alcohol consumption and approximately 1/2 pack of cigarettes per day.  She is due for routine dental care.  Condoms provided per request.  Declines flu shot.   Allergies  Allergen Reactions  . Amoxicillin Hives    Did it involve swelling of the face/tongue/throat, SOB, or low BP? No Did it involve sudden or severe rash/hives, skin peeling, or any reaction on the inside of your mouth or nose? Yes Did you need to seek medical attention at a hospital or doctor's office? Yes When did it last happen?35 yrs old If all above answers are "NO", may proceed with cephalosporin use.   [name] Flavor Swelling      Outpatient Medications Prior to Visit  Medication Sig Dispense Refill  . ferrous sulfate 325 (65 FE) MG tablet Take 1 tablet (325 mg total) by mouth daily. 30 tablet 0  . ibuprofen (ADVIL) 600 MG tablet Take 1 tablet (600 mg total) by  mouth every 6 (six) hours as needed. 30 tablet 0  . metroNIDAZOLE (METROGEL VAGINAL) 0.75 % vaginal gel Place 1 Applicatorful vaginally 2 (two) times daily. 70 g 5  . abacavir-lamiVUDine (EPZICOM) 600-300 MG tablet Take 1 tablet by mouth daily. 30 tablet 5  . benzonatate (TESSALON) 100 MG capsule Take 1 capsule (100 mg total) by mouth every 8 (eight) hours. 21 capsule 0  . darunavir-cobicistat (PREZCOBIX) 800-150 MG tablet Take 1 tablet by mouth daily with breakfast. Swallow whole. Do NOT crush, break or chew tablets. Take with food. 30 tablet 5  . fluticasone (FLONASE) 50 MCG/ACT nasal spray Place 2 sprays into both nostrils daily. 16 mL 2  . nystatin-triamcinolone (MYCOLOG II) cream Apply to affected area daily 15 g 0   No facility-administered medications prior to visit.     Past Medical History:  Diagnosis Date  . Asthma   . Bronchitis   . HIV (human immunodeficiency virus infection) ([place])   . No pertinent past medical history      Past Surgical History:  Procedure Laterality Date  . DILATION AND CURETTAGE, DIAGNOSTIC / THERAPEUTIC         Review of Systems  Constitutional: Negative for appetite change, chills, diaphoresis, fatigue, fever and unexpected weight change.  Eyes:       Negative for acute change in vision  Respiratory: Negative for chest tightness, shortness of breath and wheezing.   Cardiovascular: Negative for chest pain.  Gastrointestinal: Negative for diarrhea, nausea  and vomiting.  Genitourinary: Negative for dysuria, pelvic pain and vaginal discharge.  Musculoskeletal: Negative for neck pain and neck stiffness.  Skin: Negative for rash.  Neurological: Negative for seizures, syncope, weakness and headaches.  Hematological: Negative for adenopathy. Does not bruise/bleed easily.  Psychiatric/Behavioral: Negative for hallucinations.      Objective:    Temp 98 F (36.7 C) (Oral)   Wt 257 lb 12.8 oz (116.9 kg)   BMI 40.38 kg/m  Nursing note and vital  signs reviewed.  Physical Exam Constitutional:      General: [name]    Appearance: She is well-developed.  HENT:     Mouth/Throat:     Mouth: Oropharynx is clear and moist.  Eyes:     Conjunctiva/sclera: Conjunctivae normal.  Cardiovascular:     Rate and Rhythm: Normal rate and regular rhythm.     Pulses: Intact distal pulses.     Heart sounds: Normal heart sounds. No murmur heard. No friction rub. No gallop.   Pulmonary:     Effort: Pulmonary effort is normal. No respiratory distress.     Breath sounds: Normal breath sounds. No wheezing or rales.  Chest:     Chest wall: No tenderness.  Abdominal:     General: Bowel sounds are normal.     Palpations: Abdomen is soft.     Tenderness: There is no abdominal tenderness.  Musculoskeletal:     Cervical back: Neck supple.  Lymphadenopathy:     Cervical: No cervical adenopathy.  Skin:    General: Skin is warm and dry.     Findings: No rash.  Neurological:     Mental Status: She is alert and oriented to person, place, and time.  Psychiatric:        Mood and Affect: Mood and affect normal.        Behavior: Behavior normal.        Thought Content: Thought content normal.        Judgment: Judgment normal.      Depression screen [facility] 2/9 04/21/2020 04/15/2019 01/29/2019 11/06/2017 11/18/2011  Decreased Interest 0 0 0 3 0  Down, Depressed, Hopeless 0 0 0 3 0  PHQ - 2 Score 0 0 0 6 0       Assessment & Plan:    Patient Active Problem List   Diagnosis Date Noted  . Tongue lesion 09/10/2019  . Vaginal itching 01/29/2019  . Healthcare maintenance 11/27/2018  . Fibroids 11/27/2018  . Human immunodeficiency virus (HIV) disease ([place]) 11/06/2017  . Eczema 11/17/2011     Problem List Items Addressed This Visit      Other   Human immunodeficiency virus (HIV) disease ([place])    [name] continues to have well-controlled HIV disease with good adherence and tolerance to her ART regimen of Epzicom and  Prezcobix.  No signs/symptoms of opportunistic infection or progressive HIV disease.  We reviewed previous lab work and discussed plan of care.  Continue current dose of Epzicom and Prezcobix.  Check blood work today.  Plan for follow-up in 3 months or sooner if needed with lab work on the same day.      Relevant Medications   abacavir-lamiVUDine (EPZICOM) 600-300 MG tablet   darunavir-cobicistat (PREZCOBIX) 800-150 MG tablet   Other Relevant Orders   COMPLETE METABOLIC PANEL WITH GFR   HIV-1 RNA quant-no reflex-bld   T-helper cell (CD4)- ([facility] clinic only)   Healthcare maintenance - Primary     Due for routine  dental care with referral placed to [place] [facility]  Discussed importance of safe sexual practice to reduce risk of STI.  Condoms provided.  Due for routine cervical cancer screening encouraged to follow-up with gynecology and/or Pap clinic.       Other Visit Diagnoses    Pharmacologic therapy       Relevant Orders   Lipid panel   Screening for STDs (sexually transmitted diseases)       Relevant Orders   RPR       I have discontinued Caryl Pina T. Rawson's fluticasone, nystatin-triamcinolone, and benzonatate. I am also having her maintain her ferrous sulfate, metroNIDAZOLE, ibuprofen, abacavir-lamiVUDine, and Prezcobix.   Meds ordered this encounter  Medications  . abacavir-lamiVUDine (EPZICOM) 600-300 MG tablet    Sig: Take 1 tablet by mouth daily.    Dispense:  30 tablet    Refill:  5    Order Specific Question:   Supervising Provider    Answer:   Carlyle Basques [[id number]]  . darunavir-cobicistat (PREZCOBIX) 800-150 MG tablet    Sig: Take 1 tablet by mouth daily with breakfast. Swallow whole. Do NOT crush, break or chew tablets. Take with food.    Dispense:  30 tablet    Refill:  5    Order Specific Question:   Supervising Provider    Answer:   Carlyle Basques [[id number]]     Follow-up: Return in about 3 months (around 10/19/2020), or if symptoms worsen or fail to  improve.   Terri Piedra, MSN, FNP-C Nurse Practitioner [facility] for [facility] [place] number: [phone]

## 2020-07-22 NOTE — Assessment & Plan Note (Signed)
   Due for routine dental care with referral placed to [place] [facility]  Discussed importance of safe sexual practice to reduce risk of STI.  Condoms provided.  Due for routine cervical cancer screening encouraged to follow-up with gynecology and/or Pap clinic.

## 2020-07-23 LAB — T-HELPER CELL (CD4) - (RCID CLINIC ONLY)
CD4 % Helper T Cell: 41 % (ref 33–65)
CD4 T Cell Abs: 628 /uL (ref 400–1790)

## 2020-07-24 LAB — LIPID PANEL
Cholesterol: 155 mg/dL (ref <200)
HDL: 46 mg/dL — ABNORMAL LOW (ref >=50)
LDL Cholesterol (Calc): 92 mg/dL (calc)
Non-HDL Cholesterol (Calc): 109 mg/dL (calc) (ref <130)
Total CHOL/HDL Ratio: 3.4 (calc) (ref <5.0)
Triglycerides: 82 mg/dL (ref <150)

## 2020-07-24 LAB — COMPLETE METABOLIC PANEL WITH GFR
AG Ratio: 1.5 (calc) (ref 1.0–2.5)
ALT: 11 U/L (ref 6–29)
AST: 11 U/L (ref 10–30)
Albumin: 4.1 g/dL (ref 3.6–5.1)
Alkaline phosphatase (APISO): 63 U/L (ref 31–125)
BUN: 13 mg/dL (ref 7–25)
CO2: 24 mmol/L (ref 20–32)
Calcium: 9.3 mg/dL (ref 8.6–10.2)
Chloride: 107 mmol/L (ref 98–110)
Creat: 0.77 mg/dL (ref 0.50–1.10)
GFR, Est African American: 118 mL/min/{1.73_m2} (ref >=60)
GFR, Est Non African American: 102 mL/min/{1.73_m2} (ref >=60)
Globulin: 2.7 g/dL (calc) (ref 1.9–3.7)
Glucose, Bld: 100 mg/dL — ABNORMAL HIGH (ref 65–99)
Potassium: 3.9 mmol/L (ref 3.5–5.3)
Sodium: 139 mmol/L (ref 135–146)
Total Bilirubin: 0.2 mg/dL (ref 0.2–1.2)
Total Protein: 6.8 g/dL (ref 6.1–8.1)

## 2020-07-24 LAB — HIV-1 RNA QUANT-NO REFLEX-BLD
HIV 1 RNA Quant: 27 Copies/mL — ABNORMAL HIGH
HIV-1 RNA Quant, Log: 1.43 Log cps/mL — ABNORMAL HIGH

## 2020-07-24 LAB — RPR: RPR Ser Ql: NONREACTIVE

## 2020-07-29 ENCOUNTER — Other Ambulatory Visit: Payer: Self-pay

## 2020-07-29 ENCOUNTER — Ambulatory Visit: Payer: Self-pay

## 2020-08-11 ENCOUNTER — Encounter: Payer: Self-pay | Admitting: Family

## 2020-10-07 ENCOUNTER — Other Ambulatory Visit: Payer: Self-pay | Admitting: Family

## 2020-10-07 DIAGNOSIS — B20 Human immunodeficiency virus [HIV] disease: Secondary | ICD-10-CM

## 2020-10-08 ENCOUNTER — Telehealth: Payer: Self-pay

## 2020-10-08 NOTE — Telephone Encounter (Signed)
Called patient to get a follow up scheduled, voicemail was full

## 2020-10-08 NOTE — Telephone Encounter (Signed)
Patient called office today stating [facility] has no refills on her Epzicom. Per chart patient received refills on 07/22/20 with five additional refills.  Spoke with pharmacy tech at [facility] who states they have refills on both Epizcom and Prezcobix. Requested pharmacy to go ahead and refill medication and contact patient.  Patient was notified of this and will call [facility] in one hour.  Leatrice Jewels, RMA

## 2020-10-29 ENCOUNTER — Encounter: Payer: Self-pay | Admitting: Family

## 2020-11-12 ENCOUNTER — Encounter: Payer: Self-pay | Admitting: Family

## 2020-11-12 ENCOUNTER — Ambulatory Visit (INDEPENDENT_AMBULATORY_CARE_PROVIDER_SITE_OTHER): Payer: Self-pay | Admitting: Family

## 2020-11-12 ENCOUNTER — Telehealth: Payer: Self-pay

## 2020-11-12 ENCOUNTER — Other Ambulatory Visit: Payer: Self-pay

## 2020-11-12 VITALS — BP 117/85 | HR 76 | Resp 16 | Ht 67.0 in | Wt 255.0 lb

## 2020-11-12 DIAGNOSIS — B20 Human immunodeficiency virus [HIV] disease: Secondary | ICD-10-CM

## 2020-11-12 DIAGNOSIS — Z79899 Other long term (current) drug therapy: Secondary | ICD-10-CM

## 2020-11-12 DIAGNOSIS — N898 Other specified noninflammatory disorders of vagina: Secondary | ICD-10-CM

## 2020-11-12 MED ORDER — FLUCONAZOLE 150 MG PO TABS: 150.0000 mg | ORAL_TABLET | Freq: Once | ORAL | 0 refills | Status: AC

## 2020-11-12 NOTE — Telephone Encounter (Signed)
Patient here for labs and and a walk-in complaining of vaginal burning. She has appointment with OBGYN tomorrow, but does not qualify for medicaid so would have to pay $150 for visit. Scheduled patient for open slot with Terri Piedra, NP today at 3:15pm.   Beryle Flock, RN

## 2020-11-12 NOTE — Progress Notes (Signed)
Subjective:    Patient ID: Maria Nicholson, female    DOB: February 16, 1986, 35 y.o.   MRN: [id number]  Chief Complaint  Patient presents with  . Vaginal Itching     HPI:  Maria Nicholson is a 35 y.o. female with well controlled HIV disease presents today for an acute office visit.    Maria Nicholson has been experiencing an itchy, tingling sensation located in her vaginal area that started about 3 days ago. Is using metronidazole cream after her menstural cycle per Gynecology. Used a new vaginal wash recently that is pH balanced. No recent antibiotics. No fevers/chills. Has been sexually active and has been unprotected. No current urinary symptoms although describes urine as dark colored.   Allergies  Allergen Reactions  . Amoxicillin Hives    Did it involve swelling of the face/tongue/throat, SOB, or low BP? No Did it involve sudden or severe rash/hives, skin peeling, or any reaction on the inside of your mouth or nose? Yes Did you need to seek medical attention at a hospital or doctor's office? Yes When did it last happen?35 yrs old If all above answers are "NO", may proceed with cephalosporin use.   [name] Flavor Swelling      Outpatient Medications Prior to Visit  Medication Sig Dispense Refill  . abacavir-lamiVUDine (EPZICOM) 600-300 MG tablet Take 1 tablet by mouth daily. 30 tablet 5  . darunavir-cobicistat (PREZCOBIX) 800-150 MG tablet Take 1 tablet by mouth daily with breakfast. Swallow whole. Do NOT crush, break or chew tablets. Take with food. 30 tablet 5  . ferrous sulfate 325 (65 FE) MG tablet Take 1 tablet (325 mg total) by mouth daily. 30 tablet 0  . ibuprofen (ADVIL) 600 MG tablet Take 1 tablet (600 mg total) by mouth every 6 (six) hours as needed. 30 tablet 0  . metroNIDAZOLE (METROGEL VAGINAL) 0.75 % vaginal gel Place 1 Applicatorful vaginally 2 (two) times daily. 70 g 5   No facility-administered medications prior to visit.     Past Medical History:   Diagnosis Date  . Asthma   . Bronchitis   . HIV (human immunodeficiency virus infection) ([place])   . No pertinent past medical history      Past Surgical History:  Procedure Laterality Date  . DILATION AND CURETTAGE, DIAGNOSTIC / THERAPEUTIC         Review of Systems  Constitutional: Negative for chills, diaphoresis, fatigue and fever.  Respiratory: Negative for cough, chest tightness, shortness of breath and wheezing.   Cardiovascular: Negative for chest pain.  Gastrointestinal: Negative for abdominal pain, diarrhea, nausea and vomiting.  Genitourinary: Negative for vaginal discharge and vaginal pain.       Vaginal itching.      Objective:    BP 117/85   Pulse 76   Resp 16   Ht 5\' 7"  (1.702 m)   Wt 255 lb (115.7 kg)   LMP 11/01/2020 (Exact Date)   SpO2 96%   BMI 39.94 kg/m  Nursing note and vital signs reviewed.  Physical Exam Constitutional:      General: [name]    Appearance: She is well-developed.  Genitourinary:    Comments: Genital exam deferred.  Skin:    General: Skin is warm and dry.  Neurological:     Mental Status: She is alert.      Depression screen [facility] 2/9 11/12/2020 04/21/2020 04/15/2019 01/29/2019 11/06/2017  Decreased Interest 0 0 0 0 3  Down, Depressed, Hopeless 0  0 0 0 3  PHQ - 2 Score 0 0 0 0 6       Assessment & Plan:    Patient Active Problem List   Diagnosis Date Noted  . Tongue lesion 09/10/2019  . Vaginal itching 01/29/2019  . Healthcare maintenance 11/27/2018  . Fibroids 11/27/2018  . Human immunodeficiency virus (HIV) disease ([place]) 11/06/2017  . Eczema 11/17/2011     Problem List Items Addressed This Visit      Musculoskeletal and Integument   Vaginal itching - Primary    Maria Nicholson has symptoms that are consistent with candida vaginalis. Will check vaginal swab for BV, GC/chlamydia and trichamonas. One time dose of fluconazole 150mg  sent to pharmacy. Additional treatment as needed pending lab  work results. Recommend follow up with Gynecology if symptoms worsen or do not improve.       Relevant Orders   Cytology (oral, anal, urethral) ancillary only   Urinalysis (Completed)       I am having Maria Nicholson start on fluconazole. I am also having her maintain her ferrous sulfate, metroNIDAZOLE, ibuprofen, abacavir-lamiVUDine, and Prezcobix.   Meds ordered this encounter  Medications  . fluconazole (DIFLUCAN) 150 MG tablet    Sig: Take 1 tablet (150 mg total) by mouth once for 1 dose. May repeat in 72 hours if needed.    Dispense:  1 tablet    Refill:  0    Order Specific Question:   Supervising Provider    Answer:   Carlyle Basques [[id number]]     Follow-up: If symptoms worsen or do not improve.    Terri Piedra, MSN, FNP-C Nurse Practitioner [facility] [place] number: [phone]

## 2020-11-12 NOTE — Patient Instructions (Signed)
Nice to see you.  We will check your swabs today.   Fluconazole has been sent to the pharmacy. May repeat if 72 hours if needed.   Have a great day and stay safe!

## 2020-11-13 ENCOUNTER — Ambulatory Visit: Payer: Self-pay | Admitting: Obstetrics

## 2020-11-13 ENCOUNTER — Encounter: Payer: Self-pay | Admitting: Family

## 2020-11-13 LAB — URINALYSIS
Bilirubin Urine: NEGATIVE
Glucose, UA: NEGATIVE
Hgb urine dipstick: NEGATIVE
Ketones, ur: NEGATIVE
Leukocytes,Ua: NEGATIVE
Nitrite: NEGATIVE
Protein, ur: NEGATIVE
Specific Gravity, Urine: 1.017 (ref 1.001–1.035)
pH: 6 (ref 5.0–8.0)

## 2020-11-13 LAB — CYTOLOGY, (ORAL, ANAL, URETHRAL) ANCILLARY ONLY
Chlamydia: NEGATIVE
Comment: NEGATIVE
Comment: NEGATIVE
Comment: NORMAL
Neisseria Gonorrhea: NEGATIVE
Trichomonas: NEGATIVE

## 2020-11-13 NOTE — Assessment & Plan Note (Signed)
Ms. Ferrer has symptoms that are consistent with candida vaginalis. Will check vaginal swab for BV, GC/chlamydia and trichamonas. One time dose of fluconazole 150mg  sent to pharmacy. Additional treatment as needed pending lab work results. Recommend follow up with Gynecology if symptoms worsen or do not improve.

## 2020-11-14 LAB — COMPREHENSIVE METABOLIC PANEL
AG Ratio: 1.4 (calc) (ref 1.0–2.5)
ALT: 10 U/L (ref 6–29)
AST: 11 U/L (ref 10–30)
Albumin: 4 g/dL (ref 3.6–5.1)
Alkaline phosphatase (APISO): 62 U/L (ref 31–125)
BUN: 9 mg/dL (ref 7–25)
CO2: 25 mmol/L (ref 20–32)
Calcium: 9.4 mg/dL (ref 8.6–10.2)
Chloride: 106 mmol/L (ref 98–110)
Creat: 0.92 mg/dL (ref 0.50–1.10)
Globulin: 2.9 g/dL (calc) (ref 1.9–3.7)
Glucose, Bld: 89 mg/dL (ref 65–99)
Potassium: 4.3 mmol/L (ref 3.5–5.3)
Sodium: 137 mmol/L (ref 135–146)
Total Bilirubin: 0.2 mg/dL (ref 0.2–1.2)
Total Protein: 6.9 g/dL (ref 6.1–8.1)

## 2020-11-14 LAB — LIPID PANEL
Cholesterol: 156 mg/dL (ref <200)
HDL: 43 mg/dL — ABNORMAL LOW (ref >=50)
LDL Cholesterol (Calc): 94 mg/dL (calc)
Non-HDL Cholesterol (Calc): 113 mg/dL (calc) (ref <130)
Total CHOL/HDL Ratio: 3.6 (calc) (ref <5.0)
Triglycerides: 96 mg/dL (ref <150)

## 2020-11-14 LAB — HIV-1 RNA QUANT-NO REFLEX-BLD
HIV 1 RNA Quant: <20 Copies/mL — ABNORMAL HIGH
HIV-1 RNA Quant, Log: <1.3 Log cps/mL — ABNORMAL HIGH

## 2020-12-01 ENCOUNTER — Other Ambulatory Visit: Payer: Self-pay

## 2020-12-01 ENCOUNTER — Encounter: Payer: Self-pay | Admitting: Family

## 2020-12-01 ENCOUNTER — Telehealth (INDEPENDENT_AMBULATORY_CARE_PROVIDER_SITE_OTHER): Payer: Self-pay | Admitting: Family

## 2020-12-01 DIAGNOSIS — Z0000 Encounter for general adult medical examination without abnormal findings: Secondary | ICD-10-CM

## 2020-12-01 DIAGNOSIS — B20 Human immunodeficiency virus [HIV] disease: Secondary | ICD-10-CM

## 2020-12-01 MED ORDER — ABACAVIR SULFATE-LAMIVUDINE 600-300 MG PO TABS: 1.0000 | ORAL_TABLET | Freq: Every day | ORAL | 5 refills | Status: DC

## 2020-12-01 MED ORDER — PREZCOBIX 800-150 MG PO TABS: 1.0000 | ORAL_TABLET | Freq: Every day | ORAL | 5 refills | Status: DC

## 2020-12-01 NOTE — Assessment & Plan Note (Signed)
Ms. Yingling continues to have well-controlled HIV disease with good adherence and tolerance to her ART regimen Prezcobix and Epzicom.  We reviewed lab work and discussed plan of care.  No current signs/symptoms of opportunistic infection or progressive HIV.  Continue current dose of Epzicom and Prezcobix.  Plan for follow-up in 4 months or sooner if needed with lab work 1 to 2 weeks prior to appointment.

## 2020-12-01 NOTE — Progress Notes (Signed)
Subjective:    Patient ID: Maria Nicholson, female    DOB: [birth year], 35 y.o.   MRN: [id number]  Chief Complaint  Patient presents with   Follow-up     Virtual Visit via Telephone/Video Note   I connected with Ms. Kathan on 12/01/2020 at 11:45 am by video and verified that I am speaking with the correct person using two identifiers.   I discussed the limitations, risks, security and privacy concerns of performing an evaluation and management service by telephone and the availability of in person appointments. I also discussed with the patient that there may be a patient responsible charge related to this service. The patient expressed understanding and agreed to proceed.  Location:  Patient: Home Provider: Clinic   HPI:  EDITA Nicholson is a 35 y.o. female with HIV disease last seen for HIV care on 07/22/2020 with well-controlled virus and good adherence and tolerance to her ART regimen of Epzicom and Prezcobix.  Lab work with viral load that was undetectable and CD4 count of 628.  Most recent lab work completed on 11/12/2020 with viral load remains undetectable.  Kidney function, liver function, electrolytes within normal ranges.  Lipid profile with triglycerides of 96, LDL of 94, and HDL of 43.  Here today for routine follow-up.  Ms. Pardi continues to take her Epzicom and Prezcobix daily as prescribed with no adverse side effects or missed doses.  Overall feeling well today with no new concerns/complaints. Denies fevers, chills, night sweats, headaches, changes in vision, neck pain/stiffness, nausea, diarrhea, vomiting, lesions or rashes.  Maria Nicholson has no problems obtaining medication from the pharmacy.  Denies feelings of being down, depressed, or hopeless recently.  No recreational or illicit drug use, tobacco use, or alcohol consumption.  Routine dental care is up-to-date per recommendations.   Allergies  Allergen Reactions   Amoxicillin Hives    Did it involve swelling of  the face/tongue/throat, SOB, or low BP? No Did it involve sudden or severe rash/hives, skin peeling, or any reaction on the inside of your mouth or nose? Yes Did you need to seek medical attention at a hospital or doctor's office? Yes When did it last happen?      35 yrs old If all above answers are "NO", may proceed with cephalosporin use.    Coconut Flavor Swelling      Outpatient Medications Prior to Visit  Medication Sig Dispense Refill   ferrous sulfate 325 (65 FE) MG tablet Take 1 tablet (325 mg total) by mouth daily. 30 tablet 0   ibuprofen (ADVIL) 600 MG tablet Take 1 tablet (600 mg total) by mouth every 6 (six) hours as needed. 30 tablet 0   metroNIDAZOLE (METROGEL VAGINAL) 0.75 % vaginal gel Place 1 Applicatorful vaginally 2 (two) times daily. 70 g 5   abacavir-lamiVUDine (EPZICOM) 600-300 MG tablet Take 1 tablet by mouth daily. 30 tablet 5   darunavir-cobicistat (PREZCOBIX) 800-150 MG tablet Take 1 tablet by mouth daily with breakfast. Swallow whole. Do NOT crush, break or chew tablets. Take with food. 30 tablet 5   No facility-administered medications prior to visit.     Past Medical History:  Diagnosis Date   Asthma    Bronchitis    HIV (human immunodeficiency virus infection) ([place])    No pertinent past medical history      Past Surgical History:  Procedure Laterality Date   DILATION AND CURETTAGE, DIAGNOSTIC / THERAPEUTIC         Review of Systems  Constitutional:  Negative for appetite change, chills, diaphoresis, fatigue, fever and unexpected weight change.  Eyes:        Negative for acute change in vision  Respiratory:  Negative for chest tightness, shortness of breath and wheezing.   Cardiovascular:  Negative for chest pain.  Gastrointestinal:  Negative for diarrhea, nausea and vomiting.  Genitourinary:  Negative for dysuria, pelvic pain and vaginal discharge.  Musculoskeletal:  Negative for neck pain and neck stiffness.  Skin:  Negative for rash.   Neurological:  Negative for seizures, syncope, weakness and headaches.  Hematological:  Negative for adenopathy. Does not bruise/bleed easily.  Psychiatric/Behavioral:  Negative for hallucinations.      Objective:    Nursing note and vital signs reviewed.    Maria Nicholson appears to be doing well and in no apparent distress. Pleasant to speak with. Physical exam limited secondary to telehealth visit.  Assessment & Plan:   Problem List Items Addressed This Visit       Other   Human immunodeficiency virus (HIV) disease ([place])    Ms. Schutter continues to have well-controlled HIV disease with good adherence and tolerance to her ART regimen Prezcobix and Epzicom.  We reviewed lab work and discussed plan of care.  No current signs/symptoms of opportunistic infection or progressive HIV.  Continue current dose of Epzicom and Prezcobix.  Plan for follow-up in 4 months or sooner if needed with lab work 1 to 2 weeks prior to appointment.       Relevant Medications   abacavir-lamiVUDine (EPZICOM) 600-300 MG tablet   darunavir-cobicistat (PREZCOBIX) 800-150 MG tablet   Healthcare maintenance    Discussed importance of safe sexual practice to reduce risk of STI.  Condoms declined. Routine dental care is up-to-date per recommendations.         I am having Maria Nicholson maintain her ferrous sulfate, metroNIDAZOLE, ibuprofen, abacavir-lamiVUDine, and Prezcobix.   Meds ordered this encounter  Medications   abacavir-lamiVUDine (EPZICOM) 600-300 MG tablet    Sig: Take 1 tablet by mouth daily.    Dispense:  30 tablet    Refill:  5    Order Specific Question:   Supervising Provider    Answer:   Baxter Flattery, CYNTHIA [[id number]]   darunavir-cobicistat (PREZCOBIX) 800-150 MG tablet    Sig: Take 1 tablet by mouth daily with breakfast. Swallow whole. Do NOT crush, break or chew tablets. Take with food.    Dispense:  30 tablet    Refill:  5    Order Specific Question:   Supervising Provider    Answer:    Carlyle Basques [phone]     I discussed the assessment and treatment plan with the patient. The patient was provided an opportunity to ask questions and all were answered. The patient agreed with the plan and demonstrated an understanding of the instructions.   The patient was advised to call back or seek an in-person evaluation if the symptoms worsen or if the condition fails to improve as anticipated.   I provided 12  minutes of non-face-to-face time during this encounter.  Follow-up: Return in about 4 months (around 04/02/2021), or if symptoms worsen or fail to improve.   Terri Piedra, MSN, FNP-C Nurse Practitioner [facility] [place] number: [phone]

## 2020-12-01 NOTE — Patient Instructions (Addendum)
Nice to speak with you.  Continue to take your medications as prescribed.   Please call [facility]) to schedule/follow up on your dental care at [phone]  Plan for follow up in 4 months or sooner if needed with lab work 1-2 weeks prior to appointment.   Have a great day and stay safe!

## 2020-12-01 NOTE — Assessment & Plan Note (Signed)
   Discussed importance of safe sexual practice to reduce risk of STI.  Condoms declined.  Routine dental care is up-to-date per recommendations.

## 2021-03-15 ENCOUNTER — Other Ambulatory Visit: Payer: Self-pay

## 2021-03-15 DIAGNOSIS — Z113 Encounter for screening for infections with a predominantly sexual mode of transmission: Secondary | ICD-10-CM

## 2021-03-15 DIAGNOSIS — B20 Human immunodeficiency virus [HIV] disease: Secondary | ICD-10-CM

## 2021-03-16 ENCOUNTER — Emergency Department (HOSPITAL_COMMUNITY)
Admission: EM | Admit: 2021-03-16 | Discharge: 2021-03-17 | Disposition: A | Discharge status: Home / Self Care | Payer: Self-pay | Attending: Emergency Medicine | Admitting: Emergency Medicine

## 2021-03-16 ENCOUNTER — Other Ambulatory Visit: Payer: Self-pay

## 2021-03-16 DIAGNOSIS — F17210 Nicotine dependence, cigarettes, uncomplicated: Secondary | ICD-10-CM | POA: Insufficient documentation

## 2021-03-16 DIAGNOSIS — R0981 Nasal congestion: Secondary | ICD-10-CM

## 2021-03-16 DIAGNOSIS — J321 Chronic frontal sinusitis: Secondary | ICD-10-CM

## 2021-03-16 DIAGNOSIS — J45909 Unspecified asthma, uncomplicated: Secondary | ICD-10-CM | POA: Insufficient documentation

## 2021-03-16 DIAGNOSIS — Z21 Asymptomatic human immunodeficiency virus [HIV] infection status: Secondary | ICD-10-CM | POA: Insufficient documentation

## 2021-03-16 DIAGNOSIS — Z20822 Contact with and (suspected) exposure to covid-19: Secondary | ICD-10-CM | POA: Insufficient documentation

## 2021-03-16 DIAGNOSIS — Z79899 Other long term (current) drug therapy: Secondary | ICD-10-CM | POA: Insufficient documentation

## 2021-03-16 DIAGNOSIS — J0110 Acute frontal sinusitis, unspecified: Secondary | ICD-10-CM | POA: Insufficient documentation

## 2021-03-16 LAB — RESP PANEL BY RT-PCR (FLU A&B, COVID) ARPGX2
Influenza A by PCR: NEGATIVE
Influenza B by PCR: NEGATIVE
SARS Coronavirus 2 by RT PCR: NEGATIVE

## 2021-03-16 NOTE — ED Provider Notes (Signed)
Emergency Medicine Provider Triage Evaluation Note  Maria Nicholson , a 35 y.o. female  was evaluated in triage.  Pt complains of cough and congestion that started yesterday.  She had a fever at home to 100.4.  Denies any sore throat or body aches. She states that she has had COVID in the past and this does not feel anything like that.  She has seasonal sinus infections that usually occur every fall also this is what she thinks this is.  This is usually treated with an antibiotic.  She denies chest pain or shortness of breath.  Review of Systems  Positive: Congestion, fever, headache Negative: Sore throat, shortness of breath, chest pain  Physical Exam  BP 122/70 (BP Location: Left Arm)   Pulse 89   Temp 98.7 F (37.1 C) (Oral)   Resp 18   SpO2 99%  Gen:   Awake, no distress  Resp:  Normal effort.  Lungs clear to auscultation MSK:   Moves extremities without difficulty  Other:  Some sinus tenderness to palpation of sinuses  Medical Decision Making  Medically screening exam initiated at 7:11 PM.  Appropriate orders placed.  Maria Nicholson was informed that the remainder of the evaluation will be completed by another provider, this initial triage assessment does not replace that evaluation, and the importance of remaining in the ED until their evaluation is complete.     Adolphus Birchwood, PA-C 03/16/21 [place], [place], DO 03/16/21 2150

## 2021-03-16 NOTE — ED Triage Notes (Signed)
Pt reports cough, headache, and fever since yesterday. T max 100.4.

## 2021-03-17 ENCOUNTER — Emergency Department (HOSPITAL_COMMUNITY): Payer: Self-pay

## 2021-03-17 MED ORDER — DOXYCYCLINE HYCLATE 100 MG PO CAPS: 100.0000 mg | ORAL_CAPSULE | Freq: Two times a day (BID) | ORAL | 0 refills | Status: DC

## 2021-03-17 MED ORDER — CETIRIZINE HCL 10 MG PO TABS: 10.0000 mg | ORAL_TABLET | Freq: Every day | ORAL | 0 refills | Status: AC | PRN

## 2021-03-17 NOTE — ED Provider Notes (Signed)
[facility] EMERGENCY DEPARTMENT Provider Note   CSN: [id number] Arrival date & time: 03/16/21  1857     History Chief Complaint  Patient presents with   Cough   Fever    Maria Nicholson is a 35 y.o. female with a history of HIV last quant undectable, asthma, and fibroids who presents to the ED with complaints of URI sxs x 3-4 days now. Patient reports congestion, sinus pain/pressure, throat irritation from coughing, cough productive of yellow phlegm sputum, and some chest congestion.  She feels like she wheezes occasionally.  No alleviating or aggravating factors to her symptoms.  She did have a low-grade fever the other day.  She denies chest pain, dyspnea, hemoptysis, syncope, abdominal pain, nausea, vomiting, or diarrhea.  HPI     Past Medical History:  Diagnosis Date   Asthma    Bronchitis    HIV (human immunodeficiency virus infection) ([place])    No pertinent past medical history     Patient Active Problem List   Diagnosis Date Noted   Tongue lesion 09/10/2019   Vaginal itching 01/29/2019   Healthcare maintenance 11/27/2018   Fibroids 11/27/2018   Human immunodeficiency virus (HIV) disease ([place]) 11/06/2017   Eczema 11/17/2011    Past Surgical History:  Procedure Laterality Date   DILATION AND CURETTAGE, DIAGNOSTIC / THERAPEUTIC       OB History     Gravida  3   Para  0   Term  0   Preterm  0   AB  3   Living         SAB  2   IAB  1   Ectopic  0   Multiple      Live Births              Family History  Problem Relation Age of Onset   Cancer Maternal Grandmother        throat   Cancer Father 63       spinal    Cancer Maternal Aunt 62       throat    Social History   Tobacco Use   Smoking status: Light Smoker    Packs/day: 0.15    Years: 2.00    Pack years: 0.30    Types: Cigarettes   Smokeless tobacco: Never   Tobacco comments:    decreased from one pack daily     Vaping Use   Vaping Use: Never used   Substance Use Topics   Alcohol use: Yes    Comment: Occasionally    Drug use: Not Currently    Types: Marijuana    Home Medications Prior to Admission medications   Medication Sig Start Date End Date Taking? Authorizing Provider  abacavir-lamiVUDine (EPZICOM) 600-300 MG tablet Take 1 tablet by mouth daily. 12/01/20   [place], FNP  darunavir-cobicistat (PREZCOBIX) 800-150 MG tablet Take 1 tablet by mouth daily with breakfast. Swallow whole. Do NOT crush, break or chew tablets. Take with food. 12/01/20   [place], FNP  ferrous sulfate 325 (65 FE) MG tablet Take 1 tablet (325 mg total) by mouth daily. 10/07/18   Larene Pickett, PA-C  ibuprofen (ADVIL) 600 MG tablet Take 1 tablet (600 mg total) by mouth every 6 (six) hours as needed. 05/25/20   Lamptey, Myrene Galas, MD  metroNIDAZOLE (METROGEL VAGINAL) 0.75 % vaginal gel Place 1 Applicatorful vaginally 2 (two) times daily. 02/20/20   Shelly Bombard, MD  cetirizine Alethia Berthold  ALLERGY) 10 MG tablet Take 1 tablet (10 mg total) by mouth daily. 07/05/19 03/06/20  Darr, Edison Nasuti, PA-C    Allergies    Amoxicillin and Coconut flavor  Review of Systems   Review of Systems  Constitutional:  Positive for fever.  HENT:  Positive for congestion and sore throat. Negative for ear pain.   Respiratory:  Positive for cough and wheezing. Negative for shortness of breath.   Cardiovascular:  Negative for chest pain and leg swelling.  Gastrointestinal:  Negative for abdominal pain, diarrhea, nausea and vomiting.  All other systems reviewed and are negative.  Physical Exam Updated Vital Signs BP 113/63 (BP Location: Left Arm)   Pulse 77   Temp 98.1 F (36.7 C) (Oral)   Resp 14   SpO2 99%   Physical Exam Vitals and nursing note reviewed.  Constitutional:      General: [name]    Appearance: She is well-developed.  HENT:     Head: Normocephalic and atraumatic.     Right Ear: Ear canal normal. Tympanic membrane is not  perforated, erythematous, retracted or bulging.     Left Ear: Ear canal normal. Tympanic membrane is not perforated, erythematous, retracted or bulging.     Ears:     Comments: No mastoid erythema/swelling/tenderness.     Nose: Congestion present.     Right Sinus: Frontal sinus tenderness present. No maxillary sinus tenderness.     Left Sinus: Frontal sinus tenderness present. No maxillary sinus tenderness.     Mouth/Throat:     Pharynx: Uvula midline. No oropharyngeal exudate or posterior oropharyngeal erythema.     Comments: Posterior oropharynx is symmetric appearing. Patient tolerating own secretions without difficulty. No trismus. No drooling. No hot potato voice. No swelling beneath the tongue, submandibular compartment is soft.  Eyes:     General:        Right eye: No discharge.        Left eye: No discharge.     Conjunctiva/sclera: Conjunctivae normal.     Pupils: Pupils are equal, round, and reactive to light.  Cardiovascular:     Rate and Rhythm: Normal rate and regular rhythm.     Heart sounds: No murmur heard. Pulmonary:     Effort: Pulmonary effort is normal. No respiratory distress.     Breath sounds: Normal breath sounds. No wheezing, rhonchi or rales.  Abdominal:     General: There is no distension.     Palpations: Abdomen is soft.     Tenderness: There is no abdominal tenderness.  Musculoskeletal:     Cervical back: Normal range of motion and neck supple. No edema or rigidity.  Lymphadenopathy:     Cervical: No cervical adenopathy.  Skin:    General: Skin is warm and dry.     Findings: No rash.  Neurological:     Mental Status: She is alert.  Psychiatric:        Behavior: Behavior normal.    ED Results / Procedures / Treatments   Labs (all labs ordered are listed, but only abnormal results are displayed) Labs Reviewed  RESP PANEL BY RT-PCR (FLU A&B, COVID) [id number]    EKG None  Radiology DG Chest 2 View  Result Date: 03/17/2021 CLINICAL DATA:  Cough,  chest congestion EXAM: CHEST - 2 VIEW COMPARISON:  03/12/2020 FINDINGS: The heart size and mediastinal contours are within normal limits. Both lungs are clear. The visualized skeletal structures are unremarkable. IMPRESSION: No active cardiopulmonary disease. Electronically  Signed   By: Fidela Salisbury M.D.   On: 03/17/2021 02:04    Procedures Procedures   Medications Ordered in ED Medications - No data to display  ED Course  I have reviewed the triage vital signs and the nursing notes.  Pertinent labs & imaging results that were available during my care of the patient were reviewed by me and considered in my medical decision making (see chart for details).    MDM Rules/Calculators/A&P                           Patient presents to the ED with complaints of URI sxs x 3-4 days. Patient is nontoxic, vitals without significant abnormality in the ED.   Additional history obtained:  Additional history obtained from chart review & nursing note review.   Lab Tests:  I reviewed and interpreted labs, which included:  Covid/flu testing: Negative.   Imaging Studies ordered:  I ordered imaging studies which included CXR, I independently reviewed, formal radiology impression shows:  No active cardiopulmonary disease.   ED Course:  Covid/flu negative CXR without infiltrate to suggest pneumonia.  No wheezing to suggest acute asthma exacerbation.  No signs of AOM, AOE, or mastoiditis.  Oropharynx clear.  No meningismus.  Nasal congestion noted, frontal sinus tenderness- given fever reported with worsening sxs and patient w/ hx of HIV will cover for bacterial sinusitis with doxycycline, PCN allergic, PCP follow up, additional supportive measures ordered. I discussed results, treatment plan, need for follow-up, and return precautions with the patient. Provided opportunity for questions, patient confirmed understanding and is in agreement with plan.   Portions of this note were generated with  [employer]. Dictation errors may occur despite best attempts at proofreading.  Final Clinical Impression(s) / ED Diagnoses Final diagnoses:  Nasal congestion  Frontal sinusitis, unspecified chronicity    Rx / DC Orders ED Discharge Orders          Ordered    doxycycline (VIBRAMYCIN) 100 MG capsule  2 times daily        03/17/21 0225    cetirizine (ZYRTEC ALLERGY) 10 MG tablet  Daily PRN        03/17/21 0225           Maria Nicholson was evaluated in Emergency Department on 03/17/2021 for the symptoms described in the history of present illness. He/she was evaluated in the context of the global COVID-19 pandemic, which necessitated consideration that the patient might be at risk for infection with the SARS-CoV-2 virus that causes COVID-19. Institutional protocols and algorithms that pertain to the evaluation of patients at risk for COVID-19 are in a state of rapid change based on information released by regulatory bodies including the CDC and federal and state organizations. These policies and algorithms were followed during the patient's care in the ED.    Leafy Kindle 03/17/21 0227    Ripley Fraise, MD 03/17/21 [phone]

## 2021-03-17 NOTE — ED Notes (Signed)
Patient transported to X-ray 

## 2021-03-17 NOTE — Discharge Instructions (Signed)
You were seen in the ER today for cough and congestion.  Your chest xray did not show pneumonia. Your covid/flu test were negative. Please take zyrtec daily as needed for congestion. Please take antibiotic doxycycline to cover for bacterial sinus infection.   We have prescribed you new medication(s) today. Discuss the medications prescribed today with your pharmacist as they can have adverse effects and interactions with your other medicines including over the counter and prescribed medications. Seek medical evaluation if you start to experience new or abnormal symptoms after taking one of these medicines, seek care immediately if you start to experience difficulty breathing, feeling of your throat closing, facial swelling, or rash as these could be indications of a more serious allergic reaction   Please follow up with primary care within 3 days for reevaluation. Return to the ER for new or worsening symptoms including but not limited to new or worsening pain, inability to keep fluids down, neck stiffness, confusion, trouble breathing, passing out, chest pain or any other concerns.

## 2021-03-19 ENCOUNTER — Other Ambulatory Visit: Payer: Self-pay

## 2021-03-19 ENCOUNTER — Other Ambulatory Visit: Payer: Self-pay | Admitting: Family

## 2021-03-19 DIAGNOSIS — B20 Human immunodeficiency virus [HIV] disease: Secondary | ICD-10-CM

## 2021-03-23 ENCOUNTER — Other Ambulatory Visit: Payer: Self-pay

## 2021-03-24 ENCOUNTER — Other Ambulatory Visit: Payer: Self-pay

## 2021-03-29 ENCOUNTER — Other Ambulatory Visit: Payer: Self-pay

## 2021-03-29 ENCOUNTER — Encounter: Payer: Self-pay | Admitting: Family

## 2021-03-29 ENCOUNTER — Ambulatory Visit (INDEPENDENT_AMBULATORY_CARE_PROVIDER_SITE_OTHER): Payer: Self-pay | Admitting: Family

## 2021-03-29 VITALS — BP 103/70 | HR 80 | Temp 98.1°F | Wt 252.0 lb

## 2021-03-29 DIAGNOSIS — Z0000 Encounter for general adult medical examination without abnormal findings: Secondary | ICD-10-CM

## 2021-03-29 DIAGNOSIS — B20 Human immunodeficiency virus [HIV] disease: Secondary | ICD-10-CM

## 2021-03-29 DIAGNOSIS — Z113 Encounter for screening for infections with a predominantly sexual mode of transmission: Secondary | ICD-10-CM

## 2021-03-29 MED ORDER — ABACAVIR SULFATE-LAMIVUDINE 600-300 MG PO TABS: 1.0000 | ORAL_TABLET | Freq: Every day | ORAL | 5 refills | Status: DC

## 2021-03-29 MED ORDER — PREZCOBIX 800-150 MG PO TABS: 1.0000 | ORAL_TABLET | Freq: Every day | ORAL | 5 refills | Status: DC

## 2021-03-29 NOTE — Progress Notes (Signed)
Brief Narrative   Patient ID: Maria Nicholson, female    DOB: 1985/11/24, 35 y.o.   MRN: [id number]  Maria Nicholson is a 35 y/o AA female with HIV disease on 10/03/2011 with risk factor of heterosexual contact. Initial viral load of 253,686 and CD4 count of 170. Genotype with K103N (efavirenz / nevirapine). Entered care at [facility] Stage 3. Previous ART history with Truvada and now Epizicom and Prezcobix. No history of opportunistic infection.   Subjective:    Chief Complaint  Patient presents with   Follow-up    HPI:  Maria Nicholson is a 35 y.o. female with HIV disease last seen through telehealth visit on 12/01/2020 with well-controlled virus and good adherence and tolerance to her ART regimen Prezcobix and Epzicom.  Viral load at the time was undetectable with CD4 count 628.  Here today for routine follow-up.  Maria Nicholson continues to take her Epzicom and Prezcobix daily as prescribed with no adverse side effects.  Overall feeling well today with no new concerns/complaints.  Continue to work with a Physiological scientist and improve her fitness. Denies fevers, chills, night sweats, headaches, changes in vision, neck pain/stiffness, nausea, diarrhea, vomiting, lesions or rashes.  Maria Nicholson has no problems obtaining medication from the pharmacy. Financial assistance renewed today. No current recreational or illict drug use or tobacco use. Alcohol consumption is on occasion. Condoms offered. Routine dental care up to date per recommendations. Healthcare maintenance due include Pneumovax and influenza.    Allergies  Allergen Reactions   Amoxicillin Hives    Did it involve swelling of the face/tongue/throat, SOB, or low BP? No Did it involve sudden or severe rash/hives, skin peeling, or any reaction on the inside of your mouth or nose? Yes Did you need to seek medical attention at a hospital or doctor's office? Yes When did it last happen?      35 yrs old If all above answers are "NO", may proceed  with cephalosporin use.    Coconut Flavor Swelling      Outpatient Medications Prior to Visit  Medication Sig Dispense Refill   cetirizine (ZYRTEC ALLERGY) 10 MG tablet Take 1 tablet (10 mg total) by mouth daily as needed for allergies or rhinitis. 15 tablet 0   doxycycline (VIBRAMYCIN) 100 MG capsule Take 1 capsule (100 mg total) by mouth 2 (two) times daily. 14 capsule 0   ferrous sulfate 325 (65 FE) MG tablet Take 1 tablet (325 mg total) by mouth daily. 30 tablet 0   ibuprofen (ADVIL) 600 MG tablet Take 1 tablet (600 mg total) by mouth every 6 (six) hours as needed. 30 tablet 0   metroNIDAZOLE (METROGEL VAGINAL) 0.75 % vaginal gel Place 1 Applicatorful vaginally 2 (two) times daily. 70 g 5   abacavir-lamiVUDine (EPZICOM) 600-300 MG tablet Take 1 tablet by mouth daily. 30 tablet 5   darunavir-cobicistat (PREZCOBIX) 800-150 MG tablet Take 1 tablet by mouth daily with breakfast. Swallow whole. Do NOT crush, break or chew tablets. Take with food. 30 tablet 5   No facility-administered medications prior to visit.     Past Medical History:  Diagnosis Date   Asthma    Bronchitis    HIV (human immunodeficiency virus infection) ([place])    No pertinent past medical history      Past Surgical History:  Procedure Laterality Date   DILATION AND CURETTAGE, DIAGNOSTIC / THERAPEUTIC        Review of Systems  Constitutional:  Negative for appetite change, chills,  diaphoresis, fatigue, fever and unexpected weight change.  Eyes:        Negative for acute change in vision  Respiratory:  Negative for chest tightness, shortness of breath and wheezing.   Cardiovascular:  Negative for chest pain.  Gastrointestinal:  Negative for diarrhea, nausea and vomiting.  Genitourinary:  Negative for dysuria, pelvic pain and vaginal discharge.  Musculoskeletal:  Negative for neck pain and neck stiffness.  Skin:  Negative for rash.  Neurological:  Negative for seizures, syncope, weakness and headaches.   Hematological:  Negative for adenopathy. Does not bruise/bleed easily.  Psychiatric/Behavioral:  Negative for hallucinations.      Objective:    BP 103/70   Pulse 80   Temp 98.1 F (36.7 C) (Oral)   Wt 252 lb (114.3 kg)   LMP 03/10/2021 (Approximate)   BMI 39.47 kg/m  Nursing note and vital signs reviewed.  Physical Exam Constitutional:      General: [name]    Appearance: She is well-developed.  Eyes:     Conjunctiva/sclera: Conjunctivae normal.  Cardiovascular:     Rate and Rhythm: Normal rate and regular rhythm.     Heart sounds: Normal heart sounds. No murmur heard.   No friction rub. No gallop.  Pulmonary:     Effort: Pulmonary effort is normal. No respiratory distress.     Breath sounds: Normal breath sounds. No wheezing or rales.  Chest:     Chest wall: No tenderness.  Abdominal:     General: Bowel sounds are normal.     Palpations: Abdomen is soft.     Tenderness: There is no abdominal tenderness.  Musculoskeletal:     Cervical back: Neck supple.  Lymphadenopathy:     Cervical: No cervical adenopathy.  Skin:    General: Skin is warm and dry.     Findings: No rash.  Neurological:     Mental Status: She is alert and oriented to person, place, and time.  Psychiatric:        Behavior: Behavior normal.        Thought Content: Thought content normal.        Judgment: Judgment normal.     Depression screen [facility] 2/9 12/01/2020 11/12/2020 04/21/2020 04/15/2019 01/29/2019  Decreased Interest 0 0 0 0 0  Down, Depressed, Hopeless 0 0 0 0 0  PHQ - 2 Score 0 0 0 0 0       Assessment & Plan:    Patient Active Problem List   Diagnosis Date Noted   Tongue lesion 09/10/2019   Vaginal itching 01/29/2019   Healthcare maintenance 11/27/2018   Fibroids 11/27/2018   Human immunodeficiency virus (HIV) disease ([place]) 11/06/2017   Eczema 11/17/2011     Problem List Items Addressed This Visit       Other   Human immunodeficiency virus (HIV)  disease ([place])   Relevant Medications   abacavir-lamiVUDine (EPZICOM) 600-300 MG tablet   darunavir-cobicistat (PREZCOBIX) 800-150 MG tablet   Other Relevant Orders   HIV-1 RNA quant-no reflex-bld   T-helper cell (CD4)- ([facility] clinic only)   COMPLETE METABOLIC PANEL WITH GFR   Healthcare maintenance   Other Visit Diagnoses     Screening for STDs (sexually transmitted diseases)    -  Primary   Relevant Orders   RPR   Cytology (oral, anal, urethral) ancillary only   Urine cytology ancillary only([place])        I am having [name] maintain her ferrous sulfate,  metroNIDAZOLE, ibuprofen, doxycycline, cetirizine, abacavir-lamiVUDine, and Prezcobix.   Meds ordered this encounter  Medications   abacavir-lamiVUDine (EPZICOM) 600-300 MG tablet    Sig: Take 1 tablet by mouth daily.    Dispense:  30 tablet    Refill:  5    Order Specific Question:   Supervising Provider    Answer:   Baxter Flattery, CYNTHIA [[id number]]   darunavir-cobicistat (PREZCOBIX) 800-150 MG tablet    Sig: Take 1 tablet by mouth daily with breakfast. Swallow whole. Do NOT crush, break or chew tablets. Take with food.    Dispense:  30 tablet    Refill:  5    Order Specific Question:   Supervising Provider    Answer:   Carlyle Basques [[id number]]     Follow-up: Return in about 4 months (around 07/30/2021), or if symptoms worsen or fail to improve.   Terri Piedra, MSN, FNP-C Nurse Practitioner [facility] for Infectious Disease [place] number: [phone]

## 2021-03-29 NOTE — Assessment & Plan Note (Signed)
Maria Nicholson continues to have well-controlled virus with good adherence and tolerance to her ART regimen of Prezcobix and Epzicom.  No signs/symptoms of opportunistic infection.  We reviewed lab work and discussed plan of care.  Check blood work today.  Continue current dose of Epzicom and Prezcobix.  Financial assistance has been renewed.  Plan for follow-up in 4 months or sooner if needed with lab work on the same day.

## 2021-03-29 NOTE — Assessment & Plan Note (Signed)
   Discussed importance of safe sexual practices and condom usage.  Condoms offered.  Routine dental care up-to-date per recommendations.  Declines vaccines.

## 2021-03-29 NOTE — Patient Instructions (Addendum)
Nice to see you.  We will check your lab work today.  Continue to take your medication daily as prescribed.  Refills have been sent to the pharmacy.  Plan for follow up in 4 months or sooner if needed with lab work on the same day.  Have a great day and stay safe!  

## 2021-03-30 LAB — CYTOLOGY, (ORAL, ANAL, URETHRAL) ANCILLARY ONLY
Chlamydia: NEGATIVE
Comment: NEGATIVE
Comment: NORMAL
Neisseria Gonorrhea: NEGATIVE

## 2021-03-30 LAB — T-HELPER CELL (CD4) - (RCID CLINIC ONLY)
CD4 % Helper T Cell: 36 % (ref 33–65)
CD4 T Cell Abs: 815 /uL (ref 400–1790)

## 2021-03-30 LAB — URINE CYTOLOGY ANCILLARY ONLY
Chlamydia: NEGATIVE
Comment: NEGATIVE
Comment: NORMAL
Neisseria Gonorrhea: NEGATIVE

## 2021-04-01 LAB — COMPLETE METABOLIC PANEL WITH GFR
AG Ratio: 1.4 (calc) (ref 1.0–2.5)
ALT: 13 U/L (ref 6–29)
AST: 13 U/L (ref 10–30)
Albumin: 4.3 g/dL (ref 3.6–5.1)
Alkaline phosphatase (APISO): 58 U/L (ref 31–125)
BUN: 18 mg/dL (ref 7–25)
CO2: 27 mmol/L (ref 20–32)
Calcium: 9.9 mg/dL (ref 8.6–10.2)
Chloride: 103 mmol/L (ref 98–110)
Creat: 0.91 mg/dL (ref 0.50–0.97)
Globulin: 3 g/dL (calc) (ref 1.9–3.7)
Glucose, Bld: 87 mg/dL (ref 65–99)
Potassium: 4.4 mmol/L (ref 3.5–5.3)
Sodium: 138 mmol/L (ref 135–146)
Total Bilirubin: 0.2 mg/dL (ref 0.2–1.2)
Total Protein: 7.3 g/dL (ref 6.1–8.1)
eGFR: 85 mL/min/{1.73_m2} (ref >=60)

## 2021-04-01 LAB — HIV-1 RNA QUANT-NO REFLEX-BLD
HIV 1 RNA Quant: 74 Copies/mL — ABNORMAL HIGH
HIV-1 RNA Quant, Log: 1.87 Log cps/mL — ABNORMAL HIGH

## 2021-04-01 LAB — RPR: RPR Ser Ql: NONREACTIVE

## 2021-05-05 ENCOUNTER — Encounter: Payer: Self-pay | Admitting: Family

## 2021-05-12 ENCOUNTER — Other Ambulatory Visit: Payer: Self-pay

## 2021-05-12 DIAGNOSIS — B20 Human immunodeficiency virus [HIV] disease: Secondary | ICD-10-CM

## 2021-05-14 LAB — CBC WITH DIFFERENTIAL/PLATELET
Absolute Monocytes: 943 cells/uL (ref 200–950)
Basophils Absolute: 91 cells/uL (ref 0–200)
Basophils Relative: 1.4 %
Eosinophils Absolute: 137 cells/uL (ref 15–500)
Eosinophils Relative: 2.1 %
HCT: 33.8 % — ABNORMAL LOW (ref 35.0–45.0)
Hemoglobin: 10.6 g/dL — ABNORMAL LOW (ref 11.7–15.5)
Lymphs Abs: 1521 cells/uL (ref 850–3900)
MCH: 26.8 pg — ABNORMAL LOW (ref 27.0–33.0)
MCHC: 31.4 g/dL — ABNORMAL LOW (ref 32.0–36.0)
MCV: 85.4 fL (ref 80.0–100.0)
MPV: 10.4 fL (ref 7.5–12.5)
Monocytes Relative: 14.5 %
Neutro Abs: 3809 cells/uL (ref 1500–7800)
Neutrophils Relative %: 58.6 %
Platelets: 477 10*3/uL — ABNORMAL HIGH (ref 140–400)
RBC: 3.96 10*6/uL (ref 3.80–5.10)
RDW: 15.4 % — ABNORMAL HIGH (ref 11.0–15.0)
Total Lymphocyte: 23.4 %
WBC: 6.5 10*3/uL (ref 3.8–10.8)

## 2021-05-14 LAB — T-HELPER CELLS (CD4) COUNT (NOT AT ARMC)
Absolute CD4: 644 cells/uL (ref 490–1740)
CD4 T Helper %: 37 % (ref 30–61)
Total lymphocyte count: 1749 cells/uL (ref 850–3900)

## 2021-07-19 ENCOUNTER — Telehealth: Payer: Self-pay

## 2021-07-19 NOTE — Telephone Encounter (Signed)
Called patient to get appointment scheduled for labs per appointment request, left patient a voicemail to call us back to get scheduled.

## 2021-07-23 ENCOUNTER — Other Ambulatory Visit: Payer: Self-pay

## 2021-07-23 DIAGNOSIS — Z113 Encounter for screening for infections with a predominantly sexual mode of transmission: Secondary | ICD-10-CM

## 2021-07-23 DIAGNOSIS — Z79899 Other long term (current) drug therapy: Secondary | ICD-10-CM

## 2021-07-23 DIAGNOSIS — B20 Human immunodeficiency virus [HIV] disease: Secondary | ICD-10-CM

## 2021-07-26 ENCOUNTER — Other Ambulatory Visit: Payer: Self-pay

## 2021-07-26 DIAGNOSIS — Z113 Encounter for screening for infections with a predominantly sexual mode of transmission: Secondary | ICD-10-CM

## 2021-07-26 DIAGNOSIS — B20 Human immunodeficiency virus [HIV] disease: Secondary | ICD-10-CM

## 2021-07-26 DIAGNOSIS — Z79899 Other long term (current) drug therapy: Secondary | ICD-10-CM

## 2021-07-28 LAB — COMPLETE METABOLIC PANEL WITH GFR
AG Ratio: 1.3 (calc) (ref 1.0–2.5)
ALT: 12 U/L (ref 6–29)
AST: 13 U/L (ref 10–30)
Albumin: 4.2 g/dL (ref 3.6–5.1)
Alkaline phosphatase (APISO): 58 U/L (ref 31–125)
BUN: 15 mg/dL (ref 7–25)
CO2: 25 mmol/L (ref 20–32)
Calcium: 9.2 mg/dL (ref 8.6–10.2)
Chloride: 104 mmol/L (ref 98–110)
Creat: 0.8 mg/dL (ref 0.50–0.97)
Globulin: 3.2 g/dL (calc) (ref 1.9–3.7)
Glucose, Bld: 95 mg/dL (ref 65–99)
Potassium: 4.2 mmol/L (ref 3.5–5.3)
Sodium: 135 mmol/L (ref 135–146)
Total Bilirubin: 0.2 mg/dL (ref 0.2–1.2)
Total Protein: 7.4 g/dL (ref 6.1–8.1)
eGFR: 100 mL/min/{1.73_m2} (ref >=60)

## 2021-07-28 LAB — CBC WITH DIFFERENTIAL/PLATELET
Absolute Monocytes: 857 cells/uL (ref 200–950)
Basophils Absolute: 82 cells/uL (ref 0–200)
Basophils Relative: 1.2 %
Eosinophils Absolute: 292 cells/uL (ref 15–500)
Eosinophils Relative: 4.3 %
HCT: 34.7 % — ABNORMAL LOW (ref 35.0–45.0)
Hemoglobin: 11.1 g/dL — ABNORMAL LOW (ref 11.7–15.5)
Lymphs Abs: 1775 cells/uL (ref 850–3900)
MCH: 28.3 pg (ref 27.0–33.0)
MCHC: 32 g/dL (ref 32.0–36.0)
MCV: 88.5 fL (ref 80.0–100.0)
MPV: 10.3 fL (ref 7.5–12.5)
Monocytes Relative: 12.6 %
Neutro Abs: 3794 cells/uL (ref 1500–7800)
Neutrophils Relative %: 55.8 %
Platelets: 406 10*3/uL — ABNORMAL HIGH (ref 140–400)
RBC: 3.92 10*6/uL (ref 3.80–5.10)
RDW: 14.2 % (ref 11.0–15.0)
Total Lymphocyte: 26.1 %
WBC: 6.8 10*3/uL (ref 3.8–10.8)

## 2021-07-28 LAB — RPR: RPR Ser Ql: NONREACTIVE

## 2021-07-28 LAB — C. TRACHOMATIS/N. GONORRHOEAE RNA
C. trachomatis RNA, TMA: NOT DETECTED
N. gonorrhoeae RNA, TMA: NOT DETECTED

## 2021-07-28 LAB — T-HELPER CELLS (CD4) COUNT (NOT AT ARMC)
Absolute CD4: 679 cells/uL (ref 490–1740)
CD4 T Helper %: 34 % (ref 30–61)
Total lymphocyte count: 1981 cells/uL (ref 850–3900)

## 2021-07-28 LAB — HIV-1 RNA QUANT-NO REFLEX-BLD
HIV 1 RNA Quant: NOT DETECTED Copies/mL
HIV-1 RNA Quant, Log: NOT DETECTED Log cps/mL

## 2021-07-28 LAB — LIPID PANEL
Cholesterol: 154 mg/dL (ref <200)
HDL: 51 mg/dL (ref >=50)
LDL Cholesterol (Calc): 79 mg/dL (calc)
Non-HDL Cholesterol (Calc): 103 mg/dL (calc) (ref <130)
Total CHOL/HDL Ratio: 3 (calc) (ref <5.0)
Triglycerides: 138 mg/dL (ref <150)

## 2021-08-09 ENCOUNTER — Encounter: Payer: Self-pay | Admitting: Family

## 2021-08-09 ENCOUNTER — Other Ambulatory Visit: Payer: Self-pay

## 2021-08-09 ENCOUNTER — Ambulatory Visit (INDEPENDENT_AMBULATORY_CARE_PROVIDER_SITE_OTHER): Payer: Self-pay | Admitting: Family

## 2021-08-09 VITALS — BP 118/79 | HR 81 | Temp 98.2°F | Wt 261.0 lb

## 2021-08-09 DIAGNOSIS — B20 Human immunodeficiency virus [HIV] disease: Secondary | ICD-10-CM

## 2021-08-09 DIAGNOSIS — Z0000 Encounter for general adult medical examination without abnormal findings: Secondary | ICD-10-CM

## 2021-08-09 MED ORDER — ABACAVIR SULFATE-LAMIVUDINE 600-300 MG PO TABS: 1.0000 | ORAL_TABLET | Freq: Every day | ORAL | 5 refills | Status: DC

## 2021-08-09 MED ORDER — PREZCOBIX 800-150 MG PO TABS: 1.0000 | ORAL_TABLET | Freq: Every day | ORAL | 5 refills | Status: DC

## 2021-08-09 NOTE — Progress Notes (Signed)
Brief Narrative   Patient ID: Maria Nicholson, female    DOB: 1985-09-28, 36 y.o.   MRN: [id number]  Maria Nicholson is a 36 y/o AA female with HIV disease on 10/03/2011 with risk factor of heterosexual contact. Initial viral load of 253,686 and CD4 count of 170. Genotype with K103N (efavirenz / nevirapine). Entered care at [facility] Stage 3. Previous ART history with Truvada and now Epizicom and Prezcobix. No history of opportunistic infection.   Subjective:    Chief Complaint  Patient presents with   Follow-up    HPI:  Maria Nicholson is a 36 y.o. female with HIV disease last seen on 03/29/2021 with well-controlled virus and good adherence and tolerance to her ART regimen of Prezcobix and Epzicom.  Viral load was 74 with CD4 count of 644.  Most recent blood work completed on 07/26/2021 with viral load that remains undetectable and CD4 count of 679.  Kidney function, liver function, electrolytes within normal ranges.  Here today for routine follow-up.  Maria Nicholson continues to take her Prezcobix and Epzicom daily as prescribed with no adverse side effects.  Overall feeling well today with no new concerns/complaints. Denies fevers, chills, night sweats, headaches, changes in vision, neck pain/stiffness, nausea, diarrhea, vomiting, lesions or rashes.  Maria Nicholson has no problems obtaining medication from the pharmacy and remains covered by [employer].  Denies feelings of being down, depressed, or hopeless recently.  No current recreational or illicit drug use or tobacco use and occasional alcohol consumption.  Condoms offered.  We will complete healthcare maintenance with primary care.   Allergies  Allergen Reactions   Amoxicillin Hives    Did it involve swelling of the face/tongue/throat, SOB, or low BP? No Did it involve sudden or severe rash/hives, skin peeling, or any reaction on the inside of your mouth or nose? Yes Did you need to seek medical attention at a hospital or doctor's office? Yes When did  it last happen?      36 yrs old If all above answers are NO, may proceed with cephalosporin use.    Coconut Flavor Swelling      Outpatient Medications Prior to Visit  Medication Sig Dispense Refill   cetirizine (ZYRTEC ALLERGY) 10 MG tablet Take 1 tablet (10 mg total) by mouth daily as needed for allergies or rhinitis. 15 tablet 0   ferrous sulfate 325 (65 FE) MG tablet Take 1 tablet (325 mg total) by mouth daily. 30 tablet 0   ibuprofen (ADVIL) 600 MG tablet Take 1 tablet (600 mg total) by mouth every 6 (six) hours as needed. 30 tablet 0   abacavir-lamiVUDine (EPZICOM) 600-300 MG tablet Take 1 tablet by mouth daily. 30 tablet 5   darunavir-cobicistat (PREZCOBIX) 800-150 MG tablet Take 1 tablet by mouth daily with breakfast. Swallow whole. Do NOT crush, break or chew tablets. Take with food. 30 tablet 5   doxycycline (VIBRAMYCIN) 100 MG capsule Take 1 capsule (100 mg total) by mouth 2 (two) times daily. (Patient not taking: Reported on 08/09/2021) 14 capsule 0   metroNIDAZOLE (METROGEL VAGINAL) 0.75 % vaginal gel Place 1 Applicatorful vaginally 2 (two) times daily. (Patient not taking: Reported on 08/09/2021) 70 g 5   No facility-administered medications prior to visit.     Past Medical History:  Diagnosis Date   Asthma    Bronchitis    HIV (human immunodeficiency virus infection) ([place])    No pertinent past medical history      Past Surgical History:  Procedure Laterality Date   DILATION AND CURETTAGE, DIAGNOSTIC / THERAPEUTIC        Review of Systems  Constitutional:  Negative for appetite change, chills, diaphoresis, fatigue, fever and unexpected weight change.  Eyes:        Negative for acute change in vision  Respiratory:  Negative for chest tightness, shortness of breath and wheezing.   Cardiovascular:  Negative for chest pain.  Gastrointestinal:  Negative for diarrhea, nausea and vomiting.  Genitourinary:  Negative for dysuria, pelvic pain and vaginal discharge.   Musculoskeletal:  Negative for neck pain and neck stiffness.  Skin:  Negative for rash.  Neurological:  Negative for seizures, syncope, weakness and headaches.  Hematological:  Negative for adenopathy. Does not bruise/bleed easily.  Psychiatric/Behavioral:  Negative for hallucinations.      Objective:    BP 118/79    Pulse 81    Temp 98.2 F (36.8 C) (Oral)    Wt 261 lb (118.4 kg)    BMI 40.88 kg/m  Nursing note and vital signs reviewed.  Physical Exam Constitutional:      General: [name]    Appearance: She is well-developed.  Eyes:     Conjunctiva/sclera: Conjunctivae normal.  Cardiovascular:     Rate and Rhythm: Normal rate and regular rhythm.     Heart sounds: Normal heart sounds. No murmur heard.   No friction rub. No gallop.  Pulmonary:     Effort: Pulmonary effort is normal. No respiratory distress.     Breath sounds: Normal breath sounds. No wheezing or rales.  Chest:     Chest wall: No tenderness.  Abdominal:     General: Bowel sounds are normal.     Palpations: Abdomen is soft.     Tenderness: There is no abdominal tenderness.  Musculoskeletal:     Cervical back: Neck supple.  Lymphadenopathy:     Cervical: No cervical adenopathy.  Skin:    General: Skin is warm and dry.     Findings: No rash.  Neurological:     Mental Status: She is alert and oriented to person, place, and time.  Psychiatric:        Behavior: Behavior normal.        Thought Content: Thought content normal.        Judgment: Judgment normal.     Depression screen [facility] 2/9 08/09/2021 12/01/2020 11/12/2020 04/21/2020 04/15/2019  Decreased Interest 0 0 0 0 0  Down, Depressed, Hopeless 0 0 0 0 0  PHQ - 2 Score 0 0 0 0 0       Assessment & Plan:    Patient Active Problem List   Diagnosis Date Noted   Tongue lesion 09/10/2019   Vaginal itching 01/29/2019   Healthcare maintenance 11/27/2018   Fibroids 11/27/2018   Human immunodeficiency virus (HIV) disease ([place])  11/06/2017   Eczema 11/17/2011     Problem List Items Addressed This Visit       Other   Human immunodeficiency virus (HIV) disease ([place])    Maria Nicholson continues to have well-controlled virus with good adherence and tolerance to her ART regimen of Prezcobix and Epzicom.  No signs/symptoms of opportunistic infection.  We reviewed lab work and discussed plan of care.  Continue current dose of Epzicom and Prezcobix.  Renew financial assistance. Plan for follow-up in 6 months or sooner if needed with lab work 1 to 2 weeks prior to appointment.       Relevant Medications  darunavir-cobicistat (PREZCOBIX) 800-150 MG tablet   abacavir-lamiVUDine (EPZICOM) 600-300 MG tablet   Other Relevant Orders   T-helper cells (CD4) count (not at [facility])   HIV-1 RNA quant-no reflex-bld   Comprehensive metabolic panel   Healthcare maintenance    Discussed importance of safe sexual practices and condom use.  Condoms offered. Declines vaccinations she will proceed with her primary care provider. Encourage routine dental care which she will complete independently and can refer to [facility] if needed.         I have discontinued Caryl Pina T. Estock's metroNIDAZOLE and doxycycline. I am also having her maintain her ferrous sulfate, ibuprofen, cetirizine, Prezcobix, and abacavir-lamiVUDine.   Meds ordered this encounter  Medications   darunavir-cobicistat (PREZCOBIX) 800-150 MG tablet    Sig: Take 1 tablet by mouth daily with breakfast. Swallow whole. Do NOT crush, break or chew tablets. Take with food.    Dispense:  30 tablet    Refill:  5    Order Specific Question:   Supervising Provider    Answer:   Baxter Flattery, CYNTHIA [[id number]]   abacavir-lamiVUDine (EPZICOM) 600-300 MG tablet    Sig: Take 1 tablet by mouth daily.    Dispense:  30 tablet    Refill:  5    Order Specific Question:   Supervising Provider    Answer:   Carlyle Basques [[id number]]     Follow-up: Return in about 4 months (around 12/07/2021), or if  symptoms worsen or fail to improve.   Terri Piedra, MSN, FNP-C Nurse Practitioner [facility] for Infectious Disease [place] number: [phone]

## 2021-08-09 NOTE — Assessment & Plan Note (Signed)
·   Discussed importance of safe sexual practices and condom use.  Condoms offered.  Declines vaccinations she will proceed with her primary care provider.  Encourage routine dental care which she will complete independently and can refer to [facility] if needed.

## 2021-08-09 NOTE — Assessment & Plan Note (Signed)
Maria Nicholson continues to have well-controlled virus with good adherence and tolerance to her ART regimen of Prezcobix and Epzicom.  No signs/symptoms of opportunistic infection.  We reviewed lab work and discussed plan of care.  Continue current dose of Epzicom and Prezcobix.  Renew financial assistance. Plan for follow-up in 6 months or sooner if needed with lab work 1 to 2 weeks prior to appointment.

## 2021-08-09 NOTE — Patient Instructions (Addendum)
Nice to see you.  Continue to take your medication daily as prescribed.  Refills have been sent to the pharmacy.  Plan for follow up in 4 months or sooner if needed with lab work 1-2 weeks prior to appointment.   Have a great day and stay safe!  

## 2021-10-27 ENCOUNTER — Telehealth: Payer: Self-pay

## 2021-10-27 NOTE — Telephone Encounter (Signed)
Patient called needing help finding a gynecologist as she is uninsured. Provided her with phone number for [facility].  ? ?Beryle Flock, RN ? ?

## 2022-03-30 ENCOUNTER — Encounter: Payer: Self-pay | Admitting: Family

## 2022-03-30 ENCOUNTER — Ambulatory Visit (INDEPENDENT_AMBULATORY_CARE_PROVIDER_SITE_OTHER): Payer: Self-pay | Admitting: Family

## 2022-03-30 ENCOUNTER — Other Ambulatory Visit: Payer: Self-pay

## 2022-03-30 VITALS — BP 111/73 | HR 79 | Temp 98.0°F | Ht 67.0 in | Wt 278.0 lb

## 2022-03-30 DIAGNOSIS — Z0000 Encounter for general adult medical examination without abnormal findings: Secondary | ICD-10-CM

## 2022-03-30 DIAGNOSIS — Z113 Encounter for screening for infections with a predominantly sexual mode of transmission: Secondary | ICD-10-CM

## 2022-03-30 DIAGNOSIS — B20 Human immunodeficiency virus [HIV] disease: Secondary | ICD-10-CM

## 2022-03-30 DIAGNOSIS — Z79899 Other long term (current) drug therapy: Secondary | ICD-10-CM

## 2022-03-30 MED ORDER — PREZCOBIX 800-150 MG PO TABS: 1.0000 | ORAL_TABLET | Freq: Every day | ORAL | 5 refills | Status: DC

## 2022-03-30 MED ORDER — ABACAVIR SULFATE-LAMIVUDINE 600-300 MG PO TABS: 1.0000 | ORAL_TABLET | Freq: Every day | ORAL | 5 refills | Status: DC

## 2022-03-30 NOTE — Patient Instructions (Addendum)
Nice to see you.   We will check your lab work today.  Continue to take your medications daily as prescribed.   Refills have been sent to the pharmacy.  Renew financial assistance today and after January 1st.   [facility] [address] [place],  [address] Phone: [phone]  Plan for follow up in 6 months or sooner if needed with lab work on the same day.  Have a great day!

## 2022-03-30 NOTE — Assessment & Plan Note (Signed)
Maria Nicholson continues to have well-controlled virus with good adherence and tolerance to Epzicom and Prezcobix.  Reviewed previous lab work and discussed plan of care.  Continue current dose of Prezcobix and Epzicom.  Renew financial assistance.  Check lab work today.  Plan for follow-up in 6 months or sooner if needed with lab work on the same day.

## 2022-03-30 NOTE — Progress Notes (Signed)
Brief Narrative   Patient ID: Maria Nicholson, female    DOB: [birth year], 36 y.o.   MRN: [id number]  Maria Nicholson is a 36 y/o AA female with HIV disease on 10/03/2011 with risk factor of heterosexual contact. Initial viral load of 253,686 and CD4 count of 170. Genotype with K103N (efavirenz / nevirapine). Entered care at [facility] Stage 3. Previous ART history with Truvada and now Epizicom and Prezcobix. No history of opportunistic infection.   Subjective:    Chief Complaint  Patient presents with   HIV Positive/AIDS    HPI:  Maria Nicholson is a 36 y.o. female with HIV disease last seen on 08/09/2021 with well-controlled virus and good adherence and tolerance to Prezcobix and Epzicom.  Viral load was undetectable with CD4 count of 679.  STD testing negative.  Kidney function, liver function, electrolytes within normal ranges.  Here today for follow-up.  Maria Nicholson has been doing well since her last office visit and has had a wonderful summer with her family.  Continues to take her medication as prescribed with no adverse side effects or problems obtaining medication from the pharmacy.  Overall feeling well today with no new concerns/complaints.  Is having difficulties finding a gynecologist for routine women's care.  Declines vaccinations.  Condoms and STD testing offered.  Denies fevers, chills, night sweats, headaches, changes in vision, neck pain/stiffness, nausea, diarrhea, vomiting, lesions or rashes.   Allergies  Allergen Reactions   Amoxicillin Hives    Did it involve swelling of the face/tongue/throat, SOB, or low BP? No Did it involve sudden or severe rash/hives, skin peeling, or any reaction on the inside of your mouth or nose? Yes Did you need to seek medical attention at a hospital or doctor's office? Yes When did it last happen?      36 yrs old If all above answers are "NO", may proceed with cephalosporin use.    Coconut Flavor Swelling      Outpatient Medications Prior to  Visit  Medication Sig Dispense Refill   cetirizine (ZYRTEC ALLERGY) 10 MG tablet Take 1 tablet (10 mg total) by mouth daily as needed for allergies or rhinitis. 15 tablet 0   ferrous sulfate 325 (65 FE) MG tablet Take 1 tablet (325 mg total) by mouth daily. 30 tablet 0   ibuprofen (ADVIL) 600 MG tablet Take 1 tablet (600 mg total) by mouth every 6 (six) hours as needed. 30 tablet 0   abacavir-lamiVUDine (EPZICOM) 600-300 MG tablet Take 1 tablet by mouth daily. 30 tablet 5   darunavir-cobicistat (PREZCOBIX) 800-150 MG tablet Take 1 tablet by mouth daily with breakfast. Swallow whole. Do NOT crush, break or chew tablets. Take with food. 30 tablet 5   No facility-administered medications prior to visit.     Past Medical History:  Diagnosis Date   Asthma    Bronchitis    HIV (human immunodeficiency virus infection) ([place])    No pertinent past medical history      Past Surgical History:  Procedure Laterality Date   DILATION AND CURETTAGE, DIAGNOSTIC / THERAPEUTIC        Review of Systems  Constitutional:  Negative for appetite change, chills, diaphoresis, fatigue, fever and unexpected weight change.  Eyes:        Negative for acute change in vision  Respiratory:  Negative for chest tightness, shortness of breath and wheezing.   Cardiovascular:  Negative for chest pain.  Gastrointestinal:  Negative for diarrhea, nausea and vomiting.  Genitourinary:  Negative for dysuria, pelvic pain and vaginal discharge.  Musculoskeletal:  Negative for neck pain and neck stiffness.  Skin:  Negative for rash.  Neurological:  Negative for seizures, syncope, weakness and headaches.  Hematological:  Negative for adenopathy. Does not bruise/bleed easily.  Psychiatric/Behavioral:  Negative for hallucinations.       Objective:    BP 111/73   Pulse 79   Temp 98 F (36.7 C) (Oral)   Ht '5\' 7"'$  (1.702 m)   Wt 278 lb (126.1 kg)   LMP 03/30/2022 (Approximate)   BMI 43.54 kg/m  Nursing note and vital  signs reviewed.  Physical Exam Constitutional:      General: [name]    Appearance: She is well-developed.  Eyes:     Conjunctiva/sclera: Conjunctivae normal.  Cardiovascular:     Rate and Rhythm: Normal rate and regular rhythm.     Heart sounds: Normal heart sounds. No murmur heard.    No friction rub. No gallop.  Pulmonary:     Effort: Pulmonary effort is normal. No respiratory distress.     Breath sounds: Normal breath sounds. No wheezing or rales.  Chest:     Chest wall: No tenderness.  Abdominal:     General: Bowel sounds are normal.     Palpations: Abdomen is soft.     Tenderness: There is no abdominal tenderness.  Musculoskeletal:     Cervical back: Neck supple.  Lymphadenopathy:     Cervical: No cervical adenopathy.  Skin:    General: Skin is warm and dry.     Findings: No rash.  Neurological:     Mental Status: She is alert and oriented to person, place, and time.  Psychiatric:        Behavior: Behavior normal.        Thought Content: Thought content normal.        Judgment: Judgment normal.         08/09/2021    2:44 PM 12/01/2020   11:44 AM 11/12/2020    3:08 PM 04/21/2020    4:41 PM 04/15/2019    2:05 PM  Depression screen PHQ 2/9  Decreased Interest 0 0 0 0 0  Down, Depressed, Hopeless 0 0 0 0 0  PHQ - 2 Score 0 0 0 0 0       Assessment & Plan:    Patient Active Problem List   Diagnosis Date Noted   Tongue lesion 09/10/2019   Vaginal itching 01/29/2019   Healthcare maintenance 11/27/2018   Fibroids 11/27/2018   Human immunodeficiency virus (HIV) disease ([place]) 11/06/2017   Eczema 11/17/2011     Problem List Items Addressed This Visit       Other   Human immunodeficiency virus (HIV) disease ([place]) - Primary    Maria Nicholson continues to have well-controlled virus with good adherence and tolerance to Epzicom and Prezcobix.  Reviewed previous lab work and discussed plan of care.  Continue current dose of Prezcobix and Epzicom.   Renew financial assistance.  Check lab work today.  Plan for follow-up in 6 months or sooner if needed with lab work on the same day.      Relevant Medications   abacavir-lamiVUDine (EPZICOM) 600-300 MG tablet   darunavir-cobicistat (PREZCOBIX) 800-150 MG tablet   Other Relevant Orders   Comprehensive metabolic panel   HIV-1 RNA quant-no reflex-bld   T-helper cell (CD4)- ([facility] clinic only)   Healthcare maintenance    Discussed importance of safe sexual practice, family-planning,  and condom use.  Condoms and STD testing offered. Declines vaccinations. Routine dental care up-to-date per recommendations. Information for [facility] provided in after visit summary to make appointment for routine women's care.      Other Visit Diagnoses     Pharmacologic therapy       Relevant Orders   Lipid panel   Screening for STDs (sexually transmitted diseases)       Relevant Orders   RPR   Urine cytology ancillary only   Cytology (oral, anal, urethral) ancillary only        I am having Maria Nicholson maintain her ferrous sulfate, ibuprofen, cetirizine, abacavir-lamiVUDine, and Prezcobix.   Meds ordered this encounter  Medications   abacavir-lamiVUDine (EPZICOM) 600-300 MG tablet    Sig: Take 1 tablet by mouth daily.    Dispense:  30 tablet    Refill:  5    Order Specific Question:   Supervising Provider    Answer:   Baxter Flattery, CYNTHIA [[id number]]   darunavir-cobicistat (PREZCOBIX) 800-150 MG tablet    Sig: Take 1 tablet by mouth daily with breakfast. Swallow whole. Do NOT crush, break or chew tablets. Take with food.    Dispense:  30 tablet    Refill:  5    Order Specific Question:   Supervising Provider    Answer:   Carlyle Basques [[id number]]     Follow-up: Return in about 6 months (around 09/29/2022), or if symptoms worsen or fail to improve.   Terri Piedra, MSN, FNP-C Nurse Practitioner [facility] [place] number:  [phone]

## 2022-03-30 NOTE — Addendum Note (Signed)
Addended by: Caffie Pinto on: 03/30/2022 02:18 PM   Modules accepted: Orders

## 2022-03-30 NOTE — Assessment & Plan Note (Addendum)
   Discussed importance of safe sexual practice, family-planning, and condom use.  Condoms and STD testing offered.  Declines vaccinations.  Routine dental care up-to-date per recommendations.  Information for [facility] provided in after visit summary to make appointment for routine women's care.

## 2022-03-31 LAB — T-HELPER CELL (CD4) - (RCID CLINIC ONLY)
CD4 % Helper T Cell: 35 % (ref 33–65)
CD4 T Cell Abs: 651 /uL (ref 400–1790)

## 2022-03-31 LAB — CYTOLOGY, (ORAL, ANAL, URETHRAL) ANCILLARY ONLY
Chlamydia: NEGATIVE
Comment: NEGATIVE
Comment: NORMAL
Neisseria Gonorrhea: NEGATIVE

## 2022-03-31 LAB — URINE CYTOLOGY ANCILLARY ONLY
Chlamydia: NEGATIVE
Comment: NEGATIVE
Comment: NORMAL
Neisseria Gonorrhea: NEGATIVE

## 2022-04-02 LAB — COMPREHENSIVE METABOLIC PANEL
AG Ratio: 1.2 (calc) (ref 1.0–2.5)
ALT: 9 U/L (ref 6–29)
AST: 14 U/L (ref 10–30)
Albumin: 4.2 g/dL (ref 3.6–5.1)
Alkaline phosphatase (APISO): 61 U/L (ref 31–125)
BUN: 12 mg/dL (ref 7–25)
CO2: 24 mmol/L (ref 20–32)
Calcium: 9 mg/dL (ref 8.6–10.2)
Chloride: 106 mmol/L (ref 98–110)
Creat: 0.86 mg/dL (ref 0.50–0.97)
Globulin: 3.4 g/dL (calc) (ref 1.9–3.7)
Glucose, Bld: 70 mg/dL (ref 65–99)
Potassium: 4.1 mmol/L (ref 3.5–5.3)
Sodium: 136 mmol/L (ref 135–146)
Total Bilirubin: 0.2 mg/dL (ref 0.2–1.2)
Total Protein: 7.6 g/dL (ref 6.1–8.1)

## 2022-04-02 LAB — LIPID PANEL
Cholesterol: 153 mg/dL (ref <200)
HDL: 54 mg/dL (ref >=50)
LDL Cholesterol (Calc): 84 mg/dL (calc)
Non-HDL Cholesterol (Calc): 99 mg/dL (calc) (ref <130)
Total CHOL/HDL Ratio: 2.8 (calc) (ref <5.0)
Triglycerides: 71 mg/dL (ref <150)

## 2022-04-02 LAB — RPR: RPR Ser Ql: NONREACTIVE

## 2022-04-02 LAB — HIV-1 RNA QUANT-NO REFLEX-BLD
HIV 1 RNA Quant: NOT DETECTED Copies/mL
HIV-1 RNA Quant, Log: NOT DETECTED Log cps/mL

## 2022-10-03 ENCOUNTER — Telehealth: Payer: Self-pay

## 2022-10-03 ENCOUNTER — Ambulatory Visit: Payer: Self-pay | Admitting: Internal Medicine

## 2022-10-03 ENCOUNTER — Encounter: Payer: Self-pay | Admitting: Internal Medicine

## 2022-10-03 ENCOUNTER — Other Ambulatory Visit: Payer: Self-pay

## 2022-10-03 VITALS — BP 112/74 | HR 90 | Temp 98.6°F | Wt 281.0 lb

## 2022-10-03 DIAGNOSIS — Z23 Encounter for immunization: Secondary | ICD-10-CM

## 2022-10-03 DIAGNOSIS — B20 Human immunodeficiency virus [HIV] disease: Secondary | ICD-10-CM

## 2022-10-03 LAB — CBC WITH DIFFERENTIAL/PLATELET
Eosinophils Relative: 2.6 %
HCT: 26.8 % — ABNORMAL LOW (ref 35.0–45.0)
MCV: 67.7 fL — ABNORMAL LOW (ref 80.0–100.0)
MPV: 9.9 fL (ref 7.5–12.5)
Platelets: 514 10*3/uL — ABNORMAL HIGH (ref 140–400)
RBC: 3.96 10*6/uL (ref 3.80–5.10)
RDW: 16.4 % — ABNORMAL HIGH (ref 11.0–15.0)

## 2022-10-03 MED ORDER — DOVATO 50-300 MG PO TABS: 1.0000 | ORAL_TABLET | Freq: Every day | ORAL | 2 refills | Status: DC

## 2022-10-03 NOTE — Progress Notes (Unsigned)
[facility]     HPI: Maria Nicholson is a 37 y.o. female with HIV on epzicom and prezcobix. Diagnosed with HIV in 2013. Noted she was initially started on a regimen involving Truvada+ unk drug and had muscle aches then switched to regimen above.  HIV aquired through heterosexual contact.  Today: she reports no missed doses. No new complaints. She is attempting to get pregnant.Sexually active with one partner, states condom broke a couple time.  Past Medical History:  Diagnosis Date   Asthma    Bronchitis    HIV (human immunodeficiency virus infection)    No pertinent past medical history     Past Surgical History:  Procedure Laterality Date   DILATION AND CURETTAGE, DIAGNOSTIC / THERAPEUTIC      Family History  Problem Relation Age of Onset   Cancer Maternal Grandmother        throat   Cancer Father 66       spinal    Cancer Maternal Aunt 62       throat   Current Outpatient Medications on File Prior to Visit  Medication Sig Dispense Refill   abacavir-lamiVUDine (EPZICOM) 600-300 MG tablet Take 1 tablet by mouth daily. 30 tablet 5   cetirizine (ZYRTEC ALLERGY) 10 MG tablet Take 1 tablet (10 mg total) by mouth daily as needed for allergies or rhinitis. 15 tablet 0   darunavir-cobicistat (PREZCOBIX) 800-150 MG tablet Take 1 tablet by mouth daily with breakfast. Swallow whole. Do NOT crush, break or chew tablets. Take with food. 30 tablet 5   ferrous sulfate 325 (65 FE) MG tablet Take 1 tablet (325 mg total) by mouth daily. 30 tablet 0   ibuprofen (ADVIL) 600 MG tablet Take 1 tablet (600 mg total) by mouth every 6 (six) hours as needed. 30 tablet 0   No current facility-administered medications on file prior to visit.    Allergies  Allergen Reactions   Amoxicillin Hives    Did it involve swelling of the face/tongue/throat, SOB, or low BP? No Did it involve sudden or severe rash/hives, skin peeling, or any reaction on the inside of your mouth  or nose? Yes Did you need to seek medical attention at a hospital or doctor's office? Yes When did it last happen?      37 yrs old If all above answers are "NO", may proceed with cephalosporin use.    Coconut Flavor Swelling      Lab Results HIV 1 RNA Quant (Copies/mL)  Date Value  03/30/2022 Not Detected  07/26/2021 Not Detected  03/29/2021 74 (H)   CD4 T Cell Abs (/uL)  Date Value  03/30/2022 651  03/29/2021 815  07/22/2020 628   No results found for: "HIV1GENOSEQ" Lab Results  Component Value Date   WBC 6.8 07/26/2021   HGB 11.1 (L) 07/26/2021   HCT 34.7 (L) 07/26/2021   MCV 88.5 07/26/2021   PLT 406 (H) 07/26/2021    Lab Results  Component Value Date   CREATININE 0.86 03/30/2022   BUN 12 03/30/2022   NA 136 03/30/2022   K 4.1 03/30/2022   CL 106 03/30/2022   CO2 24 03/30/2022   Lab Results  Component Value Date   ALT 9 03/30/2022   AST 14 03/30/2022   ALKPHOS 60 11/12/2018   BILITOT 0.2 03/30/2022    Lab Results  Component Value Date   CHOL 153 03/30/2022   TRIG 71 03/30/2022   HDL 54  03/30/2022   LDLCALC 84 03/30/2022   Lab Results  Component Value Date   HAV NEG 11/15/2011   Lab Results  Component Value Date   HEPBSAG NEGATIVE 11/15/2011   HEPBSAB NEG 11/15/2011   Lab Results  Component Value Date   HCVAB NEGATIVE 11/15/2011   Lab Results  Component Value Date   CHLAMYDIAWP Negative 03/30/2022   CHLAMYDIAWP Negative 03/30/2022   N Negative 03/30/2022   N Negative 03/30/2022   No results found for: "GCPROBEAPT" No results found for: "QUANTGOLD"  Assessment/Plan #HIV #Attempting to get pregnant -CD4 641, VLND, on 10-11/23 Plan -D/C Prezcobix and Epzicom -Start Dovato -Reviewed resistance profile from 2013(RT: K103N, PR: K20I,M36I). Pt met with pharmacy to start Dovato -Follow up in 2 weeks for HIV labs -Pt plans on seeing OB for care    #Vaccination COVID not UTD Flu not UTD PCV 13 5/022019 PCV 23 11/15/2011 Meningitis  today 1st dose 10/03/18 HepA NR 2013, serolgy today HEpB SAB NR in 2013, serology today Tdap not UTD   #Health maintenance -Quantiferon- order today 10/03/22 -RPR-order today 10/03/22 -HCV 2013 NR-order today 10/03/22 -Lipid profile-order today -Pap- Plan sot follow with OB/GYN -GC oral and urine today 10/03/22  [name], MD [facility]   I have personally spent 57 minutes involved in face-to-face and non-face-to-face activities for this patient on the day of the visit. Professional time spent includes the following activities: Preparing to see the patient (review of tests), Obtaining and/or reviewing separately obtained history (admission/discharge record), Performing a medically appropriate examination and/or evaluation , Ordering medications/tests/procedures, referring and communicating with other health care professionals, Documenting clinical information in the EMR, Independently interpreting results (not separately reported), Communicating results to the patient/family/caregiver, Counseling and educating the patient/family/caregiver and Care coordination (not separately reported).

## 2022-10-03 NOTE — Telephone Encounter (Signed)
Counseled patient on Dovato and provided her with the pharmacy card to call if she has questions. Counseled on how to take it, to separate from cations such as iron, magnesium, and calcium, and to take it everyday. She will get it from the [facility] on [address].   Blane Ohara, PharmD  PGY1 Pharmacy Resident

## 2022-10-04 LAB — URINE CYTOLOGY ANCILLARY ONLY
Chlamydia: NEGATIVE
Comment: NEGATIVE
Comment: NORMAL
Neisseria Gonorrhea: NEGATIVE

## 2022-10-04 LAB — CYTOLOGY, (ORAL, ANAL, URETHRAL) ANCILLARY ONLY
Chlamydia: NEGATIVE
Comment: NEGATIVE
Comment: NORMAL
Neisseria Gonorrhea: NEGATIVE

## 2022-10-04 LAB — COMPLETE METABOLIC PANEL WITH GFR
AG Ratio: 1.2 (calc) (ref 1.0–2.5)
AST: 12 U/L (ref 10–30)
Albumin: 4.3 g/dL (ref 3.6–5.1)
Chloride: 105 mmol/L (ref 98–110)

## 2022-10-04 LAB — T-HELPER CELLS (CD4) COUNT (NOT AT ARMC)
CD4 % Helper T Cell: 36 % (ref 33–65)
CD4 T Cell Abs: 667 /uL (ref 400–1790)

## 2022-10-06 LAB — QUANTIFERON-TB GOLD PLUS
Mitogen-NIL: 9.19 IU/mL
NIL: 0.01 IU/mL
QuantiFERON-TB Gold Plus: NEGATIVE
TB1-NIL: 0 IU/mL
TB2-NIL: 0 IU/mL

## 2022-10-06 LAB — COMPLETE METABOLIC PANEL WITH GFR
ALT: 8 U/L (ref 6–29)
Alkaline phosphatase (APISO): 65 U/L (ref 31–125)
BUN: 11 mg/dL (ref 7–25)
CO2: 24 mmol/L (ref 20–32)
Calcium: 9.5 mg/dL (ref 8.6–10.2)
Creat: 0.9 mg/dL (ref 0.50–0.97)
Globulin: 3.5 g/dL (calc) (ref 1.9–3.7)
Glucose, Bld: 105 mg/dL — ABNORMAL HIGH (ref 65–99)
Potassium: 3.9 mmol/L (ref 3.5–5.3)
Sodium: 139 mmol/L (ref 135–146)
Total Bilirubin: 0.3 mg/dL (ref 0.2–1.2)
Total Protein: 7.8 g/dL (ref 6.1–8.1)
eGFR: 86 mL/min/{1.73_m2} (ref >=60)

## 2022-10-06 LAB — LIPID PANEL
Cholesterol: 158 mg/dL (ref <200)
HDL: 59 mg/dL (ref >=50)
LDL Cholesterol (Calc): 82 mg/dL (calc)
Non-HDL Cholesterol (Calc): 99 mg/dL (calc) (ref <130)
Total CHOL/HDL Ratio: 2.7 (calc) (ref <5.0)
Triglycerides: 91 mg/dL (ref <150)

## 2022-10-06 LAB — CBC WITH DIFFERENTIAL/PLATELET
Absolute Monocytes: 745 cells/uL (ref 200–950)
Basophils Absolute: 90 cells/uL (ref 0–200)
Basophils Relative: 1.3 %
Eosinophils Absolute: 179 cells/uL (ref 15–500)
Hemoglobin: 7.7 g/dL — ABNORMAL LOW (ref 11.7–15.5)
Lymphs Abs: 1780 cells/uL (ref 850–3900)
MCH: 19.4 pg — ABNORMAL LOW (ref 27.0–33.0)
MCHC: 28.7 g/dL — ABNORMAL LOW (ref 32.0–36.0)
Monocytes Relative: 10.8 %
Neutro Abs: 4106 cells/uL (ref 1500–7800)
Neutrophils Relative %: 59.5 %
Total Lymphocyte: 25.8 %
WBC: 6.9 10*3/uL (ref 3.8–10.8)

## 2022-10-06 LAB — HIV-1 RNA QUANT-NO REFLEX-BLD
HIV 1 RNA Quant: 58 Copies/mL — ABNORMAL HIGH
HIV-1 RNA Quant, Log: 1.76 Log cps/mL — ABNORMAL HIGH

## 2022-10-06 LAB — HEPATITIS C ANTIBODY: Hepatitis C Ab: NONREACTIVE

## 2022-10-06 LAB — HEPATITIS B SURFACE ANTIGEN: Hepatitis B Surface Ag: NONREACTIVE

## 2022-10-06 LAB — RPR: RPR Ser Ql: NONREACTIVE

## 2022-10-06 LAB — CBC MORPHOLOGY

## 2022-10-06 LAB — HEPATITIS B SURFACE ANTIBODY,QUALITATIVE: Hep B S Ab: NONREACTIVE

## 2022-10-06 LAB — HEPATITIS A ANTIBODY, TOTAL: Hepatitis A AB,Total: REACTIVE — AB

## 2022-10-19 ENCOUNTER — Ambulatory Visit: Payer: Self-pay | Admitting: Internal Medicine

## 2022-11-21 ENCOUNTER — Encounter: Payer: Self-pay | Admitting: Internal Medicine

## 2022-11-21 ENCOUNTER — Ambulatory Visit: Payer: Self-pay | Admitting: Internal Medicine

## 2022-11-21 ENCOUNTER — Other Ambulatory Visit: Payer: Self-pay

## 2022-11-21 VITALS — BP 106/70 | Resp 16 | Ht 67.0 in | Wt 279.0 lb

## 2022-11-21 DIAGNOSIS — Z23 Encounter for immunization: Secondary | ICD-10-CM

## 2022-11-21 DIAGNOSIS — B20 Human immunodeficiency virus [HIV] disease: Secondary | ICD-10-CM

## 2022-11-21 LAB — CBC WITH DIFFERENTIAL/PLATELET
Basophils Relative: 1.5 %
Eosinophils Absolute: 476 cells/uL (ref 15–500)
HCT: 27.7 % — ABNORMAL LOW (ref 35.0–45.0)
Lymphs Abs: 2426 cells/uL (ref 850–3900)
MPV: 10.4 fL (ref 7.5–12.5)
Neutrophils Relative %: 49.3 %
RDW: 17.3 % — ABNORMAL HIGH (ref 11.0–15.0)

## 2022-11-21 LAB — CBC MORPHOLOGY

## 2022-11-21 NOTE — Progress Notes (Signed)
[facility]     HPI: Maria Nicholson is a 37 y.o. female with HIV on epzicom and prezcobix. Diagnosed with HIV in 2013. Noted she was initially started on a regimen involving Truvada+ unk drug and had muscle aches then switched to regimen above.  HIV aquired through heterosexual contact.  Today: Doing well on Dovato. Reports 100% adherence. No new complaints. Past Medical History:  Diagnosis Date   Asthma    Bronchitis    HIV (human immunodeficiency virus infection) ([facility])    No pertinent past medical history     Past Surgical History:  Procedure Laterality Date   DILATION AND CURETTAGE, DIAGNOSTIC / THERAPEUTIC      Family History  Problem Relation Age of Onset   Cancer Maternal Grandmother        throat   Cancer Father 1       spinal    Cancer Maternal Aunt 62       throat   Current Outpatient Medications on File Prior to Visit  Medication Sig Dispense Refill   cetirizine (ZYRTEC ALLERGY) 10 MG tablet Take 1 tablet (10 mg total) by mouth daily as needed for allergies or rhinitis. 15 tablet 0   dolutegravir-lamiVUDine (DOVATO) 50-300 MG tablet Take 1 tablet by mouth daily. 30 tablet 2   ferrous sulfate 325 (65 FE) MG tablet Take 1 tablet (325 mg total) by mouth daily. 30 tablet 0   ibuprofen (ADVIL) 600 MG tablet Take 1 tablet (600 mg total) by mouth every 6 (six) hours as needed. 30 tablet 0   No current facility-administered medications on file prior to visit.    Allergies  Allergen Reactions   Amoxicillin Hives    Did it involve swelling of the face/tongue/throat, SOB, or low BP? No Did it involve sudden or severe rash/hives, skin peeling, or any reaction on the inside of your mouth or nose? Yes Did you need to seek medical attention at a hospital or doctor's office? Yes When did it last happen?      37 yrs old If all above answers are "NO", may proceed with cephalosporin use.    Coconut Flavor Swelling      Lab Results HIV 1  RNA Quant (Copies/mL)  Date Value  10/03/2022 58 (H)  03/30/2022 Not Detected  07/26/2021 Not Detected   CD4 T Cell Abs (/uL)  Date Value  10/03/2022 667  03/30/2022 651  03/29/2021 815   No results found for: "HIV1GENOSEQ" Lab Results  Component Value Date   WBC 6.9 10/03/2022   HGB 7.7 (L) 10/03/2022   HCT 26.8 (L) 10/03/2022   MCV 67.7 (L) 10/03/2022   PLT 514 (H) 10/03/2022    Lab Results  Component Value Date   CREATININE 0.90 10/03/2022   BUN 11 10/03/2022   NA 139 10/03/2022   K 3.9 10/03/2022   CL 105 10/03/2022   CO2 24 10/03/2022   Lab Results  Component Value Date   ALT 8 10/03/2022   AST 12 10/03/2022   ALKPHOS 60 11/12/2018   BILITOT 0.3 10/03/2022    Lab Results  Component Value Date   CHOL 158 10/03/2022   TRIG 91 10/03/2022   HDL 59 10/03/2022   LDLCALC 82 10/03/2022   Lab Results  Component Value Date   HAV REACTIVE (A) 10/03/2022   Lab Results  Component Value Date   HEPBSAG NON-REACTIVE 10/03/2022   HEPBSAB NON-REACTIVE 10/03/2022   Lab Results  Component Value Date   HCVAB NEGATIVE 11/15/2011   Lab Results  Component Value Date   CHLAMYDIAWP Negative 10/03/2022   N Negative 10/03/2022   No results found for: "GCPROBEAPT" No results found for: "QUANTGOLD"  Assessment/Plan #HIV #Attempting to get pregnant -CD4 667 VL54 , on 10/03/22 Plan -D/C Prezcobix and Epzicom and started Dovato on 10/03/22 as pt is trying to get pregnant -Reviewed resistance profile from 2013(RT: K103N, PR: K20I,M36I). Pt met with pharmacy to start Dovato at last visit Plan: -HIV labs to day -Follow up in 3 months -Plans to f/u with GYN        #Vaccination COVID not UTD Flu not UTD PCV 13 5/022019 PCV 23 11/15/2011 Meningitis today 1st dose 10/03/18, 2nd dose next visit HepA NR 2013, immun e4/15/24 HEpB SAB NR 10/03/22. Start series today 11/21/22 Tdap not UTD     #Health maintenance -Quantiferon- order today 10/03/22 -RPR-order today  10/03/22 -HCV 2013 NR-order today 10/03/22 -Lipid profile-order today -Pap- Plan sot follow with OB/GYN -GC oral and urine today 10/03/22   [name], MD [facility]   I have personally spent 36 minutes involved in face-to-face and non-face-to-face activities for this patient on the day of the visit. Professional time spent includes the following activities: Preparing to see the patient (review of tests), Obtaining and/or reviewing separately obtained history (admission/discharge record), Performing a medically appropriate examination and/or evaluation , Ordering medications/tests/procedures, referring and communicating with other health care professionals, Documenting clinical information in the EMR, Independently interpreting results (not separately reported), Communicating results to the patient/family/caregiver, Counseling and educating the patient/family/caregiver and Care coordination (not separately reported).

## 2022-11-21 NOTE — Patient Instructions (Signed)
Lab work today.  Next follow up in 3 months.  Hep B vaccine today.

## 2022-11-22 LAB — COMPLETE METABOLIC PANEL WITH GFR
AG Ratio: 1.3 (calc) (ref 1.0–2.5)
Albumin: 4.2 g/dL (ref 3.6–5.1)
CO2: 22 mmol/L (ref 20–32)
Chloride: 104 mmol/L (ref 98–110)
Creat: 1.06 mg/dL — ABNORMAL HIGH (ref 0.50–0.97)
Glucose, Bld: 83 mg/dL (ref 65–99)

## 2022-11-22 LAB — T-HELPER CELLS (CD4) COUNT (NOT AT ARMC)
CD4 % Helper T Cell: 34 % (ref 33–65)
CD4 T Cell Abs: 764 /uL (ref 400–1790)

## 2022-11-25 LAB — CBC WITH DIFFERENTIAL/PLATELET
Absolute Monocytes: 936 cells/uL (ref 200–950)
Basophils Absolute: 117 cells/uL (ref 0–200)
Eosinophils Relative: 6.1 %
Hemoglobin: 7.8 g/dL — ABNORMAL LOW (ref 11.7–15.5)
MCH: 19.4 pg — ABNORMAL LOW (ref 27.0–33.0)
MCHC: 28.2 g/dL — ABNORMAL LOW (ref 32.0–36.0)
MCV: 68.9 fL — ABNORMAL LOW (ref 80.0–100.0)
Monocytes Relative: 12 %
Neutro Abs: 3845 cells/uL (ref 1500–7800)
Platelets: 571 10*3/uL — ABNORMAL HIGH (ref 140–400)
RBC: 4.02 10*6/uL (ref 3.80–5.10)
Total Lymphocyte: 31.1 %
WBC: 7.8 10*3/uL (ref 3.8–10.8)

## 2022-11-25 LAB — COMPLETE METABOLIC PANEL WITH GFR
ALT: 11 U/L (ref 6–29)
AST: 14 U/L (ref 10–30)
Alkaline phosphatase (APISO): 73 U/L (ref 31–125)
BUN/Creatinine Ratio: 13 (calc) (ref 6–22)
BUN: 14 mg/dL (ref 7–25)
Calcium: 9.2 mg/dL (ref 8.6–10.2)
Globulin: 3.3 g/dL (calc) (ref 1.9–3.7)
Potassium: 4.2 mmol/L (ref 3.5–5.3)
Sodium: 136 mmol/L (ref 135–146)
Total Bilirubin: 0.3 mg/dL (ref 0.2–1.2)
Total Protein: 7.5 g/dL (ref 6.1–8.1)
eGFR: 71 mL/min/{1.73_m2} (ref >=60)

## 2022-11-25 LAB — HIV-1 RNA QUANT-NO REFLEX-BLD
HIV 1 RNA Quant: <20 Copies/mL — ABNORMAL HIGH
HIV-1 RNA Quant, Log: <1.3 Log cps/mL — ABNORMAL HIGH

## 2022-12-28 ENCOUNTER — Other Ambulatory Visit: Payer: Self-pay

## 2022-12-28 ENCOUNTER — Ambulatory Visit: Payer: Self-pay

## 2022-12-28 ENCOUNTER — Telehealth: Payer: Self-pay

## 2022-12-28 DIAGNOSIS — N898 Other specified noninflammatory disorders of vagina: Secondary | ICD-10-CM

## 2022-12-28 NOTE — Telephone Encounter (Signed)
Patient called complaining of vaginal discharge and odor x 3 days. Patient reports he discharge is yellow and white. Patient denies being exposed to any known STIs and states she has not been sexual active.  Per Tammy Sours, NP ok for the patient to come in and do a vaginal swab with the lab. Patient scheduled to come today for a nurse visit. Talajah Slimp T Pricilla Loveless

## 2022-12-28 NOTE — Progress Notes (Signed)
Nurse Visit  Maria Nicholson 1985/10/16  Vaginal swab self-collected by patient.   Sandie Ano, RN

## 2022-12-29 LAB — CERVICOVAGINAL ANCILLARY ONLY
Bacterial Vaginitis (gardnerella): NEGATIVE
Candida Glabrata: NEGATIVE
Candida Vaginitis: NEGATIVE
Chlamydia: NEGATIVE
Comment: NEGATIVE
Comment: NEGATIVE
Comment: NEGATIVE
Comment: NEGATIVE
Comment: NEGATIVE
Comment: NORMAL
Neisseria Gonorrhea: NEGATIVE
Trichomonas: NEGATIVE

## 2023-01-24 ENCOUNTER — Telehealth: Payer: Self-pay

## 2023-01-24 NOTE — Telephone Encounter (Signed)
Returned patient's telephone call. Left a voice message with [facility] contact information.

## 2023-01-26 ENCOUNTER — Encounter: Payer: Self-pay | Admitting: Obstetrics and Gynecology

## 2023-02-15 ENCOUNTER — Other Ambulatory Visit: Payer: Self-pay | Admitting: Hematology and Oncology

## 2023-02-15 DIAGNOSIS — Z124 Encounter for screening for malignant neoplasm of cervix: Secondary | ICD-10-CM

## 2023-02-15 NOTE — Progress Notes (Signed)
Patient: Maria Nicholson           Date of Birth: 06-11-1986           MRN: [id number] Visit Date: 02/15/2023 PCP: Patient, No Pcp Per  Cervical Cancer Screening Do you smoke?: No Have you ever had or been told you have an allergy to latex products?: Yes Marital status: Single Date of last pap smear: 2-5 yrs ago Date of last menstrual period: 12/30/22 Number of pregnancies: 2 Number of births: 0 Have you ever had any of the following? Hysterectomy: No Tubal ligation (tubes tied): No Abnormal bleeding: Yes Abnormal pap smear: No Venereal warts: No A sex partner with venereal warts: No  Cervical Exam Pap smear completed: Pap test Abnormal Observations: Normal exam. Recommendations: Repeat in 5 years if normal and HPV-.       Patient's History Patient Active Problem List   Diagnosis Date Noted   Tongue lesion 09/10/2019   Vaginal itching 01/29/2019   Healthcare maintenance 11/27/2018   Fibroids 11/27/2018   Human immunodeficiency virus (HIV) disease ([facility]) 11/06/2017   Eczema 11/17/2011   Past Medical History:  Diagnosis Date   Asthma    Bronchitis    HIV (human immunodeficiency virus infection) ([facility])    No pertinent past medical history     Family History  Problem Relation Age of Onset   Cancer Maternal Grandmother        throat   Cancer Father 24       spinal    Cancer Maternal Aunt 50       throat    Social History   Occupational History   Not on file  Tobacco Use   Smoking status: Former    Current packs/day: 0.15    Average packs/day: 0.2 packs/day for 2.0 years (0.3 ttl pk-yrs)    Types: Cigarettes   Smokeless tobacco: Never   Tobacco comments:    decreased from one pack daily     Vaping Use   Vaping status: Never Used  Substance and Sexual Activity   Alcohol use: Yes    Comment: Occasionally    Drug use: Not Currently    Types: Marijuana   Sexual activity: Yes    Partners: Male    Birth control/protection: None, Condom

## 2023-02-21 ENCOUNTER — Ambulatory Visit: Payer: Self-pay | Admitting: Internal Medicine

## 2023-02-23 LAB — CYTOLOGY - PAP
Adequacy: ABSENT
Comment: NEGATIVE
Diagnosis: NEGATIVE
High risk HPV: NEGATIVE

## 2023-02-28 ENCOUNTER — Encounter: Payer: Self-pay | Admitting: Internal Medicine

## 2023-02-28 ENCOUNTER — Ambulatory Visit (INDEPENDENT_AMBULATORY_CARE_PROVIDER_SITE_OTHER): Payer: Self-pay | Admitting: Internal Medicine

## 2023-02-28 ENCOUNTER — Other Ambulatory Visit: Payer: Self-pay

## 2023-02-28 VITALS — BP 117/78 | HR 92 | Resp 16 | Ht 67.0 in | Wt 279.0 lb

## 2023-02-28 DIAGNOSIS — Z23 Encounter for immunization: Secondary | ICD-10-CM

## 2023-02-28 DIAGNOSIS — B20 Human immunodeficiency virus [HIV] disease: Secondary | ICD-10-CM

## 2023-02-28 MED ORDER — DOVATO 50-300 MG PO TABS: 1.0000 | ORAL_TABLET | Freq: Every day | ORAL | 5 refills | Status: DC

## 2023-02-28 NOTE — Progress Notes (Signed)
[facility]     HPI: Maria Nicholson is a 37 y.o. female female with HIV on dovato. Diagnosed with HIV in 2013. Noted she was initially started on a regimen involving Truvada+ unk drug and had muscle aches then switched to regimen above.  HIV aquired through heterosexual contact.  Today 02/28/23: missed 3 doses of dovato this month as did not have refillr, . Pt is sexaully active with parten,  sometimes uses condoms. Past Medical History:  Diagnosis Date   Asthma    Bronchitis    HIV (human immunodeficiency virus infection) (HCC)    No pertinent past medical history     Past Surgical History:  Procedure Laterality Date   DILATION AND CURETTAGE, DIAGNOSTIC / THERAPEUTIC      Family History  Problem Relation Age of Onset   Cancer Maternal Grandmother        throat   Cancer Father 48       spinal    Cancer Maternal Aunt 62       throat   Current Outpatient Medications on File Prior to Visit  Medication Sig Dispense Refill   cetirizine (ZYRTEC ALLERGY) 10 MG tablet Take 1 tablet (10 mg total) by mouth daily as needed for allergies or rhinitis. 15 tablet 0   dolutegravir-lamiVUDine (DOVATO) 50-300 MG tablet Take 1 tablet by mouth daily. 30 tablet 2   ferrous sulfate 325 (65 FE) MG tablet Take 1 tablet (325 mg total) by mouth daily. 30 tablet 0   ibuprofen (ADVIL) 600 MG tablet Take 1 tablet (600 mg total) by mouth every 6 (six) hours as needed. 30 tablet 0   No current facility-administered medications on file prior to visit.    Allergies  Allergen Reactions   Amoxicillin Hives    Did it involve swelling of the face/tongue/throat, SOB, or low BP? No Did it involve sudden or severe rash/hives, skin peeling, or any reaction on the inside of your mouth or nose? Yes Did you need to seek medical attention at a hospital or doctor's office? Yes When did it last happen?      37 yrs old If all above answers are "NO", may proceed with cephalosporin  use.    Coconut Flavor Swelling      Lab Results HIV 1 RNA Quant (Copies/mL)  Date Value  11/21/2022 <20 (H)  10/03/2022 58 (H)  03/30/2022 Not Detected   CD4 T Cell Abs (/uL)  Date Value  11/21/2022 764  10/03/2022 667  03/30/2022 651   No results found for: "HIV1GENOSEQ" Lab Results  Component Value Date   WBC 7.8 11/21/2022   HGB 7.8 (L) 11/21/2022   HCT 27.7 (L) 11/21/2022   MCV 68.9 (L) 11/21/2022   PLT 571 (H) 11/21/2022    Lab Results  Component Value Date   CREATININE 1.06 (H) 11/21/2022   BUN 14 11/21/2022   NA 136 11/21/2022   K 4.2 11/21/2022   CL 104 11/21/2022   CO2 22 11/21/2022   Lab Results  Component Value Date   ALT 11 11/21/2022   AST 14 11/21/2022   ALKPHOS 60 11/12/2018   BILITOT 0.3 11/21/2022    Lab Results  Component Value Date   CHOL 158 10/03/2022   TRIG 91 10/03/2022   HDL 59 10/03/2022   LDLCALC 82 10/03/2022   Lab Results  Component Value Date   HAV REACTIVE (A) 10/03/2022   Lab Results  Component Value Date  HEPBSAG NON-REACTIVE 10/03/2022   HEPBSAB NON-REACTIVE 10/03/2022   Lab Results  Component Value Date   HCVAB NEGATIVE 11/15/2011   Lab Results  Component Value Date   CHLAMYDIAWP Negative 12/28/2022   N Negative 12/28/2022   No results found for: "GCPROBEAPT" No results found for: "QUANTGOLD"  Assessment/Plan #HIV -CD4 764, VL nd, on 11/21/22 -On dovato Plan -labs today -Follow up in 6 months  #Vaccination COVID not UTD Flu declined.  PCV 13 5/022019 PCV 23 11/15/2011 Meningitis today 1st dose 10/03/18, 2nd dose next visit HepA NR 2013, immun 10/03/22 HEpB SAB NR 10/03/22.1st odse 11/21/22, 2nd dose today Tdap not UTD  #Health maintenance -Quantiferon negative 10/03/22 -RPR negative 10/03/22 -HCV negative 10/03/22 -GC urine negative 12/28/22 -Lipid The ASCVD Risk score (Arnett DK, et al., 2019) failed to calculate for the following reasons:   The 2019 ASCVD risk score is only valid for ages 32 to  58  -Dysplasia screen F-> pt doesnot have insurance to see ob. Plans on establishing primary care. [facility]a [facility] 20[facility] -Mammogram      [name], MD [facility]   I have personally spent 41 minutes involved in face-to-face and non-face-to-face activities for this patient on the day of the visit. Professional time spent includes the following activities: Preparing to see the patient (review of tests), Obtaining and/or reviewing separately obtained history (admission/discharge record), Performing a medically appropriate examination and/or evaluation , Ordering medications/tests/procedures, referring and communicating with other health care professionals, Documenting clinical information in the EMR, Independently interpreting results (not separately reported), Communicating results to the patient/family/caregiver, Counseling and educating the patient/family/caregiver and Care coordination (not separately reported).

## 2023-03-02 LAB — COMPLETE METABOLIC PANEL WITH GFR
AG Ratio: 1.2 (calc) (ref 1.0–2.5)
ALT: 10 U/L (ref 6–29)
AST: 13 U/L (ref 10–30)
Albumin: 4.1 g/dL (ref 3.6–5.1)
Alkaline phosphatase (APISO): 63 U/L (ref 31–125)
BUN/Creatinine Ratio: 12 (calc) (ref 6–22)
BUN: 13 mg/dL (ref 7–25)
CO2: 28 mmol/L (ref 20–32)
Calcium: 9.6 mg/dL (ref 8.6–10.2)
Chloride: 104 mmol/L (ref 98–110)
Creat: 1.05 mg/dL — ABNORMAL HIGH (ref 0.50–0.97)
Globulin: 3.4 g/dL (ref 1.9–3.7)
Glucose, Bld: 99 mg/dL (ref 65–99)
Potassium: 4.3 mmol/L (ref 3.5–5.3)
Sodium: 139 mmol/L (ref 135–146)
Total Bilirubin: 0.2 mg/dL (ref 0.2–1.2)
Total Protein: 7.5 g/dL (ref 6.1–8.1)
eGFR: 72 mL/min/{1.73_m2} (ref >=60)

## 2023-03-02 LAB — CBC WITH DIFFERENTIAL/PLATELET
Absolute Monocytes: 682 {cells}/uL (ref 200–950)
Basophils Absolute: 92 {cells}/uL (ref 0–200)
Basophils Relative: 1.3 %
Eosinophils Absolute: 249 {cells}/uL (ref 15–500)
Eosinophils Relative: 3.5 %
HCT: 30.5 % — ABNORMAL LOW (ref 35.0–45.0)
Hemoglobin: 9.3 g/dL — ABNORMAL LOW (ref 11.7–15.5)
Lymphs Abs: 1924 {cells}/uL (ref 850–3900)
MCH: 23.4 pg — ABNORMAL LOW (ref 27.0–33.0)
MCHC: 30.5 g/dL — ABNORMAL LOW (ref 32.0–36.0)
MCV: 76.8 fL — ABNORMAL LOW (ref 80.0–100.0)
MPV: 10.9 fL (ref 7.5–12.5)
Monocytes Relative: 9.6 %
Neutro Abs: 4154 {cells}/uL (ref 1500–7800)
Neutrophils Relative %: 58.5 %
Platelets: 467 10*3/uL — ABNORMAL HIGH (ref 140–400)
RBC: 3.97 10*6/uL (ref 3.80–5.10)
RDW: 20 % — ABNORMAL HIGH (ref 11.0–15.0)
Total Lymphocyte: 27.1 %
WBC: 7.1 10*3/uL (ref 3.8–10.8)

## 2023-03-02 LAB — HIV-1 RNA QUANT-NO REFLEX-BLD
HIV 1 RNA Quant: <20 {copies}/mL — ABNORMAL HIGH
HIV-1 RNA Quant, Log: <1.3 {Log_copies}/mL — ABNORMAL HIGH

## 2023-03-02 LAB — T-HELPER CELLS (CD4) COUNT (NOT AT ARMC)
CD4 % Helper T Cell: 38 % (ref 33–65)
CD4 T Cell Abs: 724 /uL (ref 400–1790)

## 2023-07-06 ENCOUNTER — Ambulatory Visit: Payer: Self-pay | Admitting: Family Medicine

## 2023-07-10 ENCOUNTER — Ambulatory Visit: Payer: Self-pay

## 2023-07-10 ENCOUNTER — Other Ambulatory Visit (HOSPITAL_COMMUNITY): Payer: Self-pay

## 2023-07-10 ENCOUNTER — Other Ambulatory Visit: Payer: Self-pay

## 2023-07-12 ENCOUNTER — Other Ambulatory Visit: Payer: Self-pay | Admitting: Pharmacist

## 2023-07-12 DIAGNOSIS — B20 Human immunodeficiency virus [HIV] disease: Secondary | ICD-10-CM

## 2023-07-12 MED ORDER — DOVATO 50-300 MG PO TABS: 1.0000 | ORAL_TABLET | Freq: Every day | ORAL | Status: AC

## 2023-07-12 NOTE — Progress Notes (Signed)
Medication Samples have been provided to the patient.  Drug name: Dovato        Strength: 50/300 mg         Qty: 28 tablets (2 bottles)   LOT: [id number]   Exp.Date: 12/2024  Samples requested by Burgess Amor, financial counselor.  Dosing instructions: Take one tablet by mouth once daily  The patient has been instructed regarding the correct time, dose, and frequency of taking this medication, including desired effects and most common side effects.   Samaya Boardley L. Jannette Fogo, PharmD, BCIDP, AAHIVP, CPP Clinical Pharmacist Practitioner Infectious Diseases Clinical Pharmacist [facility] 06/01/2020, 10:07 AM

## 2023-08-24 ENCOUNTER — Ambulatory Visit: Payer: Self-pay | Admitting: Internal Medicine

## 2023-09-07 ENCOUNTER — Other Ambulatory Visit: Payer: Self-pay | Admitting: Pharmacist

## 2023-09-07 DIAGNOSIS — B20 Human immunodeficiency virus [HIV] disease: Secondary | ICD-10-CM

## 2023-09-07 MED ORDER — DOVATO 50-300 MG PO TABS
1.0000 | ORAL_TABLET | Freq: Every day | ORAL | Status: AC
Start: 2023-09-04 — End: 2023-09-18

## 2023-09-07 NOTE — Progress Notes (Signed)
 Medication Samples have been provided to the patient.  Drug name: Dovato        Strength: 50/300 mg         Qty: 14  Tablets (1 bottles) LOT: [id number]   Exp.Date: 7/26  Samples requested by Lupita Leash.  Dosing instructions: Take one tablet by mouth once daily  The patient has been instructed regarding the correct time, dose, and frequency of taking this medication, including desired effects and most common side effects.   Margarite Gouge, PharmD, CPP, BCIDP, AAHIVP Clinical Pharmacist Practitioner Infectious Diseases Clinical Pharmacist [facility]

## 2023-09-19 ENCOUNTER — Other Ambulatory Visit: Payer: Self-pay

## 2023-09-19 ENCOUNTER — Encounter: Payer: Self-pay | Admitting: Internal Medicine

## 2023-09-19 ENCOUNTER — Ambulatory Visit: Payer: Self-pay | Admitting: Internal Medicine

## 2023-09-19 VITALS — BP 130/84 | HR 90 | Temp 97.9°F | Wt 293.0 lb

## 2023-09-19 DIAGNOSIS — Z23 Encounter for immunization: Secondary | ICD-10-CM

## 2023-09-19 DIAGNOSIS — B20 Human immunodeficiency virus [HIV] disease: Secondary | ICD-10-CM | POA: Diagnosis present

## 2023-09-19 MED ORDER — DOVATO 50-300 MG PO TABS: 1.0000 | ORAL_TABLET | Freq: Every day | ORAL | 6 refills | Status: DC

## 2023-09-19 NOTE — Progress Notes (Signed)
 [facility]     HPI: Maria Nicholson is a 38 y.o. female female with HIV on dovato. Diagnosed with HIV in 2013. Noted she was initially started on a regimen involving Truvada+ unk drug and had muscle aches then switched to regimen above.  HIV aquired through heterosexual contact.  02/28/23: missed 3 doses of dovato this month as did not have refillr, . Pt is sexaully active with parten,  sometimes uses condoms. Today 09/19/23: PT missed  2 weeks total since visit due to meds not being up. Sexually active  with boyfreind, use condoms. Pt currently not working. Social drinker.  Past Medical History:  Diagnosis Date   Asthma    Bronchitis    HIV (human immunodeficiency virus infection) (HCC)    No pertinent past medical history     Past Surgical History:  Procedure Laterality Date   DILATION AND CURETTAGE, DIAGNOSTIC / THERAPEUTIC      Family History  Problem Relation Age of Onset   Cancer Maternal Grandmother        throat   Cancer Father 5       spinal    Cancer Maternal Aunt 62       throat   Current Outpatient Medications on File Prior to Visit  Medication Sig Dispense Refill   cetirizine (ZYRTEC ALLERGY) 10 MG tablet Take 1 tablet (10 mg total) by mouth daily as needed for allergies or rhinitis. 15 tablet 0   dolutegravir-lamiVUDine (DOVATO) 50-300 MG tablet Take 1 tablet by mouth daily. 30 tablet 5   ferrous sulfate 325 (65 FE) MG tablet Take 1 tablet (325 mg total) by mouth daily. 30 tablet 0   ibuprofen (ADVIL) 600 MG tablet Take 1 tablet (600 mg total) by mouth every 6 (six) hours as needed. 30 tablet 0   No current facility-administered medications on file prior to visit.    Allergies  Allergen Reactions   Amoxicillin Hives    Did it involve swelling of the face/tongue/throat, SOB, or low BP? No Did it involve sudden or severe rash/hives, skin peeling, or any reaction on the inside of your mouth or nose? Yes Did you need to seek  medical attention at a hospital or doctor's office? Yes When did it last happen?      38 yrs old If all above answers are "NO", may proceed with cephalosporin use.    Coconut Flavoring Agent (Non-Screening) Swelling      Lab Results HIV 1 RNA Quant (Copies/mL)  Date Value  02/28/2023 <20 (H)  11/21/2022 <20 (H)  10/03/2022 58 (H)   CD4 T Cell Abs (/uL)  Date Value  02/28/2023 724  11/21/2022 764  10/03/2022 667   No results found for: "HIV1GENOSEQ" Lab Results  Component Value Date   WBC 7.1 02/28/2023   HGB 9.3 (L) 02/28/2023   HCT 30.5 (L) 02/28/2023   MCV 76.8 (L) 02/28/2023   PLT 467 (H) 02/28/2023    Lab Results  Component Value Date   CREATININE 1.05 (H) 02/28/2023   BUN 13 02/28/2023   NA 139 02/28/2023   K 4.3 02/28/2023   CL 104 02/28/2023   CO2 28 02/28/2023   Lab Results  Component Value Date   ALT 10 02/28/2023   AST 13 02/28/2023   ALKPHOS 60 11/12/2018   BILITOT 0.2 02/28/2023    Lab Results  Component Value Date   CHOL 158 10/03/2022   TRIG 91 10/03/2022  HDL 59 10/03/2022   LDLCALC 82 10/03/2022   Lab Results  Component Value Date   HAV REACTIVE (A) 10/03/2022   Lab Results  Component Value Date   HEPBSAG NON-REACTIVE 10/03/2022   HEPBSAB NON-REACTIVE 10/03/2022   Lab Results  Component Value Date   HCVAB NEGATIVE 11/15/2011   Lab Results  Component Value Date   CHLAMYDIAWP Negative 12/28/2022   N Negative 12/28/2022   No results found for: "GCPROBEAPT" No results found for: "QUANTGOLD"  Assessment/Plan #HIV -CD4 724, VL nd, on 02/28/23 -On dovato Plan -labs today -Follow up in 6 months   #Vaccination COVID not UTD Flu declined.  PCV 13 5/022019 PCV 23 11/15/2011 PCV -20 declined Meningitis today 1st dose 10/03/22, 2nd dose today HepA NR 2013, immun 10/03/22 HEpB SAB NR 10/03/22.1st odse 11/21/22, 2nd dose 11/21/22 Tdap not UTD   #Health maintenance -Quantiferon negative 10/03/22, order today 09/19/23 -RPR negative  10/03/22, order today 09/19/23 -HCV negative 10/03/22, order today 09/19/23 -GC urine negative 12/28/22 -Lipid The ASCVD Risk score (Arnett DK, et al., 2019) failed to calculate for the following reasons:   The 2019 ASCVD risk score is only valid for ages 31 to 25  -Dysplasia screen F->  She now has medicaid so plans to see obgyn -Mammogram    [name], MD [facility] I have personally spent 45 minutes involved in face-to-face and non-face-to-face activities for this patient on the day of the visit. Professional time spent includes the following activities: Preparing to see the patient (review of tests), Obtaining and/or reviewing separately obtained history (admission/discharge record), Performing a medically appropriate examination and/or evaluation , Ordering medications/tests/procedures, referring and communicating with other health care professionals, Documenting clinical information in the EMR, Independently interpreting results (not separately reported), Communicating results to the patient/family/caregiver, Counseling and educating the patient/family/caregiver and Care coordination (not separately reported).

## 2023-09-20 LAB — T-HELPER CELLS (CD4) COUNT (NOT AT ARMC)
CD4 % Helper T Cell: 36 % (ref 33–65)
CD4 T Cell Abs: 674 /uL (ref 400–1790)

## 2023-09-21 LAB — CBC WITH DIFFERENTIAL/PLATELET
Absolute Lymphocytes: 1905 {cells}/uL (ref 850–3900)
Absolute Monocytes: 1146 {cells}/uL — ABNORMAL HIGH (ref 200–950)
Basophils Absolute: 102 {cells}/uL (ref 0–200)
Basophils Relative: 1.4 %
Eosinophils Absolute: 343 {cells}/uL (ref 15–500)
Eosinophils Relative: 4.7 %
HCT: 27.8 % — ABNORMAL LOW (ref 35.0–45.0)
Hemoglobin: 8.5 g/dL — ABNORMAL LOW (ref 11.7–15.5)
MCH: 23.6 pg — ABNORMAL LOW (ref 27.0–33.0)
MCHC: 30.6 g/dL — ABNORMAL LOW (ref 32.0–36.0)
MCV: 77.2 fL — ABNORMAL LOW (ref 80.0–100.0)
MPV: 10.7 fL (ref 7.5–12.5)
Monocytes Relative: 15.7 %
Neutro Abs: 3803 {cells}/uL (ref 1500–7800)
Neutrophils Relative %: 52.1 %
Platelets: 513 10*3/uL — ABNORMAL HIGH (ref 140–400)
RBC: 3.6 10*6/uL — ABNORMAL LOW (ref 3.80–5.10)
RDW: 15.8 % — ABNORMAL HIGH (ref 11.0–15.0)
Total Lymphocyte: 26.1 %
WBC: 7.3 10*3/uL (ref 3.8–10.8)

## 2023-09-21 LAB — COMPLETE METABOLIC PANEL WITHOUT GFR
AG Ratio: 1.1 (calc) (ref 1.0–2.5)
ALT: 11 U/L (ref 6–29)
AST: 13 U/L (ref 10–30)
Albumin: 4.2 g/dL (ref 3.6–5.1)
Alkaline phosphatase (APISO): 66 U/L (ref 31–125)
BUN/Creatinine Ratio: 12 (calc) (ref 6–22)
BUN: 14 mg/dL (ref 7–25)
CO2: 25 mmol/L (ref 20–32)
Calcium: 9.4 mg/dL (ref 8.6–10.2)
Chloride: 105 mmol/L (ref 98–110)
Creat: 1.21 mg/dL — ABNORMAL HIGH (ref 0.50–0.97)
Globulin: 3.7 g/dL (ref 1.9–3.7)
Glucose, Bld: 90 mg/dL (ref 65–99)
Potassium: 4.3 mmol/L (ref 3.5–5.3)
Sodium: 138 mmol/L (ref 135–146)
Total Bilirubin: 0.2 mg/dL (ref 0.2–1.2)
Total Protein: 7.9 g/dL (ref 6.1–8.1)

## 2023-09-21 LAB — HIV-1 RNA QUANT-NO REFLEX-BLD
HIV 1 RNA Quant: NOT DETECTED {copies}/mL
HIV-1 RNA Quant, Log: NOT DETECTED {Log_copies}/mL

## 2023-09-21 LAB — QUANTIFERON-TB GOLD PLUS
Mitogen-NIL: 3.8 [IU]/mL
NIL: 0.01 [IU]/mL
QuantiFERON-TB Gold Plus: NEGATIVE
TB1-NIL: 0 [IU]/mL
TB2-NIL: 0 [IU]/mL

## 2023-09-21 LAB — HEPATITIS C ANTIBODY: Hepatitis C Ab: NONREACTIVE

## 2023-09-21 LAB — RPR: RPR Ser Ql: NONREACTIVE

## 2023-10-24 ENCOUNTER — Encounter: Payer: Self-pay | Admitting: Family Medicine

## 2023-10-24 ENCOUNTER — Ambulatory Visit: Payer: Self-pay | Attending: Family Medicine | Admitting: Family Medicine

## 2023-10-24 VITALS — BP 130/83 | HR 85 | Ht 67.0 in | Wt 291.0 lb

## 2023-10-24 DIAGNOSIS — Z13228 Encounter for screening for other metabolic disorders: Secondary | ICD-10-CM

## 2023-10-24 DIAGNOSIS — L708 Other acne: Secondary | ICD-10-CM

## 2023-10-24 DIAGNOSIS — Z131 Encounter for screening for diabetes mellitus: Secondary | ICD-10-CM

## 2023-10-24 DIAGNOSIS — D649 Anemia, unspecified: Secondary | ICD-10-CM | POA: Insufficient documentation

## 2023-10-24 DIAGNOSIS — J4520 Mild intermittent asthma, uncomplicated: Secondary | ICD-10-CM | POA: Diagnosis not present

## 2023-10-24 DIAGNOSIS — D508 Other iron deficiency anemias: Secondary | ICD-10-CM | POA: Diagnosis not present

## 2023-10-24 DIAGNOSIS — J45909 Unspecified asthma, uncomplicated: Secondary | ICD-10-CM | POA: Insufficient documentation

## 2023-10-24 DIAGNOSIS — B20 Human immunodeficiency virus [HIV] disease: Secondary | ICD-10-CM | POA: Diagnosis not present

## 2023-10-24 DIAGNOSIS — D219 Benign neoplasm of connective and other soft tissue, unspecified: Secondary | ICD-10-CM | POA: Diagnosis not present

## 2023-10-24 MED ORDER — FERROUS SULFATE 325 (65 FE) MG PO TABS: 325.0000 mg | ORAL_TABLET | Freq: Two times a day (BID) | ORAL | 1 refills | Status: AC

## 2023-10-24 NOTE — Patient Instructions (Signed)
 VISIT SUMMARY:  Today, you came in for a wellness visit and to discuss your concerns about uterine fibroids and iron deficiency anemia. We also reviewed your asthma, eczema, and HIV management. You were accompanied by your mother.  YOUR PLAN:  -IRON DEFICIENCY ANEMIA: Iron deficiency anemia means you have a lower than normal amount of iron in your blood, which can cause fatigue and weakness. Your anemia is likely due to heavy menstrual bleeding from your uterine fibroids. We have prescribed an iron supplement to be taken twice daily and advised you to continue your over-the-counter iron until you get the prescription filled.  -UTERINE FIBROIDS: Uterine fibroids are non-cancerous growths in the uterus that can cause heavy menstrual bleeding and pain. We will refer you to an OB GYN to discuss treatment options that can help manage your symptoms while considering your fertility.  -ASTHMA: Asthma is a condition where your airways narrow and swell, making it difficult to breathe. Your asthma is well-controlled with Zyrtec , and no changes to your current management are needed.  -ECZEMA: Eczema is a condition that makes your skin red and itchy. It is common in people with asthma. Your eczema is currently under control, and no changes to your treatment are needed.  -HIV INFECTION: HIV is a virus that attacks the body's immune system. Your HIV is being managed by your infectious disease specialist, and there are no acute issues at this time. Continue your follow-ups and schedule your annual visit for screenings and management.  -GENERAL HEALTH MAINTENANCE: We discussed the importance of regular screenings and preventive care. Labs have been ordered for cholesterol and diabetes screening due to your family history. We also advised on lifestyle modifications such as exercise and portion control to reduce your risk of diabetes. Please use MyChart to view your lab results and for easy communication with  us .  INSTRUCTIONS:  Please schedule a follow-up appointment with an OB GYN for fibroid management and a dermatology referral for your hormonal skin issues. Continue to follow up with your infectious disease specialist and schedule your annual visit for screenings and management.

## 2023-10-24 NOTE — Progress Notes (Signed)
 Subjective:  Patient ID: Maria Nicholson, female    DOB: 08/19/1985  Age: 38 y.o. MRN: [id number]  CC: New Patient (Initial Visit) (Heavy bleeding/Skin irritation)     Discussed the use of AI scribe software for clinical note transcription with the patient, who gave verbal consent to proceed.  History of Present Illness Maria Nicholson is a 38 year old female with striae of asthma, eczema, HIV uterine fibroids and iron deficiency anemia who presents with concerns about fibroids and anemia. She is accompanied by her mother.  She experiences severe uterine fibroids causing significant iron deficiency anemia. Her iron levels are critically low, and she suffers from heavy menstrual bleeding, resulting in lightheadedness and fatigue, particularly during the first three days of her period. Despite taking over-the-counter ferrous sulfate  daily, her hemoglobin remains low at 8.5 from 1 month ago. She is frustrated with previous medical advice focused on fertility preservation rather than addressing her symptoms.  Asthma is stable and is managed with Zyrtec  which she takes for allergies, without the need for inhalers. Eczema is stable.She is concerned about skin changes and breakouts, which she attributes to hormonal changes since her fibroid diagnosis, and seeks a dermatology referral as topical regimens have been ineffective and she has found her face to be sensitive and so she discontinued them.  She has HIV and follows up with an infectious disease specialist and remains on her antiretroviral regimen.    Past Medical History:  Diagnosis Date   Asthma    Bronchitis    HIV (human immunodeficiency virus infection) (HCC)    No pertinent past medical history     Past Surgical History:  Procedure Laterality Date   DILATION AND CURETTAGE, DIAGNOSTIC / THERAPEUTIC      Family History  Problem Relation Age of Onset   Cancer Maternal Grandmother        throat   Cancer Father 35       spinal     Cancer Maternal Aunt 54       throat    Social History   Socioeconomic History   Marital status: Single    Spouse name: Not on file   Number of children: Not on file   Years of education: Not on file   Highest education level: Not on file  Occupational History   Not on file  Tobacco Use   Smoking status: Former    Current packs/day: 0.15    Average packs/day: 0.2 packs/day for 2.0 years (0.3 ttl pk-yrs)    Types: Cigarettes   Smokeless tobacco: Never   Tobacco comments:    decreased from one pack daily     Vaping Use   Vaping status: Never Used  Substance and Sexual Activity   Alcohol use: Yes    Comment: Occasionally    Drug use: Not Currently    Types: Marijuana   Sexual activity: Yes    Partners: Male    Birth control/protection: None, Condom  Other Topics Concern   Not on file  Social History Narrative   ** Merged History Encounter **       Social Drivers of Corporate investment banker Strain: Not on file  Food Insecurity: Not on file  Transportation Needs: Not on file  Physical Activity: Not on file  Stress: Not on file  Social Connections: Not on file    Allergies  Allergen Reactions   Amoxicillin Hives    Did it involve swelling of the face/tongue/throat, SOB, or low  BP? No Did it involve sudden or severe rash/hives, skin peeling, or any reaction on the inside of your mouth or nose? Yes Did you need to seek medical attention at a hospital or doctor's office? Yes When did it last happen?      38 yrs old If all above answers are "NO", may proceed with cephalosporin use.    Coconut Flavoring Agent (Non-Screening) Swelling    Outpatient Medications Prior to Visit  Medication Sig Dispense Refill   cetirizine  (ZYRTEC  ALLERGY) 10 MG tablet Take 1 tablet (10 mg total) by mouth daily as needed for allergies or rhinitis. 15 tablet 0   dolutegravir-lamiVUDine  (DOVATO ) 50-300 MG tablet Take 1 tablet by mouth daily. 30 tablet 6   ferrous sulfate  325 (65  FE) MG tablet Take 1 tablet (325 mg total) by mouth daily. 30 tablet 0   ibuprofen  (ADVIL ) 600 MG tablet Take 1 tablet (600 mg total) by mouth every 6 (six) hours as needed. 30 tablet 0   No facility-administered medications prior to visit.     ROS Review of Systems  Constitutional:  Positive for fatigue. Negative for activity change and appetite change.  HENT:  Negative for sinus pressure and sore throat.   Respiratory:  Negative for chest tightness, shortness of breath and wheezing.   Cardiovascular:  Negative for chest pain and palpitations.  Gastrointestinal:  Negative for abdominal distention, abdominal pain and constipation.  Genitourinary: Negative.   Musculoskeletal: Negative.   Skin:  Positive for rash.  Psychiatric/Behavioral:  Negative for behavioral problems and dysphoric mood.     Objective:  BP 130/83   Pulse 85   Ht 5\' 7"  (1.702 m)   Wt 291 lb (132 kg)   SpO2 99%   BMI 45.58 kg/m      10/24/2023    9:02 AM 09/19/2023    3:43 PM 02/28/2023    3:35 PM  BP/Weight  Systolic BP 130 130 117  Diastolic BP 83 84 78  Wt. (Lbs) 291 293 279  BMI 45.58 kg/m2 45.89 kg/m2 43.7 kg/m2      Physical Exam Constitutional:      Appearance: She is well-developed. She is obese.  Cardiovascular:     Rate and Rhythm: Normal rate.     Heart sounds: Normal heart sounds. No murmur heard. Pulmonary:     Effort: Pulmonary effort is normal.     Breath sounds: Normal breath sounds. No wheezing or rales.  Chest:     Chest wall: No tenderness.  Abdominal:     General: Bowel sounds are normal. There is no distension.     Palpations: Abdomen is soft. There is no mass.     Tenderness: There is no abdominal tenderness.  Musculoskeletal:        General: Normal range of motion.     Right lower leg: No edema.     Left lower leg: No edema.  Skin:    Comments: Pitting in skin of face with postinflammatory scarring  Neurological:     Mental Status: She is alert and oriented to person,  place, and time.  Psychiatric:        Mood and Affect: Mood normal.        Latest Ref Rng & Units 09/19/2023    4:03 AM 02/28/2023    3:54 PM 11/21/2022    3:26 AM  CMP  Glucose 65 - 99 mg/dL 90  99  83   BUN 7 - 25 mg/dL 14  13  14  Creatinine 0.50 - 0.97 mg/dL 1.61  0.96  0.45   Sodium 135 - 146 mmol/L 138  139  136   Potassium 3.5 - 5.3 mmol/L 4.3  4.3  4.2   Chloride 98 - 110 mmol/L 105  104  104   CO2 20 - 32 mmol/L 25  28  22    Calcium  8.6 - 10.2 mg/dL 9.4  9.6  9.2   Total Protein 6.1 - 8.1 g/dL 7.9  7.5  7.5   Total Bilirubin 0.2 - 1.2 mg/dL 0.2  0.2  0.3   AST 10 - 30 U/L 13  13  14    ALT 6 - 29 U/L 11  10  11      Lipid Panel     Component Value Date/Time   CHOL 158 10/03/2022 1508   TRIG 91 10/03/2022 1508   HDL 59 10/03/2022 1508   CHOLHDL 2.7 10/03/2022 1508   VLDL 12 11/15/2011 1223   LDLCALC 82 10/03/2022 1508    CBC    Component Value Date/Time   WBC 7.3 09/19/2023 0403   RBC 3.60 (L) 09/19/2023 0403   HGB 8.5 (L) 09/19/2023 0403   HCT 27.8 (L) 09/19/2023 0403   PLT 513 (H) 09/19/2023 0403   MCV 77.2 (L) 09/19/2023 0403   MCH 23.6 (L) 09/19/2023 0403   MCHC 30.6 (L) 09/19/2023 0403   RDW 15.8 (H) 09/19/2023 0403   LYMPHSABS 1,924 02/28/2023 1554   MONOABS 1.1 (H) 10/07/2018 0050   EOSABS 343 09/19/2023 0403   BASOSABS 102 09/19/2023 0403    No results found for: "HGBA1C"     Assessment & Plan Iron deficiency anemia Chronic iron deficiency anemia likely due to uterine fibroids. Current iron supplementation insufficient. Prescribed iron supplement twice daily. - Prescribe iron supplement twice daily. - Advise continuation of over-the-counter iron until prescription is filled.  Screening for metabolic disorder  Labs ordered for cholesterol and diabetes screening due to family history. Advised on lifestyle modifications to reduce diabetes risk. - Order labs for cholesterol and diabetes screening. - Advise on exercise and portion  control.   Uterine fibroids Symptomatic uterine fibroids causing severe bleeding and contributing to anemia. Current management inadequate. Referral to [facility] for fertility-conserving treatment options discussed. - Refer to [facility]YN for evaluation and fertility-conserving treatment options.  Asthma Asthma well-controlled with current medication. Symptoms triggered by indoor allergens managed with Zyrtec .  Eczema Eczema common with asthma. No current exacerbation.   Acne - Referred to dermatology as she has tried OTC products which have been ineffective especially with sensitivity of her facial skin   HIV infection HIV managed by infectious disease specialist.  - Continue antiretroviral therapy  -No acute issues. Continue follow-up and schedule annual visit. - Continue follow-up with infectious disease specialist. - Schedule annual visit for screenings and management.  Follow-up Scheduled follow-up with OB GYN for fibroid management and dermatology referral for hormonal skin issues. - Schedule follow-up with OB GYN for fibroid management. - Schedule dermatology referral for hormonal skin issues.      No orders of the defined types were placed in this encounter.   Follow-up: No follow-ups on file.       Joaquin Mulberry, MD, FAAFP. [facility] [place], [place] [phone]   10/24/2023, 9:07 AM

## 2023-10-25 ENCOUNTER — Encounter: Payer: Self-pay | Admitting: Family Medicine

## 2023-10-25 LAB — LP+NON-HDL CHOLESTEROL
Cholesterol, Total: 151 mg/dL (ref 100–199)
HDL: 50 mg/dL (ref >39)
LDL Chol Calc (NIH): 78 mg/dL (ref 0–99)
Total Non-HDL-Chol (LDL+VLDL): 101 mg/dL (ref 0–129)
Triglycerides: 133 mg/dL (ref 0–149)
VLDL Cholesterol Cal: 23 mg/dL (ref 5–40)

## 2023-10-25 LAB — HEMOGLOBIN A1C
Est. average glucose Bld gHb Est-mCnc: 103 mg/dL
Hgb A1c MFr Bld: 5.2 % (ref 4.8–5.6)

## 2023-11-07 ENCOUNTER — Other Ambulatory Visit: Payer: Self-pay

## 2023-11-07 ENCOUNTER — Ambulatory Visit

## 2024-02-08 ENCOUNTER — Telehealth: Payer: Self-pay | Admitting: Family Medicine

## 2024-02-08 NOTE — Telephone Encounter (Signed)
 Called patient about referral. Was not able to leave VM due to VM being full.

## 2024-03-27 ENCOUNTER — Ambulatory Visit: Admitting: Internal Medicine

## 2024-04-03 ENCOUNTER — Encounter: Payer: Self-pay | Admitting: Obstetrics and Gynecology

## 2024-04-03 ENCOUNTER — Ambulatory Visit: Admitting: Obstetrics and Gynecology

## 2024-04-03 ENCOUNTER — Other Ambulatory Visit: Payer: Self-pay

## 2024-04-03 VITALS — BP 116/79 | HR 86 | Wt 295.0 lb

## 2024-04-03 DIAGNOSIS — D219 Benign neoplasm of connective and other soft tissue, unspecified: Secondary | ICD-10-CM | POA: Diagnosis not present

## 2024-04-03 DIAGNOSIS — Z1331 Encounter for screening for depression: Secondary | ICD-10-CM | POA: Diagnosis not present

## 2024-04-03 DIAGNOSIS — R102 Pelvic and perineal pain unspecified side: Secondary | ICD-10-CM

## 2024-04-03 DIAGNOSIS — G8929 Other chronic pain: Secondary | ICD-10-CM

## 2024-04-03 NOTE — Progress Notes (Signed)
 GYNECOLOGY VISIT  Patient name: Maria Nicholson MRN [id number]  Date of birth: Nov 12, 1985 Chief Complaint:   Fibroids  History:  Discussed the use of AI scribe software for clinical note transcription with the patient, who gave verbal consent to proceed.  History of Present Illness Maria Nicholson is a 38 year old female with uterine fibroids who presents with abdominal pain and pressure. Has had worsening lower abdominal pain over 4 years.   She has a history of uterine fibroids, first diagnosed at age 69 following a miscarriage. She experiences a sensation of pressure and discomfort in her abdomen, particularly exacerbated during physical activities such as lifting weights. The pain is described as sharp and localized in the abdomen, limiting her ability to engage in strenuous exercise, although she is able to walk without significant discomfort. She has taken ibuprofen , which has helped.   She denies any changes in her menstrual periods but notes that the sensation of pressure sometimes feels like 'a lot of pressure' in the vaginal area, although nothing is visibly protruding. She has not undergone any surgeries or treatments specifically for fibroids, but she is currently taking iron supplements twice daily because of her fibroids and history of heavy menstrual bleeding. The bleeding has improved with iron supplementation, but the pain has worsened over time. She is not currently sexually active.   Her family history is significant for fibroids, as both her mother and sister have had them. Her sister underwent a partial hysterectomy after having children, while her mother's symptoms resolved with menopause. She wants to have one child in the future, which influences her considerations for treatment options.     The following portions of the patient's history were reviewed and updated as appropriate: allergies, current medications, past family history, past medical history, past social  history, past surgical history and problem list.   Health Maintenance:   Last pap     Component Value Date/Time   DIAGPAP  02/15/2023 1055    - Negative for intraepithelial lesion or malignancy (NILM)   DIAGPAP  10/17/2018 0000    NEGATIVE FOR INTRAEPITHELIAL LESIONS OR MALIGNANCY.   HPVHIGH Negative 02/15/2023 1055   ADEQPAP  02/15/2023 1055    Satisfactory for evaluation; transformation zone component ABSENT.   ADEQPAP  10/17/2018 0000    Satisfactory for evaluation  endocervical/transformation zone component ABSENT.    Health Maintenance  Topic Date Due   COVID-19 Vaccine (1) Never done   HPV Vaccine (1 - Risk 3-dose SCDM series) Never done   Flu Shot  Never done   Pap Smear  02/15/2024   Pneumococcal Vaccine (3 of 3 - PCV20 or PCV21) 10/23/2024*   Hepatitis B Vaccine  Completed   Hepatitis C Screening  Completed   HIV Screening  Completed   Meningitis B Vaccine  Aged Out   DTaP/Tdap/Td vaccine  Discontinued  *Topic was postponed. The date shown is not the original due date.    Review of Systems:  Pertinent items are noted in HPI. Comprehensive review of systems was otherwise negative.   Objective:  Physical Exam BP 116/79   Pulse 86   Wt 295 lb (133.8 kg)   LMP 03/29/2024   BMI 46.20 kg/m    Physical Exam Vitals and nursing note reviewed.  Constitutional:      Appearance: Normal appearance.  HENT:     Head: Normocephalic and atraumatic.  Pulmonary:     Effort: Pulmonary effort is normal.  Abdominal:  Comments: Enlarged uterus up t o 3cm below the umbilicus midelin, laterally extends nearly to ASIS bilaterally and nearly to inferior costal margin on the left side with limited mobility   Skin:    General: Skin is warm and dry.  Neurological:     General: No focal deficit present.     Mental Status: She is alert.  Psychiatric:        Mood and Affect: Mood normal.        Behavior: Behavior normal.        Thought Content: Thought content normal.         Judgment: Judgment normal.        Assessment & Plan:   Assessment & Plan Uterine fibroids (leiomyomas) with pelvic pain, menorrhagia, and iron deficiency anemia Chronic uterine fibroids causing pelvic pain, menorrhagia, and iron deficiency anemia. Discussed non-surgical and surgical management, including tranexamic acid and surgical procedures. Emphasized ultrasound for fibroid assessment. Discussed myomectomy risks and benefits, and hysterectomy as a last resort. Uterine artery embolization not recommended if pregnancy is desired. Sonata procedure and myomectomy are potential options. - Order pelvic ultrasound to assess fibroid size and location. - Consider tranexamic acid for menorrhagia if no contraindications. - Discuss surgical options post-ultrasound, including myomectomy and its implications for future pregnancy. - noted that based on examination, most likely would require open approach to complete procedure given size - will start with pelvic US  for baseline imaging, likely followed by pelvic MRI to better elucidate just how many fibroids are present (few large fibroids vs several small fibroids) for surgical planning  - Schedule follow-up appointment to review ultrasound results and discuss treatment plan.   Carter Quarry, MD [facility], [facility]

## 2024-04-10 ENCOUNTER — Encounter: Payer: Self-pay | Admitting: Internal Medicine

## 2024-04-10 ENCOUNTER — Ambulatory Visit (INDEPENDENT_AMBULATORY_CARE_PROVIDER_SITE_OTHER): Admitting: Internal Medicine

## 2024-04-10 ENCOUNTER — Ambulatory Visit (HOSPITAL_COMMUNITY)
Admission: RE | Admit: 2024-04-10 | Discharge: 2024-04-10 | Disposition: A | Discharge status: Home / Self Care | Source: Ambulatory Visit | Attending: Obstetrics and Gynecology | Admitting: Obstetrics and Gynecology

## 2024-04-10 ENCOUNTER — Other Ambulatory Visit (HOSPITAL_COMMUNITY)
Admission: RE | Admit: 2024-04-10 | Discharge: 2024-04-10 | Disposition: A | Discharge status: Home / Self Care | Source: Ambulatory Visit | Attending: Internal Medicine | Admitting: Internal Medicine

## 2024-04-10 ENCOUNTER — Other Ambulatory Visit: Payer: Self-pay

## 2024-04-10 VITALS — BP 124/82 | HR 82 | Temp 98.4°F | Ht 67.0 in | Wt 300.0 lb

## 2024-04-10 DIAGNOSIS — Z113 Encounter for screening for infections with a predominantly sexual mode of transmission: Secondary | ICD-10-CM

## 2024-04-10 DIAGNOSIS — B20 Human immunodeficiency virus [HIV] disease: Secondary | ICD-10-CM | POA: Diagnosis present

## 2024-04-10 DIAGNOSIS — G8929 Other chronic pain: Secondary | ICD-10-CM | POA: Insufficient documentation

## 2024-04-10 DIAGNOSIS — R102 Pelvic and perineal pain unspecified side: Secondary | ICD-10-CM | POA: Insufficient documentation

## 2024-04-10 DIAGNOSIS — D219 Benign neoplasm of connective and other soft tissue, unspecified: Secondary | ICD-10-CM | POA: Insufficient documentation

## 2024-04-10 MED ORDER — DOVATO 50-300 MG PO TABS: 1.0000 | ORAL_TABLET | Freq: Every day | ORAL | 6 refills | Status: AC

## 2024-04-10 NOTE — Progress Notes (Signed)
 [facility]     HPI: Maria Nicholson is a 38 y.o. female with HIV on dovato . Diagnosed with HIV in 2013. Noted she was initially started on a regimen involving Truvada+ unk drug and had muscle aches then switched to regimen above.  HIV aquired through heterosexual contact.  02/28/23: missed 3 doses of dovato  this month as did not have refillr, . Pt is sexaully active with parten,  sometimes uses condoms. 09/19/23: PT missed  2 weeks total since visit due to meds not being up. Sexually active  with boyfreind, use condoms. Pt currently not working. Social drinker. Today : last week missed a couple doses. Seeing ob for fibroids. Sexaully active with one partner Past Medical History:  Diagnosis Date   Asthma    Bronchitis    HIV (human immunodeficiency virus infection) (HCC)    No pertinent past medical history     Past Surgical History:  Procedure Laterality Date   DILATION AND CURETTAGE, DIAGNOSTIC / THERAPEUTIC      Family History  Problem Relation Age of Onset   Cancer Maternal Grandmother        throat   Cancer Father 41       spinal    Cancer Maternal Aunt 62       throat   Current Outpatient Medications on File Prior to Visit  Medication Sig Dispense Refill   cetirizine  (ZYRTEC  ALLERGY) 10 MG tablet Take 1 tablet (10 mg total) by mouth daily as needed for allergies or rhinitis. 15 tablet 0   dolutegravir-lamiVUDine  (DOVATO ) 50-300 MG tablet Take 1 tablet by mouth daily. 30 tablet 6   ferrous sulfate  325 (65 FE) MG tablet Take 1 tablet (325 mg total) by mouth 2 (two) times daily with a meal. 180 tablet 1   ibuprofen  (ADVIL ) 600 MG tablet Take 1 tablet (600 mg total) by mouth every 6 (six) hours as needed. 30 tablet 0   No current facility-administered medications on file prior to visit.    Allergies  Allergen Reactions   Amoxicillin Hives    Did it involve swelling of the face/tongue/throat, SOB, or low BP? No Did it involve sudden or  severe rash/hives, skin peeling, or any reaction on the inside of your mouth or nose? Yes Did you need to seek medical attention at a hospital or doctor's office? Yes When did it last happen?      38 yrs old If all above answers are "NO", may proceed with cephalosporin use.    Coconut Flavoring Agent (Non-Screening) Swelling      Lab Results HIV 1 RNA Quant  Date Value  09/19/2023 NOT DETECTED copies/mL  02/28/2023 <20 Copies/mL (H)  11/21/2022 <20 Copies/mL (H)   CD4 T Cell Abs (/uL)  Date Value  09/19/2023 674  02/28/2023 724  11/21/2022 764   No results found for: HIV1GENOSEQ Lab Results  Component Value Date   WBC 7.3 09/19/2023   HGB 8.5 (L) 09/19/2023   HCT 27.8 (L) 09/19/2023   MCV 77.2 (L) 09/19/2023   PLT 513 (H) 09/19/2023    Lab Results  Component Value Date   CREATININE 1.21 (H) 09/19/2023   BUN 14 09/19/2023   NA 138 09/19/2023   K 4.3 09/19/2023   CL 105 09/19/2023   CO2 25 09/19/2023   Lab Results  Component Value Date   ALT 11 09/19/2023   AST 13 09/19/2023   ALKPHOS 60 11/12/2018   BILITOT 0.2  09/19/2023    Lab Results  Component Value Date   CHOL 151 10/24/2023   TRIG 133 10/24/2023   HDL 50 10/24/2023   LDLCALC 78 10/24/2023   Lab Results  Component Value Date   HAV REACTIVE (A) 10/03/2022   Lab Results  Component Value Date   HEPBSAG NON-REACTIVE 10/03/2022   HEPBSAB NON-REACTIVE 10/03/2022   Lab Results  Component Value Date   HCVAB NEGATIVE 11/15/2011   Lab Results  Component Value Date   CHLAMYDIAWP Negative 12/28/2022   N Negative 12/28/2022   No results found for: GCPROBEAPT No results found for: QUANTGOLD  Assessment/Plan #HIV -CD4 674, VL nd, on 09/19/23 -On dovato  Plan -labs today -Follow up in 6 months   #Vaccination COVID not UTD Flu declined.  PCV 13 5/022019 PCV 23 11/15/2011 PCV -20 declined Meningitis today 1st dose 10/03/22, 2nd dose today HepA NR 2013, immune 10/03/22 HEpB SAB NR  10/03/22.1st odse 11/21/22, 2nd dose 11/21/22 Tdap not UTD   #Health maintenance -Quantiferon negative 09/19/23 -RPR negative  09/19/23 -HCV negative  09/19/23 -GC today -Lipid The ASCVD Risk score (Arnett DK, et al., 2019) failed to calculate for the following reasons:   The 2019 ASCVD risk score is only valid for ages 59 to 26  -Dysplasia screen F-> 2025 with ob -Mammogram     Loney Stank, MD [facility] I have personally spent 35 minutes involved in face-to-face and non-face-to-face activities for this patient on the day of the visit. Professional time spent includes the following activities: Preparing to see the patient (review of tests), Obtaining and/or reviewing separately obtained history (admission/discharge record), Performing a medically appropriate examination and/or evaluation , Ordering medications/tests/procedures, referring and communicating with other health care professionals, Documenting clinical information in the EMR, Independently interpreting results (not separately reported), Communicating results to the patient/family/caregiver, Counseling and educating the patient/family/caregiver and Care coordination (not separately reported).

## 2024-04-11 LAB — CYTOLOGY, (ORAL, ANAL, URETHRAL) ANCILLARY ONLY
Chlamydia: NEGATIVE
Comment: NEGATIVE
Comment: NORMAL
Neisseria Gonorrhea: NEGATIVE

## 2024-04-11 LAB — CERVICOVAGINAL ANCILLARY ONLY
Bacterial Vaginitis (gardnerella): POSITIVE — AB
Candida Glabrata: NEGATIVE
Candida Vaginitis: NEGATIVE
Chlamydia: NEGATIVE
Comment: NEGATIVE
Comment: NEGATIVE
Comment: NEGATIVE
Comment: NEGATIVE
Comment: NEGATIVE
Comment: NORMAL
Neisseria Gonorrhea: NEGATIVE
Trichomonas: NEGATIVE

## 2024-04-11 LAB — T-HELPER CELLS (CD4) COUNT (NOT AT ARMC)
CD4 % Helper T Cell: 37 % (ref 33–65)
CD4 T Cell Abs: 622 /uL (ref 400–1790)

## 2024-04-12 ENCOUNTER — Other Ambulatory Visit: Payer: Self-pay | Admitting: Internal Medicine

## 2024-04-12 ENCOUNTER — Telehealth: Payer: Self-pay

## 2024-04-12 LAB — COMPLETE METABOLIC PANEL WITHOUT GFR
AG Ratio: 1.2 (calc) (ref 1.0–2.5)
ALT: 12 U/L (ref 6–29)
AST: 15 U/L (ref 10–30)
Albumin: 4.2 g/dL (ref 3.6–5.1)
Alkaline phosphatase (APISO): 72 U/L (ref 31–125)
BUN/Creatinine Ratio: 11 (calc) (ref 6–22)
BUN: 11 mg/dL (ref 7–25)
CO2: 25 mmol/L (ref 20–32)
Calcium: 9.6 mg/dL (ref 8.6–10.2)
Chloride: 101 mmol/L (ref 98–110)
Creat: 1.04 mg/dL — ABNORMAL HIGH (ref 0.50–0.97)
Globulin: 3.4 g/dL (ref 1.9–3.7)
Glucose, Bld: 88 mg/dL (ref 65–99)
Potassium: 4.4 mmol/L (ref 3.5–5.3)
Sodium: 135 mmol/L (ref 135–146)
Total Bilirubin: 0.3 mg/dL (ref 0.2–1.2)
Total Protein: 7.6 g/dL (ref 6.1–8.1)

## 2024-04-12 LAB — RPR: RPR Ser Ql: NONREACTIVE

## 2024-04-12 LAB — CBC WITH DIFFERENTIAL/PLATELET
Absolute Lymphocytes: 1522 {cells}/uL (ref 850–3900)
Absolute Monocytes: 604 {cells}/uL (ref 200–950)
Basophils Absolute: 91 {cells}/uL (ref 0–200)
Basophils Relative: 1.6 %
Eosinophils Absolute: 160 {cells}/uL (ref 15–500)
Eosinophils Relative: 2.8 %
HCT: 35.7 % (ref 35.0–45.0)
Hemoglobin: 11.2 g/dL — ABNORMAL LOW (ref 11.7–15.5)
MCH: 27 pg (ref 27.0–33.0)
MCHC: 31.4 g/dL — ABNORMAL LOW (ref 32.0–36.0)
MCV: 86 fL (ref 80.0–100.0)
MPV: 10.5 fL (ref 7.5–12.5)
Monocytes Relative: 10.6 %
Neutro Abs: 3323 {cells}/uL (ref 1500–7800)
Neutrophils Relative %: 58.3 %
Platelets: 481 Thousand/uL — ABNORMAL HIGH (ref 140–400)
RBC: 4.15 Million/uL (ref 3.80–5.10)
RDW: 14.8 % (ref 11.0–15.0)
Total Lymphocyte: 26.7 %
WBC: 5.7 Thousand/uL (ref 3.8–10.8)

## 2024-04-12 LAB — HIV-1 RNA QUANT-NO REFLEX-BLD
HIV 1 RNA Quant: NOT DETECTED {copies}/mL
HIV-1 RNA Quant, Log: NOT DETECTED {Log_copies}/mL

## 2024-04-12 MED ORDER — METRONIDAZOLE 500 MG PO TABS: 500.0000 mg | ORAL_TABLET | Freq: Two times a day (BID) | ORAL | 0 refills | Status: DC

## 2024-04-12 NOTE — Telephone Encounter (Signed)
 Patient advised of positive BV results. Advised her Dr. Dennise has sent in flagyl  for her and no alcohol while taking the medication.  Patient verbalized understanding.  Maria Nicholson ONEIDA Ligas, CMA

## 2024-04-12 NOTE — Progress Notes (Signed)
Metro for bv

## 2024-04-16 ENCOUNTER — Ambulatory Visit: Payer: Self-pay | Admitting: Obstetrics and Gynecology

## 2024-04-16 DIAGNOSIS — D219 Benign neoplasm of connective and other soft tissue, unspecified: Secondary | ICD-10-CM

## 2024-04-16 DIAGNOSIS — G8929 Other chronic pain: Secondary | ICD-10-CM

## 2024-04-17 ENCOUNTER — Telehealth: Payer: Self-pay

## 2024-04-17 NOTE — Telephone Encounter (Signed)
 Patient called stating that she can't continue taking Metronidazole  due to it causing nausea and vomiting. Is there an alternative medication she could take?   Maria Nicholson [name] Letters, CMA

## 2024-04-18 ENCOUNTER — Telehealth: Payer: Self-pay | Admitting: Internal Medicine

## 2024-04-18 DIAGNOSIS — B9689 Other specified bacterial agents as the cause of diseases classified elsewhere: Secondary | ICD-10-CM

## 2024-04-18 MED ORDER — METRONIDAZOLE 0.75 % VA GEL: 1.0000 | Freq: Every day | VAGINAL | 0 refills | Status: AC

## 2024-04-18 NOTE — Telephone Encounter (Signed)
 Spoke with pt who states she would prefer to switch Metro Gel. Has been on this and the past and denies issues tolerating it. Would like script sent to [facility].  Lorenda CHRISTELLA Code, RMA

## 2024-04-18 NOTE — Telephone Encounter (Signed)
 Sent! Thank you Cece

## 2024-04-18 NOTE — Telephone Encounter (Signed)
 She can always try metro gel or clinda cream vaginally instead if she would prefer that.

## 2024-04-18 NOTE — Telephone Encounter (Signed)
 Maria Nicholson called in hopes of an update from Dr. Dennise regarding 10/29 telephone note conversation. I informed Maria Nicholson the message has been sent to Dr. Dennise and we are waiting for a response. Pt was understanding and can be reached at [phone].

## 2024-04-18 NOTE — Addendum Note (Signed)
 Addended by: WADDELL ALAN PARAS on: 04/18/2024 10:25 AM   Modules accepted: Orders

## 2024-04-30 ENCOUNTER — Other Ambulatory Visit (HOSPITAL_COMMUNITY)
Admission: RE | Admit: 2024-04-30 | Discharge: 2024-04-30 | Disposition: A | Discharge status: Home / Self Care | Source: Ambulatory Visit | Attending: Internal Medicine | Admitting: Internal Medicine

## 2024-04-30 ENCOUNTER — Other Ambulatory Visit: Payer: Self-pay

## 2024-04-30 ENCOUNTER — Ambulatory Visit: Admitting: Pharmacist

## 2024-04-30 DIAGNOSIS — Z113 Encounter for screening for infections with a predominantly sexual mode of transmission: Secondary | ICD-10-CM | POA: Insufficient documentation

## 2024-04-30 DIAGNOSIS — Z0000 Encounter for general adult medical examination without abnormal findings: Secondary | ICD-10-CM | POA: Diagnosis not present

## 2024-04-30 NOTE — Progress Notes (Signed)
   HPI: Maria Nicholson is a 38 y.o. female who presents to the [facility] clinic today for STI testing.  Referring ID Provider: Dr. Dennise   Patient Active Problem List   Diagnosis Date Noted   Asthma 10/24/2023   Absolute anemia 10/24/2023   Tongue lesion 09/10/2019   Vaginal itching 01/29/2019   Healthcare maintenance 11/27/2018   Fibroids 11/27/2018   Human immunodeficiency virus (HIV) disease (HCC) 11/06/2017   Eczema 11/17/2011    Patient's Medications  New Prescriptions   No medications on file  Previous Medications   CETIRIZINE  (ZYRTEC  ALLERGY) 10 MG TABLET    Take 1 tablet (10 mg total) by mouth daily as needed for allergies or rhinitis.   DOLUTEGRAVIR-LAMIVUDINE  (DOVATO ) 50-300 MG TABLET    Take 1 tablet by mouth daily.   FERROUS SULFATE  325 (65 FE) MG TABLET    Take 1 tablet (325 mg total) by mouth 2 (two) times daily with a meal.   IBUPROFEN  (ADVIL ) 600 MG TABLET    Take 1 tablet (600 mg total) by mouth every 6 (six) hours as needed.   METRONIDAZOLE  (METROGEL ) 0.75 % VAGINAL GEL    Place 1 Applicatorful vaginally at bedtime. Use for 5 days.  Modified Medications   No medications on file  Discontinued Medications   No medications on file    Assessment: [name] presents to clinic today for STI testing. She recently saw Dr. Dennise in clinic on 04/10/2024 for HIV follow up and is currently on Dovato . During this visit, patient tested positive for bacterial vaginosis and was initially prescribed oral metronidazole , which was then switched to Metro Gel due to nausea/vomiting with orals. She reports having 1 more dose of the MetroGel  left and was concerned about continuing due to her menstrual cycle which has started. Informed her that it would be safe to use the last dose of the MetroGel .   Patient's chief complaint today is concern for a bump surrounding genital area that appeared about 3 days ago. She describes the bump as pimple and boil appearing and was painful for 2 days but  has decreased in size and no longer painful as she noticed it bursted/popped. She reports having sexual intercourse 2 weeks ago with her same partner and was concerned this may be an STI. She also expressed concerns that this may be herpes. Discussed with patient that this does not appear to be herpes which normally does not pop/burst and usually appears as multiple, small blisters. Informed patient to please reach out if the bump worsens or does not resolve. She states that she would like to avoid urine cytology and vaginal swab for STI testing due to menstrual cycle, however, she requests oral cytology for GC/Chlamydia and RPR today.   Eligible vaccines: PCV20, influenza, covid, Tdap, Shingrix 1/2, HPV 1/3 - Patient politely declines vaccines today   Plan: - Oral cytology for GC/Chlamydia and RPR per patient request  - Will also obtain hepatitis B antibody to assess immunity status post vaccine series completion  - F/u results to see if treatment is needed  Feliciano Close, PharmD PGY2 Infectious Diseases Pharmacy Resident

## 2024-05-01 LAB — CYTOLOGY, (ORAL, ANAL, URETHRAL) ANCILLARY ONLY
Chlamydia: NEGATIVE
Comment: NEGATIVE
Comment: NORMAL
Neisseria Gonorrhea: NEGATIVE

## 2024-05-01 LAB — HEPATITIS B SURFACE ANTIBODY,QUALITATIVE: Hep B S Ab: REACTIVE — AB

## 2024-05-01 LAB — RPR: RPR Ser Ql: NONREACTIVE

## 2024-05-13 ENCOUNTER — Encounter: Payer: Self-pay | Admitting: Obstetrics and Gynecology

## 2024-05-14 ENCOUNTER — Ambulatory Visit: Payer: Self-pay

## 2024-05-14 ENCOUNTER — Emergency Department (HOSPITAL_BASED_OUTPATIENT_CLINIC_OR_DEPARTMENT_OTHER)
Admission: EM | Admit: 2024-05-14 | Discharge: 2024-05-14 | Disposition: A | Discharge status: Home / Self Care | Source: Ambulatory Visit | Attending: Emergency Medicine | Admitting: Emergency Medicine

## 2024-05-14 ENCOUNTER — Other Ambulatory Visit: Payer: Self-pay

## 2024-05-14 ENCOUNTER — Encounter (HOSPITAL_BASED_OUTPATIENT_CLINIC_OR_DEPARTMENT_OTHER): Payer: Self-pay | Admitting: Emergency Medicine

## 2024-05-14 ENCOUNTER — Ambulatory Visit: Attending: Internal Medicine | Admitting: Internal Medicine

## 2024-05-14 ENCOUNTER — Encounter: Payer: Self-pay | Admitting: Internal Medicine

## 2024-05-14 VITALS — BP 126/79 | HR 87 | Temp 97.8°F | Ht 67.0 in | Wt 293.0 lb

## 2024-05-14 DIAGNOSIS — L02411 Cutaneous abscess of right axilla: Secondary | ICD-10-CM

## 2024-05-14 DIAGNOSIS — Z79899 Other long term (current) drug therapy: Secondary | ICD-10-CM | POA: Diagnosis not present

## 2024-05-14 MED ORDER — SULFAMETHOXAZOLE-TRIMETHOPRIM 800-160 MG PO TABS: 1.0000 | ORAL_TABLET | Freq: Two times a day (BID) | ORAL | 0 refills | Status: AC

## 2024-05-14 MED ORDER — OXYCODONE-ACETAMINOPHEN 5-325 MG PO TABS
2.0000 | ORAL_TABLET | Freq: Once | ORAL | Status: AC
  Administered 2024-05-14: 2 via ORAL
  Filled 2024-05-14: qty 2

## 2024-05-14 MED ORDER — TRAMADOL HCL 50 MG PO TABS: 50.0000 mg | ORAL_TABLET | Freq: Three times a day (TID) | ORAL | 0 refills | Status: AC | PRN

## 2024-05-14 MED ORDER — LIDOCAINE-EPINEPHRINE (PF) 2 %-1:200000 IJ SOLN
10.0000 mL | Freq: Once | INTRAMUSCULAR | Status: DC
  Filled 2024-05-14: qty 20

## 2024-05-14 NOTE — Telephone Encounter (Signed)
 FYI Only or Action Required?: FYI only for provider: appointment scheduled on 05/14/24.  Patient was last seen in primary care on 10/24/2023 by Newlin, Enobong, MD.  Called Nurse Triage reporting Abscess.  Symptoms began several weeks ago.  Interventions attempted: Rest, hydration, or home remedies and Ice/heat application.  Symptoms are: gradually worsening.  Triage Disposition: See Physician Within 24 Hours  Patient/caregiver understands and will follow disposition?: Yes           Copied from CRM #[id number]. Topic: Clinical - Red Word Triage >> May 14, 2024  8:14 AM Maria Nicholson wrote: [facility] that prompted transfer to Nurse Triage: Patient is having the following symptoms:   - Boil under her right -- Causing her severe pain -- Swollen -- Patient stated there is stuff in the boil but it will not come out.   **Transf. To NT** Reason for Disposition  Boil > 2 inches across (> 5 cm; larger than a golf ball or ping pong ball)  Answer Assessment - Initial Assessment Questions 1. APPEARANCE of BOIL: What does the boil look like?      Lump - endorses appeared after shaving under arm 2. LOCATION: Where is the boil located?      R arm 3. NUMBER: How many boils are there?      1 4. SIZE: How big is the boil? (e.g., inches, cm; compare to size of a coin or other object)     Size of golf ball 5. ONSET: When did the boil start?     2 weeks 6. PAIN: Is there any pain? If Yes, ask: How bad is the pain?   (Scale 1-10; or mild, moderate, severe)     yes 7. FEVER: Do you have a fever? If Yes, ask: What is it, how was it measured, and when did it start?      yes 8. SOURCE: Have you been around anyone with boils or other Staph infections? Have you ever had boils before?     *No Answer* 9. OTHER SYMPTOMS: Do you have any other symptoms? (e.g., shaking chills, weakness, rash elsewhere on body)     Endorses smells bad 10. PREGNANCY: Is there any chance you  are pregnant? When was your last menstrual period?       N/a  Protocols used: Boil (Skin Abscess)-A-AH

## 2024-05-14 NOTE — Telephone Encounter (Signed)
 noted

## 2024-05-14 NOTE — ED Notes (Addendum)
 Pt left prior to receiving dc paperwork

## 2024-05-14 NOTE — Patient Instructions (Signed)
  VISIT SUMMARY: Today, you came in with a painful cyst under your right arm that has been bothering you for the past four days. You mentioned that you have had similar issues in the past, and you believe this one was caused by shaving too closely. You have been using antibacterial soap and a salve, but the cyst has not drained. You have also been taking ibuprofen  for pain, but it hasn't helped much. You are allergic to amoxicillin and metronidazole .  YOUR PLAN: -RIGHT AXILLARY ABSCESS: A right axillary abscess is a collection of pus that has built up within the tissue under your right arm. It is likely caused by an infection, possibly from shaving too closely. You will need to go to the emergency room to have the abscess drained. After the drainage, you will be prescribed antibiotics to prevent further infection. For pain management, you have been prescribed tramadol , but please be cautious as it can cause drowsiness and affect your ability to drive.  -MEDICATION ALLERGIES: AMOXICILLIN AND METRONIDAZOLE : You are allergic to the antibiotics amoxicillin and metronidazole , which means you should avoid these medications as they can make you sick. We will ensure that any antibiotics prescribed to you will not include these medications.  INSTRUCTIONS: Please go to the emergency room as soon as possible to have the abscess drained. After the procedure, follow the prescribed antibiotic regimen to prevent infection. Use tramadol  as directed for pain, but be cautious about drowsiness and avoid driving if you feel impaired.                      Contains text generated by [employer]bridge.                                 Contains text generated by Abridge.

## 2024-05-14 NOTE — Progress Notes (Signed)
 Patient ID: [name], female    DOB: March 17, 1986  MRN: [id number]  CC: Cyst (Painful cyst on R underarm X4 days - painful/)   Subjective: Maria Nicholson is a 38 y.o. female who presents UC visit. Her concerns today include:  38 year old female with hx of striae of asthma, eczema, HIV uterine fibroids and iron deficiency anemia    Discussed the use of AI scribe software for clinical note transcription with the patient, who gave verbal consent to proceed.  History of Present Illness Maria Nicholson is a 38 year old female who presents with a painful cyst under her right arm. She is accompanied by her mother.  She has had a painful cyst under her right arm for the past four days. The cyst has not drained despite using antibacterial soap and a salve called Pritt, which has drawn it to the surface. No fever or drainage is reported.  She has a history of similar issues, having had an ingrown boil in the same area before, which was previously lanced. She attributes the current cyst to shaving too closely about a week or two ago, after which she noticed the cyst beginning to surface.  For pain management, she has been taking ibuprofen , but it has not been effective. She is allergic to amoxicillin and metronidazole , which makes her sick.    Patient Active Problem List   Diagnosis Date Noted   Asthma 10/24/2023   Absolute anemia 10/24/2023   Tongue lesion 09/10/2019   Vaginal itching 01/29/2019   Healthcare maintenance 11/27/2018   Fibroids 11/27/2018   Human immunodeficiency virus (HIV) disease (HCC) 11/06/2017   Eczema 11/17/2011     Current Outpatient Medications on File Prior to Visit  Medication Sig Dispense Refill   cetirizine  (ZYRTEC  ALLERGY) 10 MG tablet Take 1 tablet (10 mg total) by mouth daily as needed for allergies or rhinitis. 15 tablet 0   dolutegravir-lamiVUDine  (DOVATO ) 50-300 MG tablet Take 1 tablet by mouth daily. 30 tablet 6   ferrous sulfate  325 (65 FE) MG  tablet Take 1 tablet (325 mg total) by mouth 2 (two) times daily with a meal. 180 tablet 1   ibuprofen  (ADVIL ) 600 MG tablet Take 1 tablet (600 mg total) by mouth every 6 (six) hours as needed. 30 tablet 0   metroNIDAZOLE  (METROGEL ) 0.75 % vaginal gel Place 1 Applicatorful vaginally at bedtime. Use for 5 days. 55 g 0   No current facility-administered medications on file prior to visit.    Allergies  Allergen Reactions   Amoxicillin Hives    Did it involve swelling of the face/tongue/throat, SOB, or low BP? No Did it involve sudden or severe rash/hives, skin peeling, or any reaction on the inside of your mouth or nose? Yes Did you need to seek medical attention at a hospital or doctor's office? Yes When did it last happen?      38 yrs old If all above answers are "NO", may proceed with cephalosporin use.    Coconut Flavoring Agent (Non-Screening) Swelling   Metronidazole  Other (See Comments)    Makes me sick    Social History   Socioeconomic History   Marital status: Single    Spouse name: Not on file   Number of children: Not on file   Years of education: Not on file   Highest education level: Not on file  Occupational History   Not on file  Tobacco Use   Smoking status: Former    Current  packs/day: 0.15    Average packs/day: 0.2 packs/day for 2.0 years (0.3 ttl pk-yrs)    Types: Cigarettes   Smokeless tobacco: Never   Tobacco comments:    decreased from one pack daily     Vaping Use   Vaping status: Never Used  Substance and Sexual Activity   Alcohol use: Yes    Comment: Occasionally    Drug use: Not Currently    Types: Marijuana   Sexual activity: Yes    Partners: Male    Birth control/protection: None, Condom    Comment: declined condoms  Other Topics Concern   Not on file  Social History Narrative   ** Merged History Encounter **       Social Drivers of Health   Financial Resource Strain: High Risk (10/24/2023)   Overall Financial Resource Strain (CARDIA)     Difficulty of Paying Living Expenses: Very hard  Food Insecurity: Food Insecurity Present (04/30/2024)   Hunger Vital Sign    Worried About Running Out of Food in the Last Year: Sometimes true    Ran Out of Food in the Last Year: Often true  Transportation Needs: No Transportation Needs (10/24/2023)   PRAPARE - Administrator, Civil Service (Medical): No    Lack of Transportation (Non-Medical): No  Physical Activity: Insufficiently Active (10/24/2023)   Exercise Vital Sign    Days of Exercise per Week: 1 day    Minutes of Exercise per Session: 60 min  Stress: No Stress Concern Present (10/24/2023)   [employer] of Occupational Health - Occupational Stress Questionnaire    Feeling of Stress : Not at all  Social Connections: Moderately Isolated (10/24/2023)   Social Connection and Isolation Panel    Frequency of Communication with Friends and Family: Once a week    Frequency of Social Gatherings with Friends and Family: Never    Attends Religious Services: 1 to 4 times per year    Active Member of [employer] or Organizations: No    Attends Engineer, Structural: 1 to 4 times per year    Marital Status: Divorced  Catering Manager Violence: Not At Risk (10/24/2023)   Humiliation, Afraid, Rape, and Kick questionnaire    Fear of Current or Ex-Partner: No    Emotionally Abused: No    Physically Abused: No    Sexually Abused: No    Family History  Problem Relation Age of Onset   Cancer Maternal Grandmother        throat   Cancer Father 45       spinal    Cancer Maternal Aunt 62       throat    Past Surgical History:  Procedure Laterality Date   DILATION AND CURETTAGE, DIAGNOSTIC / THERAPEUTIC      ROS: Review of Systems Negative except as stated above  PHYSICAL EXAM: BP 126/79 (BP Location: Left Arm, Patient Position: Sitting, Cuff Size: Large)   Pulse 87   Temp 97.8 F (36.6 C) (Oral)   Ht 5' 7 (1.702 m)   Wt 293 lb (132.9 kg)   SpO2 97%   BMI 45.89  kg/m   Physical Exam  General appearance - alert, well appearing, young AAF and in no distress Mental status - normal mood, behavior, speech, dress, motor activity, and thought processes Skin - large, raised abscess in RT axilla with fluctuance and very tender to touch. No drainage. See pic below       Latest Ref Rng & Units 04/10/2024  2:51 PM 09/19/2023    4:03 AM 02/28/2023    3:54 PM  CMP  Glucose 65 - 99 mg/dL 88  90  99   BUN 7 - 25 mg/dL 11  14  13    Creatinine 0.50 - 0.97 mg/dL 8.95  8.78  8.94   Sodium 135 - 146 mmol/L 135  138  139   Potassium 3.5 - 5.3 mmol/L 4.4  4.3  4.3   Chloride 98 - 110 mmol/L 101  105  104   CO2 20 - 32 mmol/L 25  25  28    Calcium  8.6 - 10.2 mg/dL 9.6  9.4  9.6   Total Protein 6.1 - 8.1 g/dL 7.6  7.9  7.5   Total Bilirubin 0.2 - 1.2 mg/dL 0.3  0.2  0.2   AST 10 - 30 U/L 15  13  13    ALT 6 - 29 U/L 12  11  10     Lipid Panel     Component Value Date/Time   CHOL 151 10/24/2023 0932   TRIG 133 10/24/2023 0932   HDL 50 10/24/2023 0932   CHOLHDL 2.7 10/03/2022 1508   VLDL 12 11/15/2011 1223   LDLCALC 78 10/24/2023 0932   LDLCALC 82 10/03/2022 1508    CBC    Component Value Date/Time   WBC 5.7 04/10/2024 1451   RBC 4.15 04/10/2024 1451   HGB 11.2 (L) 04/10/2024 1451   HCT 35.7 04/10/2024 1451   PLT 481 (H) 04/10/2024 1451   MCV 86.0 04/10/2024 1451   MCH 27.0 04/10/2024 1451   MCHC 31.4 (L) 04/10/2024 1451   RDW 14.8 04/10/2024 1451   LYMPHSABS 1,924 02/28/2023 1554   MONOABS 1.1 (H) 10/07/2018 0050   EOSABS 160 04/10/2024 1451   BASOSABS 91 04/10/2024 1451    ASSESSMENT AND PLAN: Assessment and Plan Assessment & Plan Right axillary abscess Large, deep abscess requiring drainage. - Referred to emergency room for drainage. - Prescribed antibiotics  ,Bactrim ,  to take post-drainage. - Prescribed tramadol  for pain management, advised caution with drowsiness and driving. NCCSRS reviewed.    Patient was given the opportunity  to ask questions.  Patient verbalized understanding of the plan and was able to repeat key elements of the plan.   This documentation was completed using Paediatric nurse.  Any transcriptional errors are unintentional.  No orders of the defined types were placed in this encounter.    Requested Prescriptions   Signed Prescriptions Disp Refills   sulfamethoxazole -trimethoprim  (BACTRIM  DS) 800-160 MG tablet 14 tablet 0    Sig: Take 1 tablet by mouth 2 (two) times daily.   traMADol  (ULTRAM ) 50 MG tablet 12 tablet 0    Sig: Take 1 tablet (50 mg total) by mouth every 8 (eight) hours as needed for up to 4 days.    Return if symptoms worsen or fail to improve.  Barnie Louder, MD, FACP

## 2024-05-14 NOTE — Discharge Instructions (Signed)
 You were seen in the emergency department today for management of your abscess.  We have drained the area and cleaned it. The area will likely continue to drain over the next 48 hours, you may have some bleeding as well. This is to be expected. I would like you to have the wound checked in 2-3 days. This can be done by any doctor's office, urgent care, or emergency department. This is to make sure the area hasn't closed too soon and is healing appropriately. Try to keep the area as clean and dry as possible. You can also continue to apply warm compresses to the area or soak in warm water for 15-20 minutes 3-4 times daily. This can help with wound healing and promote drainage.   You can also clean the area regularly with soap and water which can help ensure it heals appropriately from the inside out. If you can tolerate the discomfort, you can also use a Q-tip or washcloth to clean the area as well. Avoid cleaners such as peroxide and alcohol as these can damage wound tissue and slow healing.  Keep the area covered with a clean gauze dressing or bandage.  Change this dressing if it becomes wet or dirty or at least once daily.  You have been placed on a course of antibiotics. It is important you finish the entire course! You can take ibuprofen  or tylenol  as needed for pain.   After this area has healed fully and is no longer an open wound, you can use an antiseptic chlorhexidine (brand name Hibiclens) soap from the pharmacy 1-2 x per month in the areas where abscesses are most likely to form (armpits, buttocks, groin). This can help to manage the skin bacteria that causes these abscesses to form. This soap can dry your skin out so use it sparingly.  Additionally, given that you have had recurrent abscesses in this region, you may need to see a surgeon to have the area removed to prevent another recurrence.  I have given you a referral with a number to call to schedule appointment for follow-up.  Please call  at your earliest convenience.  Return if development of worsening swelling, pain, fevers, or any new or worsening symptoms.

## 2024-05-14 NOTE — ED Triage Notes (Signed)
 Reports abscess under R arm. X 4 days. Denies fever.

## 2024-05-14 NOTE — ED Provider Notes (Signed)
 [place] EMERGENCY DEPARTMENT AT [facility] Provider Note   CSN: [id number] Arrival date & time: 05/14/24  1214     Patient presents with: Abscess   Maria Nicholson is a 38 y.o. female.   Patient with history of asthma, HIV on antivirals presents today with complaints of right axillary abscess.  Reports this has been present for 4 days.  She has attempted warm compresses and soap and water without successful drainage.  Reports she has had several abscesses previously requiring incision and drainage.  Reports that she did shave her axilla a few weeks ago which she attributes to cause of this development.  She denies any fevers or chills.  [facility] and was prescribed Bactrim , however was told they could not drain this at this facility and was sent here for management of this.      The history is provided by the patient. No language interpreter was used.  Abscess      Prior to Admission medications   Medication Sig Start Date End Date Taking? Authorizing Provider  cetirizine  (ZYRTEC  ALLERGY) 10 MG tablet Take 1 tablet (10 mg total) by mouth daily as needed for allergies or rhinitis. 03/17/21   Petrucelli, Samantha R, PA-C  dolutegravir-lamiVUDine  (DOVATO ) 50-300 MG tablet Take 1 tablet by mouth daily. 04/10/24   Dennise Kingsley, MD  ferrous sulfate  325 (65 FE) MG tablet Take 1 tablet (325 mg total) by mouth 2 (two) times daily with a meal. 10/24/23   Delbert Clam, MD  ibuprofen  (ADVIL ) 600 MG tablet Take 1 tablet (600 mg total) by mouth every 6 (six) hours as needed. 05/25/20   Blaise Aleene KIDD, MD  metroNIDAZOLE  (METROGEL ) 0.75 % vaginal gel Place 1 Applicatorful vaginally at bedtime. Use for 5 days. 04/18/24   Dennise Kingsley, MD  sulfamethoxazole -trimethoprim  (BACTRIM  DS) 800-160 MG tablet Take 1 tablet by mouth 2 (two) times daily. 05/14/24   Vicci Barnie NOVAK, MD  traMADol  (ULTRAM ) 50 MG tablet Take 1 tablet (50 mg total)  by mouth every 8 (eight) hours as needed for up to 4 days. 05/14/24 05/18/24  Vicci Barnie NOVAK, MD    Allergies: Amoxicillin, Coconut flavoring agent (non-screening), and Metronidazole     Review of Systems  All other systems reviewed and are negative.   Updated Vital Signs BP 125/81 (BP Location: Left Arm)   Pulse 94   Temp 98.7 F (37.1 C)   Resp 16   SpO2 96%   Physical Exam Vitals and nursing note reviewed.  Constitutional:      General: [name]    Appearance: Normal appearance. She is normal weight. She is not ill-appearing, toxic-appearing or diaphoretic.  HENT:     Head: Normocephalic and atraumatic.  Cardiovascular:     Rate and Rhythm: Normal rate.  Pulmonary:     Effort: Pulmonary effort is normal. No respiratory distress.  Musculoskeletal:        General: Normal range of motion.     Cervical back: Normal range of motion.  Skin:    General: Skin is warm and dry.     Comments: 3 cm x 6 cm area of fluctuance with surrounding induration noted to the right axilla.  No signs of drainage.  Neurological:     General: No focal deficit present.     Mental Status: She is alert.  Psychiatric:        Mood and Affect: Mood normal.  Behavior: Behavior normal.     (all labs ordered are listed, but only abnormal results are displayed) Labs Reviewed - No data to display  EKG: None  Radiology: No results found.   .Incision and Drainage  Date/Time: 05/14/2024 4:12 PM  Performed by: Nora Lauraine LABOR, PA-C Authorized by: Muhannad Bignell A, PA-C   Consent:    Consent obtained:  Verbal   Consent given by:  Patient   Risks, benefits, and alternatives were discussed: yes     Risks discussed:  Bleeding, incomplete drainage, pain, infection and damage to other organs   Alternatives discussed:  No treatment, delayed treatment, alternative treatment, referral and observation Universal protocol:    Procedure explained and questions answered to  patient or proxy's satisfaction: yes     Patient identity confirmed:  Verbally with patient Location:    Type:  Abscess   Size:  3 cm x 6 cm   Location: right axilla. Pre-procedure details:    Skin preparation:  Povidone-iodine Sedation:    Sedation type:  Anxiolysis Anesthesia:    Anesthesia method:  Local infiltration   Local anesthetic:  Lidocaine  2% WITH epi Procedure type:    Complexity:  Simple Procedure details:    Incision types:  Cruciate   Wound management:  Probed and deloculated   Drainage:  Purulent   Drainage amount:  Copious   Wound treatment:  Wound left open   Packing materials:  None Post-procedure details:    Procedure completion:  Tolerated well, no immediate complications    Medications Ordered in the ED  lidocaine -EPINEPHrine  (XYLOCAINE  W/EPI) 2 %-1:200000 (PF) injection 10 mL (has no administration in time range)  oxyCODONE -acetaminophen  (PERCOCET/ROXICET) 5-325 MG per tablet 2 tablet (2 tablets Oral Given 05/14/24 1325)                                    Medical Decision Making Risk Prescription drug management.   Patient is a 38 y.o. female who presents to the emergency department for a right axilla abscess x 4 days.  She is afebrile, nontoxic-appearing, and in no acute distress reassuring vital signs.  Physical exam: 3 cm x 6 cm area of fluctuance with surrounding induration noted to the right axilla.  No signs of drainage.  Procedure: Abscess was incised and drained per above procedure which was well-tolerated. Area was anesthestized with lidocaine . Patient tolerated procedure well with no immediate complications.   Disposition: Patient is not requiring admission or inpatient treatment further symptoms.  Patient was placed on course of antibiotics already by staff at the wellness center, she has been instructed to have a wound check in 3 days.  We discussed reasons to return to the emergency department, and patient is agreeable to the plan.  Evaluation and diagnostic testing in the emergency department does not suggest an emergent condition requiring admission or immediate intervention beyond what has been performed at this time.  Plan for discharge with close PCP follow-up.  Patient is understanding and amenable with plan, educated on red flag symptoms that would prompt immediate return.  Patient discharged in stable condition.  Final diagnoses:  Abscess of axilla, right    ED Discharge Orders     None     An After Visit Summary was printed and given to the patient.      Bernard Donahoo A, PA-C 05/14/24 1616    Levander Houston, MD 05/15/24 [phone]

## 2024-05-14 NOTE — ED Notes (Signed)
Patient verbalizes understanding of discharge instructions. Opportunity for questioning and answers were provided. Armband removed by staff, pt discharged from ED. Ambulated out to lobby  

## 2024-05-15 ENCOUNTER — Ambulatory Visit

## 2024-05-18 ENCOUNTER — Ambulatory Visit
Admission: RE | Admit: 2024-05-18 | Discharge: 2024-05-18 | Disposition: A | Source: Ambulatory Visit | Attending: Obstetrics and Gynecology

## 2024-05-18 DIAGNOSIS — R102 Pelvic and perineal pain unspecified side: Secondary | ICD-10-CM

## 2024-05-18 DIAGNOSIS — D219 Benign neoplasm of connective and other soft tissue, unspecified: Secondary | ICD-10-CM

## 2024-05-21 ENCOUNTER — Encounter: Payer: Self-pay | Admitting: Obstetrics and Gynecology

## 2024-05-21 DIAGNOSIS — F419 Anxiety disorder, unspecified: Secondary | ICD-10-CM

## 2024-05-23 MED ORDER — DIAZEPAM 5 MG PO TABS: 5.0000 mg | ORAL_TABLET | Freq: Once | ORAL | 0 refills | Status: AC

## 2024-06-05 ENCOUNTER — Other Ambulatory Visit: Payer: Self-pay

## 2024-06-05 ENCOUNTER — Ambulatory Visit

## 2024-06-26 ENCOUNTER — Telehealth: Payer: Self-pay | Admitting: Family Medicine

## 2024-06-26 MED ORDER — FLUCONAZOLE 150 MG PO TABS: 150.0000 mg | ORAL_TABLET | Freq: Once | ORAL | 0 refills | Status: AC

## 2024-06-26 NOTE — Telephone Encounter (Signed)
 Called pt and discussed her symptoms. She states having vaginal irritation and slight irritation. She denies malodorous d/c. I advised that I will send in medication to treat vaginal yeast. If her symptoms persist beyond 3 days or have changed, she will require nurse visit for self swab before any additional treatment can be offered. Pt voiced understanding.

## 2024-06-26 NOTE — Telephone Encounter (Signed)
 Patient called to say she spoke with a nurse, and they told her to call us  after 1:00 pm to say she needs to be seen tomorrow. She said the nurse told her she could probably have a yeast infection. Please call and speak with patient as she is very anxious to be seen.

## 2024-06-29 ENCOUNTER — Ambulatory Visit
Admission: RE | Admit: 2024-06-29 | Discharge: 2024-06-29 | Disposition: A | Source: Ambulatory Visit | Attending: Obstetrics and Gynecology

## 2024-06-29 MED ORDER — GADOPICLENOL 0.5 MMOL/ML IV SOLN
10.0000 mL | Freq: Once | INTRAVENOUS | Status: AC | PRN
  Administered 2024-06-29: 10 mL via INTRAVENOUS

## 2024-07-02 ENCOUNTER — Ambulatory Visit: Payer: Self-pay | Admitting: Family Medicine

## 2024-07-02 ENCOUNTER — Ambulatory Visit: Payer: Self-pay | Admitting: Obstetrics and Gynecology

## 2024-07-02 NOTE — Telephone Encounter (Signed)
Please schedule patient with any available provider. °

## 2024-07-02 NOTE — Telephone Encounter (Signed)
 Called and CAL and advised Maria Nicholson of patient's symptoms and request for antibiotic eye drops.

## 2024-07-02 NOTE — Telephone Encounter (Signed)
 FYI Only or Action Required?: Action required by provider: clinical question for provider, update on patient condition, and request for eyedrops for left eye--yellow sticky drainage x one day.  Patient was last seen in primary care on 05/14/2024 by Vicci Barnie NOVAK, MD.  Called Nurse Triage reporting Eye Drainage.  Symptoms began yesterday.  Interventions attempted: Rest, hydration, or home remedies.  Symptoms are: gradually worsening.  Triage Disposition: Call PCP Within 24 Hours  Patient/caregiver understands and will follow disposition?: Yes                Copied from CRM #[id number]. Topic: Clinical - Red Word Triage >> Jul 02, 2024  8:32 AM Montie POUR wrote: Red Word that prompted transfer to Nurse Triage:  Her left eye is draining yellow mucus and right is draining clear. It started yesterday and she wants to see if her doctor will call in eye drop. Reason for Disposition  [1] Bacterial conjunctivitis suspected (e.g., white, yellow or green discharge nearly all day long; or eyelids stuck shut from discharge after sleep) AND [2] NO doctor standing order to call in antibiotic eye drops  (Exception: [place]; continue triage.)  Answer Assessment - Initial Assessment Questions Patient states that her left eye is draining yellow and irritated This started yesterday Left eye was red and itching This morning the eye was sticky She states that her right eye is normal at this time  Patient wears glasses and states she has been trying to keep this eye cleaned the best she can. No chance of pregnancy Patient does take Zyrtec  as needed for allergies. She states she has indoor and outdoor allergies and has had to get prescription antibiotic eye drops in the past Patient denies any injuries to this eye or any blurry vision, fevers, or pain.  Patient is advised to call us  back if anything changes or with any further questions/concerns. Patient is advised that if anything  worsens to go to the Emergency Room. Patient verbalized understanding.  Protocols used: Eye - Pus or Discharge-A-AH

## 2024-07-03 ENCOUNTER — Encounter: Payer: Self-pay | Admitting: Family Medicine

## 2024-07-03 ENCOUNTER — Ambulatory Visit: Attending: Family Medicine | Admitting: Family Medicine

## 2024-07-03 VITALS — BP 111/75 | HR 82 | Ht 67.0 in | Wt 291.0 lb

## 2024-07-03 DIAGNOSIS — H1012 Acute atopic conjunctivitis, left eye: Secondary | ICD-10-CM | POA: Diagnosis not present

## 2024-07-03 MED ORDER — OLOPATADINE HCL 0.1 % OP SOLN: 1.0000 [drp] | Freq: Two times a day (BID) | OPHTHALMIC | 1 refills | Status: AC

## 2024-07-03 NOTE — Progress Notes (Signed)
 "  Subjective:  Patient ID: Maria Nicholson, female    DOB: 10/15/1985  Age: 39 y.o. MRN: [id number]  CC: Eye Problem (Itchy and runny eyes)     Discussed the use of AI scribe software for clinical note transcription with the patient, who gave verbal consent to proceed.  History of Present Illness Maria Nicholson is a 39 year old female with striae of asthma, eczema, HIV uterine fibroids and iron deficiency anemia  .  She complains of left eye drainage typical of her allergies but no vision abnormalities. The left eye lid was matted but no redness of her eye lid. She uses an allergy pill as needed for seasonal allergies.  Denies presence of fever, body aches or myalgia and she has no facial pressure or nasal congestion.  Past Medical History:  Diagnosis Date   Asthma    Bronchitis    HIV (human immunodeficiency virus infection) (HCC)    No pertinent past medical history     Past Surgical History:  Procedure Laterality Date   DILATION AND CURETTAGE, DIAGNOSTIC / THERAPEUTIC      Family History  Problem Relation Age of Onset   Cancer Maternal Grandmother        throat   Cancer Father 67       spinal    Cancer Maternal Aunt 78       throat    Social History   Socioeconomic History   Marital status: Single    Spouse name: Not on file   Number of children: Not on file   Years of education: Not on file   Highest education level: Not on file  Occupational History   Not on file  Tobacco Use   Smoking status: Former    Current packs/day: 0.15    Average packs/day: 0.2 packs/day for 2.0 years (0.3 ttl pk-yrs)    Types: Cigarettes   Smokeless tobacco: Never   Tobacco comments:    decreased from one pack daily     Vaping Use   Vaping status: Never Used  Substance and Sexual Activity   Alcohol use: Yes    Comment: Occasionally    Drug use: Not Currently    Types: Marijuana   Sexual activity: Yes    Partners: Male    Birth control/protection: None, Condom     Comment: declined condoms  Other Topics Concern   Not on file  Social History Narrative   ** Merged History Encounter **       Social Drivers of Health   Tobacco Use: Medium Risk (07/03/2024)   Patient History    Smoking Tobacco Use: Former    Smokeless Tobacco Use: Never    Passive Exposure: Not on Actuary Strain: High Risk (10/24/2023)   Overall Financial Resource Strain (CARDIA)    Difficulty of Paying Living Expenses: Very hard  Food Insecurity: Food Insecurity Present (04/30/2024)   Epic    Worried About Programme Researcher, Broadcasting/film/video in the Last Year: Sometimes true    Ran Out of Food in the Last Year: Often true  Transportation Needs: No Transportation Needs (10/24/2023)   PRAPARE - Administrator, Civil Service (Medical): No    Lack of Transportation (Non-Medical): No  Physical Activity: Insufficiently Active (10/24/2023)   Exercise Vital Sign    Days of Exercise per Week: 1 day    Minutes of Exercise per Session: 60 min  Stress: No Stress Concern Present (10/24/2023)   [employer]  of Occupational Health - Occupational Stress Questionnaire    Feeling of Stress : Not at all  Social Connections: Moderately Isolated (10/24/2023)   Social Connection and Isolation Panel    Frequency of Communication with Friends and Family: Once a week    Frequency of Social Gatherings with Friends and Family: Never    Attends Religious Services: 1 to 4 times per year    Active Member of Clubs or Organizations: No    Attends Banker Meetings: 1 to 4 times per year    Marital Status: Divorced  Depression (PHQ2-9): High Risk (05/14/2024)   Depression (PHQ2-9)    PHQ-2 Score: 20  Alcohol Screen: Low Risk (10/24/2023)   Alcohol Screen    Last Alcohol Screening Score (AUDIT): 1  Housing: High Risk (10/24/2023)   Housing Stability Vital Sign    Unable to Pay for Housing in the Last Year: No    Number of Times Moved in the Last Year: Not on file    Homeless in the  Last Year: Yes  Utilities: Not At Risk (10/24/2023)   AHC Utilities    Threatened with loss of utilities: No  Health Literacy: Adequate Health Literacy (10/24/2023)   B1300 Health Literacy    Frequency of need for help with medical instructions: Never    Allergies[1]  Outpatient Medications Prior to Visit  Medication Sig Dispense Refill   cetirizine  (ZYRTEC  ALLERGY) 10 MG tablet Take 1 tablet (10 mg total) by mouth daily as needed for allergies or rhinitis. 15 tablet 0   dolutegravir-lamiVUDine  (DOVATO ) 50-300 MG tablet Take 1 tablet by mouth daily. 30 tablet 6   ferrous sulfate  325 (65 FE) MG tablet Take 1 tablet (325 mg total) by mouth 2 (two) times daily with a meal. 180 tablet 1   ibuprofen  (ADVIL ) 600 MG tablet Take 1 tablet (600 mg total) by mouth every 6 (six) hours as needed. 30 tablet 0   metroNIDAZOLE  (METROGEL ) 0.75 % vaginal gel Place 1 Applicatorful vaginally at bedtime. Use for 5 days. (Patient not taking: Reported on 07/03/2024) 55 g 0   sulfamethoxazole -trimethoprim  (BACTRIM  DS) 800-160 MG tablet Take 1 tablet by mouth 2 (two) times daily. (Patient not taking: Reported on 07/03/2024) 14 tablet 0   No facility-administered medications prior to visit.     ROS Review of Systems  Constitutional:  Negative for activity change and appetite change.  HENT:  Negative for sinus pressure and sore throat.   Eyes:  Positive for discharge.  Respiratory:  Negative for chest tightness, shortness of breath and wheezing.   Cardiovascular:  Negative for chest pain and palpitations.  Gastrointestinal:  Negative for abdominal distention, abdominal pain and constipation.  Genitourinary: Negative.   Musculoskeletal: Negative.   Psychiatric/Behavioral:  Negative for behavioral problems and dysphoric mood.     Objective:  BP 111/75   Pulse 82   Ht 5' 7 (1.702 m)   Wt 291 lb (132 kg)   SpO2 95%   BMI 45.58 kg/m      07/03/2024    3:44 PM 05/14/2024   12:23 PM 05/14/2024    9:29 AM   BP/Weight  Systolic BP 111 125 126  Diastolic BP 75 81 79  Wt. (Lbs) 291  293  BMI 45.58 kg/m2  45.89 kg/m2      Physical Exam Constitutional:      Appearance: She is well-developed.  Eyes:     General:        Right eye: No discharge.  Left eye: No discharge.     Conjunctiva/sclera: Conjunctivae normal.  Cardiovascular:     Rate and Rhythm: Normal rate.     Heart sounds: Normal heart sounds. No murmur heard. Pulmonary:     Effort: Pulmonary effort is normal.     Breath sounds: Normal breath sounds. No wheezing or rales.  Chest:     Chest wall: No tenderness.  Abdominal:     General: Bowel sounds are normal. There is no distension.     Palpations: Abdomen is soft. There is no mass.     Tenderness: There is no abdominal tenderness.  Musculoskeletal:        General: Normal range of motion.     Right lower leg: No edema.     Left lower leg: No edema.  Neurological:     Mental Status: She is alert and oriented to person, place, and time.  Psychiatric:        Mood and Affect: Mood normal.        Latest Ref Rng & Units 04/10/2024    2:51 PM 09/19/2023    4:03 AM 02/28/2023    3:54 PM  CMP  Glucose 65 - 99 mg/dL 88  90  99   BUN 7 - 25 mg/dL 11  14  13    Creatinine 0.50 - 0.97 mg/dL 8.95  8.78  8.94   Sodium 135 - 146 mmol/L 135  138  139   Potassium 3.5 - 5.3 mmol/L 4.4  4.3  4.3   Chloride 98 - 110 mmol/L 101  105  104   CO2 20 - 32 mmol/L 25  25  28    Calcium  8.6 - 10.2 mg/dL 9.6  9.4  9.6   Total Protein 6.1 - 8.1 g/dL 7.6  7.9  7.5   Total Bilirubin 0.2 - 1.2 mg/dL 0.3  0.2  0.2   AST 10 - 30 U/L 15  13  13    ALT 6 - 29 U/L 12  11  10      Lipid Panel     Component Value Date/Time   CHOL 151 10/24/2023 0932   TRIG 133 10/24/2023 0932   HDL 50 10/24/2023 0932   CHOLHDL 2.7 10/03/2022 1508   VLDL 12 11/15/2011 1223   LDLCALC 78 10/24/2023 0932   LDLCALC 82 10/03/2022 1508    CBC    Component Value Date/Time   WBC 5.7 04/10/2024 1451   RBC  4.15 04/10/2024 1451   HGB 11.2 (L) 04/10/2024 1451   HCT 35.7 04/10/2024 1451   PLT 481 (H) 04/10/2024 1451   MCV 86.0 04/10/2024 1451   MCH 27.0 04/10/2024 1451   MCHC 31.4 (L) 04/10/2024 1451   RDW 14.8 04/10/2024 1451   LYMPHSABS 1,924 02/28/2023 1554   MONOABS 1.1 (H) 10/07/2018 0050   EOSABS 160 04/10/2024 1451   BASOSABS 91 04/10/2024 1451    Lab Results  Component Value Date   HGBA1C 5.2 10/24/2023       1. Allergic conjunctivitis of left eye (Primary) Examination is normal, No antibiotic indicated - olopatadine  (PATANOL) 0.1 % ophthalmic solution; Place 1 drop into both eyes 2 (two) times daily.  Dispense: 5 mL; Refill: 1    Meds ordered this encounter  Medications   olopatadine  (PATANOL) 0.1 % ophthalmic solution    Sig: Place 1 drop into both eyes 2 (two) times daily.    Dispense:  5 mL    Refill:  1    Follow-up: Return if symptoms  worsen or fail to improve.       Corrina Sabin, MD, FAAFP. [facility] [place], [place] [phone]   07/04/2024, 8:14 AM     [1]  Allergies Allergen Reactions   Amoxicillin Hives    Did it involve swelling of the face/tongue/throat, SOB, or low BP? No Did it involve sudden or severe rash/hives, skin peeling, or any reaction on the inside of your mouth or nose? Yes Did you need to seek medical attention at a hospital or doctor's office? Yes When did it last happen?      38 yrs old If all above answers are NO, may proceed with cephalosporin use.    Coconut Flavoring Agent (Non-Screening) Swelling   Metronidazole  Other (See Comments)    Makes me sick   "

## 2024-07-03 NOTE — Patient Instructions (Signed)
Allergies, Adult An allergy is a condition that causes the body's defense system (immune system) to react too strongly to an allergen. An allergen is a substance that is harmless to most people but can cause a reaction in some people. Allergies often affect the nose (allergic rhinitis), eyes (conjunctivitis), skin (atopic dermatitis), and stomach. They can be mild, moderate, or severe. They cannot spread from person to person. Allergies can start at any age. In some cases, they may go away as you get older. What are the causes? Allergies are caused by allergens. These may be: Outdoor allergens. These include pollen, car fumes, and mold. Indoor allergens. These include dust, smoke, mold, and pet dander. Other allergens. These include foods, medicines, scents, and insect bites or stings. What increases the risk? You are more likely to have allergies if you have: Family members with allergies. Family members who have a condition that may be caused by allergens, such as asthma. What are the signs or symptoms? Symptoms depend on how severe your allergy is. Mild to moderate symptoms Runny nose, stuffy nose (nasal congestion), or sneezing. Itchy mouth, ears, or throat. Postnasal drip. This is a feeling of mucus dripping down the back of your throat. Sore throat. Itchy, red, watery, or puffy eyes. Skin rash, or itchy, red, swollen areas of skin (hives). Stomach cramps or bloating. Severe symptoms A bad allergy to food, medicine, or insect bites may cause a severe reaction (anaphylactic reaction). Symptoms include: A red face. Coughing or making high-pitched whistling sounds when you breathe, most often when you breathe out (wheezing). Swollen lips, tongue, or mouth. A tight or swollen throat. Chest pain or tightness, or a fast heartbeat. Trouble breathing or shortness of breath. Pain in your abdomen. Vomiting or diarrhea. Feeling dizzy or fainting. How is this diagnosed? Allergies are  diagnosed based on your symptoms, your family and medical history, and a physical exam. You may also have tests, such as: Skin tests. These may be done to see how your skin reacts to allergens. Tests include: Skin prick test. For this test, the allergen is put in your body through a small prick in the skin. Intradermal skin test. For this test, a small amount of the allergen is put under the first layer of your skin. Patch test. For this test, a small amount of the allergen is placed on your skin. The area is covered and then checked after a few days. Blood tests. A challenge test. For this test, you eat or breathe in the allergen to see if you have a reaction. You may also be asked to: Keep a food diary. This means writing down all the foods, drinks, and symptoms you have in a day. Try an elimination diet. To do this: Stop eating certain foods. Add those foods back one by one to find out if any of them cause a reaction. How is this treated?     Treatment for allergies depends on your symptoms. It may include: Cold, wet cloths (cold compresses). These can be used to soothe itching and swelling. Eye drops or nasal sprays. A saline solution to clear out your nose and keep it moist (nasal irrigation). A saline solution is made of salt and water. A humidifier. This can add moisture to the air. Skin creams. These can treat rashes or itching. Diet changes to cut out foods that cause allergies. Being exposed again and again to tiny amounts of allergens. This can help your body build a defense against them (tolerance). This process   is called immunotherapy. It may be done using: Allergy shots. This is when you get a shot of the allergen. Sublingual immunotherapy. This is when you take a small dose of the allergen under your tongue. Allergy medicines (antihistamines) or other medicines. These can help block the allergic reaction. Using an auto-injector pen. An auto-injector pen is a device filled  with medicine that gives an emergency shot of epinephrine. Your health care provider will teach you how to use it. Follow these instructions at home: Medicines  Take or apply over-the-counter and prescription medicines only as told by your provider. Always carry your auto-injector pen if you are at risk of an anaphylactic reaction. Give yourself the shot as told by your provider. Eating and drinking Follow instructions from your provider about what you may eat and drink. Drink enough fluid to keep your pee (urine) pale yellow. General instructions Wear a medical alert bracelet or necklace if you have had an anaphylactic reaction in the past. Avoid known allergens when you can. Keep all follow-up visits. Your provider will watch your symptoms and talk about treatment options with you. Contact a health care provider if: Your symptoms do not get better with treatment. Get help right away if: You have any symptoms of anaphylactic reaction. You use an auto-injector pen. You will need more medical care even if the medicine seems to be working. An anaphylactic reaction may happen again within 72 hours (rebound anaphylaxis). These symptoms may be an emergency. Use the auto-injector pen right away. Then call 911. Do not wait to see if the symptoms will go away. Do not drive yourself to the hospital. This information is not intended to replace advice given to you by your health care provider. Make sure you discuss any questions you have with your health care provider. Document Revised: 02/16/2022 Document Reviewed: 02/16/2022 Elsevier Patient Education  2024 Elsevier Inc.  

## 2024-07-04 ENCOUNTER — Encounter: Payer: Self-pay | Admitting: Family Medicine

## 2024-07-11 ENCOUNTER — Ambulatory Visit

## 2024-07-11 ENCOUNTER — Telehealth: Payer: Self-pay

## 2024-07-11 NOTE — Telephone Encounter (Signed)
 Patient called with concerns of small bumps that appeared on her inner thighs about two weeks ago. She now reports a cluster of bumps on her buttocks/hip area that appeared today and describes them as itchy.   Denies use of any new soaps or detergents.   States she googled what this could be and is concerned about an allergic reaction, hives, and/or herpes. She confirmed that she is not experiencing any wheezing, shortness of breath, throat tightness, face/tongue/lip swelling. Discussed this would be indicative of a severe allergic reaction and require care in the ED.   She reports no other symptoms other than localized rash and itching. No available appointments with the clinic tomorrow. Scheduled her with [facility] tomorrow at noon for further evaluation. Patient is agreeable.   Maria Nicholson, BSN, RN

## 2024-07-12 ENCOUNTER — Ambulatory Visit (HOSPITAL_COMMUNITY)
Admission: EM | Admit: 2024-07-12 | Discharge: 2024-07-12 | Disposition: A | Discharge status: Home / Self Care | Attending: Internal Medicine | Admitting: Internal Medicine

## 2024-07-12 ENCOUNTER — Encounter (HOSPITAL_COMMUNITY): Payer: Self-pay | Admitting: Emergency Medicine

## 2024-07-12 ENCOUNTER — Ambulatory Visit (HOSPITAL_COMMUNITY): Payer: Self-pay

## 2024-07-12 DIAGNOSIS — R21 Rash and other nonspecific skin eruption: Secondary | ICD-10-CM | POA: Diagnosis present

## 2024-07-12 MED ORDER — VALACYCLOVIR HCL 1 G PO TABS: 1000.0000 mg | ORAL_TABLET | Freq: Three times a day (TID) | ORAL | 0 refills | Status: DC

## 2024-07-12 MED ORDER — VALACYCLOVIR HCL 1 G PO TABS: 1000.0000 mg | ORAL_TABLET | Freq: Three times a day (TID) | ORAL | 0 refills | Status: AC

## 2024-07-12 NOTE — ED Provider Notes (Signed)
 " [facility]    CSN: [id number] Arrival date & time: 07/12/24  1152      History   Chief Complaint Chief Complaint  Patient presents with   Rash    HPI Maria Nicholson is a 39 y.o. female.   39 year old female presents urgent care with complaints of a rash on the right buttocks.  She first noticed it about 3 days ago.  The area is itchy but not particular painful.  She has not had anything like this in the past although she did note she had a couple of blisters on the front part of her thigh a few weeks ago that have now resolved.  She denies any fevers, chills, cough, congestion, changes in medication.  She does have HIV with a nondetectable viral load.   Rash Associated symptoms: no abdominal pain, no fever, no joint pain, no shortness of breath, no sore throat and not vomiting     Past Medical History:  Diagnosis Date   Asthma    Bronchitis    HIV (human immunodeficiency virus infection) (HCC)    No pertinent past medical history     Patient Active Problem List   Diagnosis Date Noted   Asthma 10/24/2023   Absolute anemia 10/24/2023   Tongue lesion 09/10/2019   Vaginal itching 01/29/2019   Healthcare maintenance 11/27/2018   Fibroids 11/27/2018   Human immunodeficiency virus (HIV) disease (HCC) 11/06/2017   Eczema 11/17/2011    Past Surgical History:  Procedure Laterality Date   DILATION AND CURETTAGE, DIAGNOSTIC / THERAPEUTIC      OB History     Gravida  3   Para  0   Term  0   Preterm  0   AB  3   Living         SAB  2   IAB  1   Ectopic  0   Multiple      Live Births               Home Medications    Prior to Admission medications  Medication Sig Start Date End Date Taking? Authorizing Provider  valACYclovir (VALTREX) 1000 MG tablet Take 1 tablet (1,000 mg total) by mouth 3 (three) times daily for 7 days. 07/12/24 07/19/24 Yes Jerrel Tiberio A, PA-C  cetirizine  (ZYRTEC  ALLERGY) 10 MG tablet Take 1 tablet (10 mg  total) by mouth daily as needed for allergies or rhinitis. 03/17/21   Petrucelli, Samantha R, PA-C  dolutegravir-lamiVUDine  (DOVATO ) 50-300 MG tablet Take 1 tablet by mouth daily. 04/10/24   Dennise Kingsley, MD  ferrous sulfate  325 (65 FE) MG tablet Take 1 tablet (325 mg total) by mouth 2 (two) times daily with a meal. 10/24/23   Delbert Clam, MD  ibuprofen  (ADVIL ) 600 MG tablet Take 1 tablet (600 mg total) by mouth every 6 (six) hours as needed. 05/25/20   Blaise Aleene KIDD, MD  metroNIDAZOLE  (METROGEL ) 0.75 % vaginal gel Place 1 Applicatorful vaginally at bedtime. Use for 5 days. Patient not taking: Reported on 07/03/2024 04/18/24   Dennise Kingsley, MD  olopatadine  (PATANOL) 0.1 % ophthalmic solution Place 1 drop into both eyes 2 (two) times daily. 07/03/24   Newlin, Enobong, MD  sulfamethoxazole -trimethoprim  (BACTRIM  DS) 800-160 MG tablet Take 1 tablet by mouth 2 (two) times daily. Patient not taking: Reported on 07/03/2024 05/14/24   Vicci Barnie NOVAK, MD    Family History Family History  Problem Relation Age of Onset   Cancer Maternal Grandmother  throat   Cancer Father 45       spinal    Cancer Maternal Aunt 62       throat    Social History Social History[1]   Allergies   Amoxicillin, Coconut flavoring agent (non-screening), and Metronidazole    Review of Systems Review of Systems  Constitutional:  Negative for chills and fever.  HENT:  Negative for ear pain and sore throat.   Eyes:  Negative for pain and visual disturbance.  Respiratory:  Negative for cough and shortness of breath.   Cardiovascular:  Negative for chest pain and palpitations.  Gastrointestinal:  Negative for abdominal pain and vomiting.  Genitourinary:  Negative for dysuria and hematuria.  Musculoskeletal:  Negative for arthralgias and back pain.  Skin:  Positive for rash. Negative for color change.  Neurological:  Negative for seizures and syncope.  All other systems reviewed and are  negative.    Physical Exam Triage Vital Signs ED Triage Vitals  Encounter Vitals Group     BP 07/12/24 1220 134/79     Girls Systolic BP Percentile --      Girls Diastolic BP Percentile --      Boys Systolic BP Percentile --      Boys Diastolic BP Percentile --      Pulse Rate 07/12/24 1220 91     Resp 07/12/24 1220 16     Temp 07/12/24 1220 98.8 F (37.1 C)     Temp Source 07/12/24 1220 Oral     SpO2 07/12/24 1220 97 %     Weight 07/12/24 1223 290 lb (131.5 kg)     Height 07/12/24 1223 5' 7 (1.702 m)     Head Circumference --      Peak Flow --      Pain Score 07/12/24 1223 0     Pain Loc --      Pain Education --      Exclude from Growth Chart --    No data found.  Updated Vital Signs BP 134/79 (BP Location: Right Arm)   Pulse 91   Temp 98.8 F (37.1 C) (Oral)   Resp 16   Ht 5' 7 (1.702 m)   Wt 290 lb (131.5 kg)   LMP 06/24/2024 (Exact Date)   SpO2 97%   BMI 45.42 kg/m   Visual Acuity Right Eye Distance:   Left Eye Distance:   Bilateral Distance:    Right Eye Near:   Left Eye Near:    Bilateral Near:     Physical Exam Vitals and nursing note reviewed.  Constitutional:      General: [name]    Appearance: She is well-developed.  HENT:     Head: Normocephalic and atraumatic.  Eyes:     Conjunctiva/sclera: Conjunctivae normal.  Cardiovascular:     Rate and Rhythm: Normal rate.     Heart sounds: No murmur heard. Pulmonary:     Effort: Pulmonary effort is normal. No respiratory distress.  Musculoskeletal:        General: No swelling.  Skin:    General: Skin is warm and dry.     Capillary Refill: Capillary refill takes less than 2 seconds.      Neurological:     Mental Status: She is alert.  Psychiatric:        Mood and Affect: Mood normal.      UC Treatments / Results  Labs (all labs ordered are listed, but only abnormal results are displayed)  Labs Reviewed  HSV 1/2 PCR (SURFACE)    EKG   Radiology No  results found.  Procedures Procedures (including critical care time)  Medications Ordered in UC Medications - No data to display  Initial Impression / Assessment and Plan / UC Course  I have reviewed the triage vital signs and the nursing notes.  Pertinent labs & imaging results that were available during my care of the patient were reviewed by me and considered in my medical decision making (see chart for details).     Rash   Area on the right buttocks is scraped and sampled for HSV.  The area could be HSV versus shingles and this will help distinguish.  Given the presentation we will go ahead and start Valtrex and pending the results of the sample we will make modifications to the treatment and further education if needed. Valtrex 1000 mg 3 times daily for 14 days. Would avoid sexual contact or skin to skin contact within the area of the blisters until the area is completely resolved or until final results of the sample Return to urgent care or PCP if symptoms worsen or fail to resolve.    Final Clinical Impressions(s) / UC Diagnoses   Final diagnoses:  Rash     Discharge Instructions      Area on the right buttocks is scraped and sampled for HSV.  The area could be HSV versus shingles and this will help distinguish.  Given the presentation we will go ahead and start Valtrex and pending the results of the sample we will make modifications to the treatment and further education if needed. Valtrex 1000 mg 3 times daily for 7 days. Would avoid sexual contact or skin to skin contact within the area of the blisters until the area is completely resolved or until final results of the sample Return to urgent care or PCP if symptoms worsen or fail to resolve.      ED Prescriptions     Medication Sig Dispense Auth. Provider   valACYclovir (VALTREX) 1000 MG tablet  (Status: Discontinued) Take 1 tablet (1,000 mg total) by mouth 3 (three) times daily for 14 days. 42 tablet Lilli Dewald,  Arleigh Odowd A, PA-C   valACYclovir (VALTREX) 1000 MG tablet Take 1 tablet (1,000 mg total) by mouth 3 (three) times daily for 7 days. 21 tablet Teresa Almarie LABOR, PA-C      PDMP not reviewed this encounter.    [1]  Social History Tobacco Use   Smoking status: Former    Current packs/day: 0.15    Average packs/day: 0.2 packs/day for 2.0 years (0.3 ttl pk-yrs)    Types: Cigarettes   Smokeless tobacco: Never   Tobacco comments:    decreased from one pack daily     Vaping Use   Vaping status: Never Used  Substance Use Topics   Alcohol use: Yes    Comment: Occasionally    Drug use: Not Currently    Types: Marijuana     Teresa Almarie LABOR, PA-C 07/12/24 1323  "

## 2024-07-12 NOTE — Discharge Instructions (Addendum)
 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  The area could be HSV versus shingles and this will help distinguish.  Given the presentation we will go ahead and start Valtrex and pending the results of the sample we will make modifications to the treatment and further education if needed. Valtrex 1000 mg 3 times daily for 7 days. Would avoid sexual contact or skin to skin contact within the area of the blisters until the area is completely resolved or until final results of the sample Return to urgent care or PCP if symptoms worsen or fail to resolve.

## 2024-07-12 NOTE — ED Triage Notes (Signed)
 Pt c/o rash on right buttock   Onset 2-3 days ago.  C/o itching

## 2024-07-12 NOTE — Telephone Encounter (Signed)
 Thank you :)

## 2024-07-13 LAB — HSV 1/2 PCR (SURFACE)
HSV-1 DNA: NOT DETECTED
HSV-2 DNA: DETECTED — AB

## 2024-07-15 ENCOUNTER — Ambulatory Visit (HOSPITAL_COMMUNITY): Payer: Self-pay

## 2024-08-07 ENCOUNTER — Ambulatory Visit: Admitting: Obstetrics and Gynecology

## 2024-10-09 ENCOUNTER — Ambulatory Visit: Admitting: Internal Medicine

## 2024-10-23 ENCOUNTER — Encounter: Admitting: Family Medicine
